# Patient Record
Sex: Female | Born: 1955 | Race: White | Hispanic: No | Smoking: Current every day smoker
Health system: Southern US, Community
[De-identification: ages and names within clinical notes are randomized; demographics above are authoritative.]

## PROBLEM LIST (undated history)

## (undated) DIAGNOSIS — J449 Chronic obstructive pulmonary disease, unspecified: Secondary | ICD-10-CM

## (undated) DIAGNOSIS — I10 Essential (primary) hypertension: Secondary | ICD-10-CM

## (undated) DIAGNOSIS — B1920 Unspecified viral hepatitis C without hepatic coma: Secondary | ICD-10-CM

## (undated) HISTORY — PX: OTHER SURGICAL HISTORY: SHX169

## (undated) HISTORY — PX: CHOLECYSTECTOMY: SHX55

---

## 2002-10-17 ENCOUNTER — Emergency Department (HOSPITAL_COMMUNITY): Admission: EM | Admit: 2002-10-17 | Discharge: 2002-10-17 | Payer: Self-pay | Admitting: Emergency Medicine

## 2006-11-24 ENCOUNTER — Inpatient Hospital Stay: Payer: Self-pay | Admitting: Internal Medicine

## 2006-11-24 ENCOUNTER — Other Ambulatory Visit: Payer: Self-pay

## 2010-04-01 ENCOUNTER — Ambulatory Visit: Payer: Self-pay | Admitting: Otolaryngology

## 2010-04-04 ENCOUNTER — Ambulatory Visit: Payer: Self-pay | Admitting: Otolaryngology

## 2010-06-12 ENCOUNTER — Ambulatory Visit: Payer: Self-pay | Admitting: Family Medicine

## 2010-06-17 ENCOUNTER — Ambulatory Visit: Payer: Self-pay

## 2010-07-16 ENCOUNTER — Emergency Department (HOSPITAL_COMMUNITY): Admission: EM | Admit: 2010-07-16 | Discharge: 2010-07-16 | Payer: Self-pay | Admitting: Family Medicine

## 2010-07-16 ENCOUNTER — Emergency Department (HOSPITAL_COMMUNITY): Admission: EM | Admit: 2010-07-16 | Discharge: 2010-07-16 | Payer: Self-pay | Admitting: Emergency Medicine

## 2010-11-13 ENCOUNTER — Ambulatory Visit: Payer: Self-pay | Admitting: Pain Medicine

## 2010-11-19 ENCOUNTER — Ambulatory Visit: Payer: Self-pay | Admitting: Pain Medicine

## 2010-12-18 ENCOUNTER — Ambulatory Visit: Payer: Self-pay | Admitting: Pain Medicine

## 2010-12-24 ENCOUNTER — Ambulatory Visit: Payer: Self-pay | Admitting: Pain Medicine

## 2011-01-07 ENCOUNTER — Ambulatory Visit: Payer: Self-pay | Admitting: Pain Medicine

## 2011-01-16 ENCOUNTER — Ambulatory Visit: Payer: Self-pay | Admitting: Pain Medicine

## 2011-02-21 LAB — COMPREHENSIVE METABOLIC PANEL
ALT: 20 U/L (ref 0–35)
AST: 21 U/L (ref 0–37)
Alkaline Phosphatase: 66 U/L (ref 39–117)
CO2: 27 mEq/L (ref 19–32)
Chloride: 104 mEq/L (ref 96–112)
GFR calc Af Amer: >60 mL/min (ref >60)
GFR calc non Af Amer: >60 mL/min (ref >60)
Glucose, Bld: 92 mg/dL (ref 70–99)
Potassium: 3.6 mEq/L (ref 3.5–5.1)
Sodium: 139 mEq/L (ref 135–145)

## 2011-02-21 LAB — RAPID URINE DRUG SCREEN, HOSP PERFORMED
Amphetamines: NOT DETECTED
Barbiturates: NOT DETECTED
Opiates: NOT DETECTED
Tetrahydrocannabinol: NOT DETECTED

## 2011-02-21 LAB — DIFFERENTIAL
Basophils Relative: 0 % (ref 0–1)
Eosinophils Absolute: 0.1 10*3/uL (ref 0.0–0.7)
Eosinophils Relative: 1 % (ref 0–5)
Neutrophils Relative %: 67 % (ref 43–77)

## 2011-02-21 LAB — CBC
HCT: 42.4 % (ref 36.0–46.0)
Hemoglobin: 14.6 g/dL (ref 12.0–15.0)
RBC: 4.43 MIL/uL (ref 3.87–5.11)
WBC: 5.1 10*3/uL (ref 4.0–10.5)

## 2011-02-21 LAB — ETHANOL: Alcohol, Ethyl (B): <5 mg/dL (ref 0–10)

## 2011-05-21 ENCOUNTER — Ambulatory Visit: Payer: Self-pay | Admitting: Pain Medicine

## 2011-05-22 ENCOUNTER — Ambulatory Visit: Payer: Self-pay | Admitting: Pain Medicine

## 2011-06-09 ENCOUNTER — Ambulatory Visit: Payer: Self-pay | Admitting: Pain Medicine

## 2011-07-07 ENCOUNTER — Ambulatory Visit: Payer: Self-pay | Admitting: Pain Medicine

## 2011-07-08 ENCOUNTER — Ambulatory Visit: Payer: Self-pay | Admitting: Pain Medicine

## 2011-08-04 ENCOUNTER — Ambulatory Visit: Payer: Self-pay | Admitting: Pain Medicine

## 2011-09-22 ENCOUNTER — Ambulatory Visit: Payer: Self-pay | Admitting: Pain Medicine

## 2011-09-29 ENCOUNTER — Other Ambulatory Visit: Payer: Self-pay | Admitting: Pain Medicine

## 2011-10-22 ENCOUNTER — Ambulatory Visit: Payer: Self-pay | Admitting: Pain Medicine

## 2011-10-23 ENCOUNTER — Ambulatory Visit: Payer: Self-pay | Admitting: Pain Medicine

## 2011-11-03 ENCOUNTER — Emergency Department: Payer: Self-pay | Admitting: Internal Medicine

## 2011-11-12 ENCOUNTER — Ambulatory Visit: Payer: Self-pay | Admitting: Pain Medicine

## 2011-11-13 ENCOUNTER — Ambulatory Visit: Payer: Self-pay | Admitting: Pain Medicine

## 2012-01-07 ENCOUNTER — Ambulatory Visit: Payer: Self-pay | Admitting: Pain Medicine

## 2012-01-15 ENCOUNTER — Ambulatory Visit: Payer: Self-pay | Admitting: Pain Medicine

## 2012-02-11 ENCOUNTER — Ambulatory Visit: Payer: Self-pay | Admitting: Pain Medicine

## 2012-02-18 ENCOUNTER — Emergency Department: Payer: Self-pay

## 2012-06-12 ENCOUNTER — Emergency Department: Payer: Self-pay | Admitting: Emergency Medicine

## 2012-06-12 LAB — COMPREHENSIVE METABOLIC PANEL
Albumin: 4.3 g/dL (ref 3.4–5.0)
Anion Gap: 5 — ABNORMAL LOW (ref 7–16)
Glucose: 105 mg/dL — ABNORMAL HIGH (ref 65–99)
Potassium: 2.6 mmol/L — ABNORMAL LOW (ref 3.5–5.1)
SGPT (ALT): 26 U/L
Sodium: 137 mmol/L (ref 136–145)
Total Protein: 8.2 g/dL (ref 6.4–8.2)

## 2012-06-12 LAB — TSH: Thyroid Stimulating Horm: 1.3 u[IU]/mL

## 2012-06-12 LAB — SALICYLATE LEVEL: Salicylates, Serum: 3.7 mg/dL — ABNORMAL HIGH

## 2012-06-12 LAB — CBC
MCH: 32.5 pg (ref 26.0–34.0)
RBC: 4.63 10*6/uL (ref 3.80–5.20)

## 2012-06-13 LAB — URINALYSIS, COMPLETE
Bacteria: NONE SEEN
Bilirubin,UR: NEGATIVE
Blood: NEGATIVE
Glucose,UR: NEGATIVE mg/dL (ref 0–75)
Ph: 6 (ref 4.5–8.0)
Squamous Epithelial: 2

## 2012-06-13 LAB — DRUG SCREEN, URINE
Barbiturates, Ur Screen: NEGATIVE (ref ?–200)
Cannabinoid 50 Ng, Ur ~~LOC~~: NEGATIVE (ref ?–50)
Cocaine Metabolite,Ur ~~LOC~~: POSITIVE (ref ?–300)
MDMA (Ecstasy)Ur Screen: NEGATIVE (ref ?–500)
Opiate, Ur Screen: NEGATIVE (ref ?–300)
Tricyclic, Ur Screen: NEGATIVE (ref ?–1000)

## 2012-12-30 ENCOUNTER — Emergency Department: Payer: Self-pay | Admitting: Internal Medicine

## 2012-12-30 LAB — COMPREHENSIVE METABOLIC PANEL
Albumin: 3.6 g/dL (ref 3.4–5.0)
Anion Gap: 5 — ABNORMAL LOW (ref 7–16)
BUN: 7 mg/dL (ref 7–18)
Bilirubin,Total: 0.4 mg/dL (ref 0.2–1.0)
Chloride: 102 mmol/L (ref 98–107)
Creatinine: 0.65 mg/dL (ref 0.60–1.30)
SGPT (ALT): 19 U/L (ref 12–78)
Sodium: 136 mmol/L (ref 136–145)
Total Protein: 7.8 g/dL (ref 6.4–8.2)

## 2012-12-30 LAB — DRUG SCREEN, URINE
Benzodiazepine, Ur Scrn: NEGATIVE (ref ?–200)
Cannabinoid 50 Ng, Ur ~~LOC~~: NEGATIVE (ref ?–50)
Cocaine Metabolite,Ur ~~LOC~~: POSITIVE (ref ?–300)
MDMA (Ecstasy)Ur Screen: NEGATIVE (ref ?–500)
Methadone, Ur Screen: NEGATIVE (ref ?–300)

## 2012-12-30 LAB — URINALYSIS, COMPLETE
Bilirubin,UR: NEGATIVE
Glucose,UR: NEGATIVE mg/dL (ref 0–75)
Leukocyte Esterase: NEGATIVE
Nitrite: NEGATIVE
RBC,UR: 1 /HPF (ref 0–5)
WBC UR: 1 /HPF (ref 0–5)

## 2012-12-30 LAB — TROPONIN I: Troponin-I: <0.02 ng/mL

## 2012-12-30 LAB — CBC
HGB: 14 g/dL (ref 12.0–16.0)
Platelet: 180 10*3/uL (ref 150–440)
WBC: 6 10*3/uL (ref 3.6–11.0)

## 2013-01-24 ENCOUNTER — Ambulatory Visit: Payer: Self-pay | Admitting: Otolaryngology

## 2013-01-25 ENCOUNTER — Ambulatory Visit: Payer: Self-pay | Admitting: Otolaryngology

## 2013-01-27 ENCOUNTER — Ambulatory Visit: Payer: Self-pay | Admitting: Otolaryngology

## 2013-01-27 LAB — DRUG SCREEN, URINE
Benzodiazepine, Ur Scrn: NEGATIVE (ref ?–200)
MDMA (Ecstasy)Ur Screen: POSITIVE (ref ?–500)
Methadone, Ur Screen: NEGATIVE (ref ?–300)
Phencyclidine (PCP) Ur S: NEGATIVE (ref ?–25)
Tricyclic, Ur Screen: NEGATIVE (ref ?–1000)

## 2014-03-13 ENCOUNTER — Inpatient Hospital Stay: Payer: Self-pay | Admitting: Internal Medicine

## 2014-03-13 LAB — COMPREHENSIVE METABOLIC PANEL
ALT: 137 U/L — AB (ref 12–78)
AST: 224 U/L — AB (ref 15–37)
Albumin: 3.3 g/dL — ABNORMAL LOW (ref 3.4–5.0)
Alkaline Phosphatase: 165 U/L — ABNORMAL HIGH
Anion Gap: 3 — ABNORMAL LOW (ref 7–16)
BUN: 7 mg/dL (ref 7–18)
Bilirubin,Total: 0.7 mg/dL (ref 0.2–1.0)
Calcium, Total: 8.3 mg/dL — ABNORMAL LOW (ref 8.5–10.1)
Chloride: 102 mmol/L (ref 98–107)
Co2: 31 mmol/L (ref 21–32)
Creatinine: 0.73 mg/dL (ref 0.60–1.30)
GLUCOSE: 110 mg/dL — AB (ref 65–99)
Osmolality: 271 (ref 275–301)
Potassium: 3.7 mmol/L (ref 3.5–5.1)
Sodium: 136 mmol/L (ref 136–145)
TOTAL PROTEIN: 8.4 g/dL — AB (ref 6.4–8.2)

## 2014-03-13 LAB — URINALYSIS, COMPLETE
BILIRUBIN, UR: NEGATIVE
Glucose,UR: NEGATIVE mg/dL (ref 0–75)
KETONE: NEGATIVE
NITRITE: POSITIVE
Ph: 8 (ref 4.5–8.0)
Protein: NEGATIVE
RBC,UR: 23 /HPF (ref 0–5)
SPECIFIC GRAVITY: 1.012 (ref 1.003–1.030)

## 2014-03-13 LAB — CBC
HCT: 41.7 % (ref 35.0–47.0)
HGB: 13.7 g/dL (ref 12.0–16.0)
MCH: 33.4 pg (ref 26.0–34.0)
MCHC: 32.8 g/dL (ref 32.0–36.0)
MCV: 102 fL — AB (ref 80–100)
Platelet: 100 10*3/uL — ABNORMAL LOW (ref 150–440)
RBC: 4.1 10*6/uL (ref 3.80–5.20)
RDW: 15.1 % — AB (ref 11.5–14.5)
WBC: 4.2 10*3/uL (ref 3.6–11.0)

## 2014-03-13 LAB — AMMONIA: Ammonia, Plasma: 39 mcmol/L — ABNORMAL HIGH (ref 11–32)

## 2014-03-13 LAB — DRUG SCREEN, URINE
AMPHETAMINES, UR SCREEN: NEGATIVE (ref ?–1000)
BARBITURATES, UR SCREEN: NEGATIVE (ref ?–200)
BENZODIAZEPINE, UR SCRN: POSITIVE (ref ?–200)
COCAINE METABOLITE, UR ~~LOC~~: POSITIVE (ref ?–300)
Cannabinoid 50 Ng, Ur ~~LOC~~: NEGATIVE (ref ?–50)
MDMA (ECSTASY) UR SCREEN: NEGATIVE (ref ?–500)
METHADONE, UR SCREEN: NEGATIVE (ref ?–300)
Opiate, Ur Screen: NEGATIVE (ref ?–300)
PHENCYCLIDINE (PCP) UR S: NEGATIVE (ref ?–25)
Tricyclic, Ur Screen: NEGATIVE (ref ?–1000)

## 2014-03-13 LAB — LIPASE, BLOOD: LIPASE: 230 U/L (ref 73–393)

## 2014-03-13 LAB — TROPONIN I: Troponin-I: <0.02 ng/mL

## 2014-03-14 LAB — HEPATIC FUNCTION PANEL A (ARMC)
ALBUMIN: 2.3 g/dL — AB (ref 3.4–5.0)
ALK PHOS: 106 U/L
ALT: 84 U/L — AB (ref 12–78)
AST: 119 U/L — AB (ref 15–37)
BILIRUBIN TOTAL: 0.6 mg/dL (ref 0.2–1.0)
Bilirubin, Direct: 0.2 mg/dL (ref 0.00–0.20)
TOTAL PROTEIN: 5.8 g/dL — AB (ref 6.4–8.2)

## 2014-03-15 LAB — URINE CULTURE

## 2015-03-31 NOTE — H&P (Signed)
PATIENT NAME:  Kylie Monroe, SWANGO MR#:  [id number] DATE OF BIRTH:  08-06-1956  DATE OF ADMISSION:  03/13/2014  PRIMARY CARE PHYSICIAN:  Marguerita Merles, MD  CHIEF COMPLAINT: Abdominal pain.   HISTORY OF PRESENT ILLNESS: This is a 59 year old female who presents to the hospital complaining of lower abdominal pain. She also, on arrival to the ER, was obtunded and lethargic. The patient describes her pain as being achy in nature. She has had it for the past 3 to 4 days. She denies any diarrhea. She admits to some mild nausea and vomiting. She came to the ER and was noted to have a urinary tract infection. A CT scan did show evidence of portal hypertension and liver cirrhosis. The patient is still quite lethargic and is a very poor historian, therefore, most of the history obtained from the chart. Given the fact that she remains quite encephalopathic and was noted to have a urinary tract infection, hospitalist services were contacted for further treatment and evaluation.   REVIEW OF SYSTEMS:    CONSTITUTIONAL: No documented fever. No weight gain, no weight loss.  EYES: No blurred or double vision.  ENT: No tinnitus. No postnasal drip. No redness of the oropharynx.  RESPIRATORY: No cough, no wheeze, no hemoptysis, no dyspnea.  CARDIOVASCULAR: No chest pain. No orthopnea, no palpitations, no syncope.  GASTROINTESTINAL: Positive nausea. Positive vomiting. No diarrhea. Positive abdominal pain. No melena or hematochezia.  GENITOURINARY: No dysuria. No hematuria.  ENDOCRINE: No polyuria or nocturia. No heat or cold intolerance. HEMATOLOGIC:  No anemia, no bruising, no bleeding.  INTEGUMENTARY: No rashes. No lesions.  MUSCULOSKELETAL: No arthritis. No swelling. No gout.  NEUROLOGIC: No numbness or tingling. No ataxia. No seizure-type activity.  PSYCHIATRIC:  No anxiety, no insomnia, no ADD. Positive bipolar disorder.   PAST MEDICAL HISTORY: Consistent with a history of bipolar disorder, cocaine abuse,  history of hep C, COPD, depression, panic attacks, substance abuse, on Suboxone.   ALLERGIES: No known drug allergies.   SOCIAL HISTORY: Still smokes occasionally. Does have a 20 to 30 pack-year smoking history. Does have a history of alcohol abuse, says she quit many years ago. Also has a history of polysubstance abuse like cocaine and marijuana but says she has not done it in quite a while.   FAMILY HISTORY: Mother and father are both deceased. Mother died from throat cancer. She cannot recall what her father died from.   CURRENT MEDICATIONS: Are as follows: Albuterol inhaler 2 puffs 4 times daily as needed, bupropion 300 mg 3 tabs daily, Suboxone 8 mg/2 mg sublingual t.i.d., trazodone 100 mg at bedtime and Trileptal 300 mg b.i.d.   PHYSICAL EXAMINATION: Presently is as follows:  VITAL SIGNS:  Temperature is 98.1, pulse 69, respirations 16, blood pressure 148/80, sats 96% on room air.  GENERAL: She is a very unkempt, lethargic female in bed but in no apparent distress.  HEENT:  She is atraumatic, normocephalic. Her extraocular muscles are intact. Pupils are equal and reactive to light. Sclerae anicteric. No conjunctival injection. No pharyngeal erythema.  NECK: Supple. There is no jugular venous distention. No bruits, no lymphadenopathy, no thyromegaly.  HEART: Regular rate and rhythm. No murmurs. No rubs. No clicks.  LUNGS: Clear to auscultation bilaterally. No rales or rhonchi. No wheezes.  ABDOMEN: Soft, flat, tender diffusely but no rebound or rigidity. Good bowel sounds. No hepatosplenomegaly appreciated.  EXTREMITIES: No evidence of any cyanosis, clubbing or peripheral edema. Has +2 pedal and radial pulses bilaterally.  NEUROLOGICAL: The  patient is alert, awake, and oriented x 2, globally weak, moves all extremities spontaneously. No other focal motor or sensory deficits appreciated bilaterally.  SKIN: Moist and warm with no rashes appreciated.  LYMPHATIC: There is no cervical or  axillary lymphadenopathy.   LABORATORY, DIAGNOSTIC AND RADIOLOGICAL DATA:    1.  Serum glucose 110, BUN 7, creatinine 0.7, sodium 136, potassium 3.7, chloride 102, bicarb 31. LFTs showed an albumin of 3.28, alk phos 165, AST 224, ALT 137, total protein of 8.4. White cell count 4.2, hemoglobin 13.7, hematocrit 41.7, platelet count 100. Troponin less than 0.02. The patient's urinalysis showed positive nitrites, trace leukocyte esterase with 5 white cells and trace bacteria.  2.  The patient did have a CT of the abdomen and pelvis done showing liver shows morphologic changes of cirrhosis, splenomegaly, small amount of ascites, portal venous hypertension, significant dilatation of the intra- and extrahepatic biliary tree. This may be chronic; mildly increased stool throughout the colon.   ASSESSMENT AND PLAN: A 59 year old female with a history of bipolar disorder, cocaine abuse, history of hepatitis C, chronic obstructive pulmonary disease, depression, panic attacks, substance abuse on Suboxone, presents to the hospital due to lower abdominal pain and also noted to be quite lethargic and altered.  1.  Altered mental status/encephalopathy. This is likely metabolic encephalopathy from urinary tract infection combined with questionable underlying substance abuse. For now, we will treat the urinary tract infection with IV ceftriaxone and follow her mental status. She does have a history of alcohol abuse but I will go ahead and check an alcohol level. Also check an ammonia level and a urine drug screen. The patient has a history of substance abuse and very high drug-seeking behavior, therefore, I will not order any pain meds for now.  2.  Abdominal pain and abnormal LFTs. The exact etiology of this is unclear but suspected to be related to underlying hepatitis C. The patient's CT abdomen did show a liver cirrhosis and portal hypertension. Her LFTs are mildly abnormal but her lipase is normal. I will check an alcohol  level even though the patient says she has not been drinking for years. Her LFTs could be chronically elevated due to her hepatitis C.  I will check an ammonia level and hold off giving her any pain meds as she has high drug-seeking behavior and has seen pain management in the past.  3.  Urinary tract infection. Continue IV ceftriaxone, follow urine culture.  4.  History of polysubstance abuse. Continue with the Suboxone.  Check a urine drug screen.  5.  History of chronic obstructive pulmonary disease. No acute exacerbation. Continue p.r.n. DuoNebs.  6.  Bipolar disorder. Continue with her Trileptal, trazodone and Wellbutrin.  7.  CODE STATUS: The patient is a full code.   TIME SPENT: 50 minutes.    ____________________________ Belia Heman. Verdell Carmine, MD vjs:cs D: 03/13/2014 19:09:06 ET T: 03/13/2014 19:30:57 ET JOB#: [id number]  cc: Belia Heman. Verdell Carmine, MD, <Dictator> Henreitta Leber MD ELECTRONICALLY SIGNED 03/19/2014 18:47
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
## 2015-03-31 NOTE — Discharge Summary (Signed)
PATIENT NAME:  Kylie Monroe, Kylie Monroe MR#:  [id number] DATE OF BIRTH:  March 10, 1956  DATE OF ADMISSION:  03/13/2014 DATE OF DISCHARGE:  03/14/2014  ADMITTING PHYSICIAN:  Belia Heman. Verdell Carmine, MD  DISCHARGING PHYSICIAN:  Gladstone Lighter, MD  PRIMARY CARE PHYSICIAN:  Myrle Sheng. Jimmye Norman, [place]:  Psychiatric consultation by Jolanta B. Bary Leriche, MD.  DISCHARGE DIAGNOSES: 1.  Altered mental status, likely toxic metabolic encephalopathy from polysubstance abuse.  2.  Acute on chronic abdominal pain.  3.  Urinary tract infection.  4.  History of opioid abuse, on Suboxone.  5.  Narcotic drug-seeking behavior.  6.  Chronic obstructive pulmonary disease.  7.  Bipolar disorder.  8.  Hypotension while in the hospital.    LABORATORY, DIAGNOSTIC AND RADIOLOGICAL DATA PRIOR TO DISCHARGE:  1.  Urine cultures growing pansensitive E. coli. Urine tox screen positive for cocaine and benzos.   2.  CT of the abdomen and pelvis showing cirrhotic acute changes with splenomegaly, mild ascites, portal venous hypertension changes, dilatation of intrahepatic and extrahepatic biliary tree noted.  3.  ALT 84, AST 119, total bili 0.6, alk phos 106, albumin 2.3.  4.  WBC 4.2, hemoglobin 13.7, hematocrit 41.7, platelet count 100.  5.  Sodium 136, potassium 3.7, chloride 102, bicarb 31, BUN 7, creatinine 0.73, glucose 110 and calcium of 8.3.   BRIEF HOSPITAL COURSE: [name] is a 59 year old Caucasian female with past medical history significant for liver cirrhosis, rectal prolapse status post surgery, who has chronic abdominal pain, history of polysubstance abuse and narcotic drug-seeking behavior, comes with acute on chronic abdominal pain. The patient was also noted to be very lethargic and urine tox screen was positive for cocaine and benzos.  Toxic metabolic encephalopathy. The patient likely had some polysubstance abuse when she came in as ammonia level was okay and urine tox screen was positive  for cocaine and benzos. She is on Suboxone at home. Her mental status was starting to clear up, she was more alert.  She was hypotensive, not sure if it was from infection or from her drug use. Blood pressure improved with fluids. The patient was on Rocephin for her UTI and she was on pain medication, not IV narcotics, for her acute on chronic abdominal pain; however, once the patient woke up she wanted to leave against medical advise. She was evaluated by psychiatrist, who recommended to continue her Suboxone, Trileptal and trazodone for her bipolar and opioid dependence. The patient denied any suicidal thoughts so she is not on any commitment so she left AGAINST MEDICAL ADVICE on March 14, 2014.   ADDITIONAL TIME SPENT: 35 minutes.    ____________________________ Gladstone Lighter, MD rk:cs D: 03/15/2014 14:07:16 ET T: 03/15/2014 14:38:42 ET JOB#: [id number]  cc: Gladstone Lighter, MD, <Dictator> Gladstone Lighter MD ELECTRONICALLY SIGNED 03/16/2014 14:08
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
## 2015-11-06 ENCOUNTER — Encounter: Payer: Self-pay | Admitting: Emergency Medicine

## 2015-11-06 DIAGNOSIS — I10 Essential (primary) hypertension: Secondary | ICD-10-CM | POA: Diagnosis not present

## 2015-11-06 DIAGNOSIS — F17210 Nicotine dependence, cigarettes, uncomplicated: Secondary | ICD-10-CM | POA: Diagnosis not present

## 2015-11-06 DIAGNOSIS — M79672 Pain in left foot: Secondary | ICD-10-CM | POA: Insufficient documentation

## 2015-11-06 LAB — CBC WITH DIFFERENTIAL/PLATELET
BASOS ABS: 0 10*3/uL (ref 0–0.1)
BASOS PCT: 0 %
EOS PCT: 2 %
Eosinophils Absolute: 0 10*3/uL (ref 0–0.7)
HCT: 33.3 % — ABNORMAL LOW (ref 35.0–47.0)
Hemoglobin: 10.8 g/dL — ABNORMAL LOW (ref 12.0–16.0)
LYMPHS PCT: 20 %
Lymphs Abs: 0.7 10*3/uL — ABNORMAL LOW (ref 1.0–3.6)
MCH: 31.2 pg (ref 26.0–34.0)
MCHC: 32.5 g/dL (ref 32.0–36.0)
MCV: 96.1 fL (ref 80.0–100.0)
MONO ABS: 0.3 10*3/uL (ref 0.2–0.9)
Monocytes Relative: 9 %
Neutro Abs: 2.4 10*3/uL (ref 1.4–6.5)
Neutrophils Relative %: 69 %
PLATELETS: 159 10*3/uL (ref 150–440)
RBC: 3.46 MIL/uL — ABNORMAL LOW (ref 3.80–5.20)
RDW: 14.2 % (ref 11.5–14.5)
WBC: 3.4 10*3/uL — ABNORMAL LOW (ref 3.6–11.0)

## 2015-11-06 LAB — COMPREHENSIVE METABOLIC PANEL
ALT: 49 U/L (ref 14–54)
ANION GAP: 5 (ref 5–15)
AST: 127 U/L — AB (ref 15–41)
Albumin: 3.1 g/dL — ABNORMAL LOW (ref 3.5–5.0)
Alkaline Phosphatase: 91 U/L (ref 38–126)
BUN: 11 mg/dL (ref 6–20)
CHLORIDE: 98 mmol/L — AB (ref 101–111)
CO2: 33 mmol/L — ABNORMAL HIGH (ref 22–32)
Calcium: 8.6 mg/dL — ABNORMAL LOW (ref 8.9–10.3)
Creatinine, Ser: 0.75 mg/dL (ref 0.44–1.00)
GFR calc Af Amer: >60 mL/min (ref >60)
Glucose, Bld: 90 mg/dL (ref 65–99)
POTASSIUM: 3.6 mmol/L (ref 3.5–5.1)
Sodium: 136 mmol/L (ref 135–145)
Total Bilirubin: 0.4 mg/dL (ref 0.3–1.2)
Total Protein: 8.1 g/dL (ref 6.5–8.1)

## 2015-11-06 NOTE — ED Notes (Signed)
Left foot pain and swelling.  Onset of symptoms x 3 days.  Denies injury.   Redened area seet to left lateral foot.  Dorsalis Pedis pulse +1 palpable.  + CMS to extremity.

## 2015-11-07 ENCOUNTER — Emergency Department
Admission: EM | Admit: 2015-11-07 | Discharge: 2015-11-07 | Discharge status: Against Medical Advice | Payer: Medicaid Other | Attending: Emergency Medicine | Admitting: Emergency Medicine

## 2015-11-07 HISTORY — DX: Unspecified viral hepatitis C without hepatic coma: B19.20

## 2015-11-07 HISTORY — DX: Chronic obstructive pulmonary disease, unspecified: J44.9

## 2015-11-07 HISTORY — DX: Essential (primary) hypertension: I10

## 2015-11-08 ENCOUNTER — Telehealth: Payer: Self-pay | Admitting: Emergency Medicine
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
## 2015-12-05 ENCOUNTER — Other Ambulatory Visit: Payer: Self-pay | Admitting: Nurse Practitioner

## 2015-12-05 ENCOUNTER — Ambulatory Visit
Admission: RE | Admit: 2015-12-05 | Discharge: 2015-12-05 | Disposition: A | Discharge status: Home / Self Care | Payer: Medicaid Other | Source: Ambulatory Visit | Attending: Nurse Practitioner | Admitting: Nurse Practitioner

## 2015-12-05 DIAGNOSIS — R600 Localized edema: Secondary | ICD-10-CM

## 2015-12-06 ENCOUNTER — Emergency Department
Admission: EM | Admit: 2015-12-06 | Discharge: 2015-12-06 | Discharge status: Against Medical Advice | Payer: Medicaid Other | Attending: Emergency Medicine | Admitting: Emergency Medicine

## 2015-12-06 ENCOUNTER — Emergency Department: Payer: Medicaid Other

## 2015-12-06 DIAGNOSIS — F17210 Nicotine dependence, cigarettes, uncomplicated: Secondary | ICD-10-CM | POA: Diagnosis not present

## 2015-12-06 DIAGNOSIS — R2242 Localized swelling, mass and lump, left lower limb: Secondary | ICD-10-CM | POA: Diagnosis not present

## 2015-12-06 DIAGNOSIS — I10 Essential (primary) hypertension: Secondary | ICD-10-CM | POA: Diagnosis not present

## 2015-12-06 DIAGNOSIS — R079 Chest pain, unspecified: Secondary | ICD-10-CM

## 2015-12-06 LAB — COMPREHENSIVE METABOLIC PANEL
ALBUMIN: 3.1 g/dL — AB (ref 3.5–5.0)
ALT: 57 U/L — ABNORMAL HIGH (ref 14–54)
ANION GAP: 4 — AB (ref 5–15)
AST: 128 U/L — ABNORMAL HIGH (ref 15–41)
Alkaline Phosphatase: 122 U/L (ref 38–126)
BILIRUBIN TOTAL: 0.6 mg/dL (ref 0.3–1.2)
BUN: 14 mg/dL (ref 6–20)
CALCIUM: 8.5 mg/dL — AB (ref 8.9–10.3)
CO2: 27 mmol/L (ref 22–32)
Chloride: 102 mmol/L (ref 101–111)
Creatinine, Ser: 0.78 mg/dL (ref 0.44–1.00)
GFR calc non Af Amer: >60 mL/min (ref >60)
GLUCOSE: 104 mg/dL — AB (ref 65–99)
POTASSIUM: 3.8 mmol/L (ref 3.5–5.1)
SODIUM: 133 mmol/L — AB (ref 135–145)
TOTAL PROTEIN: 8 g/dL (ref 6.5–8.1)

## 2015-12-06 LAB — CBC
HCT: 33.9 % — ABNORMAL LOW (ref 35.0–47.0)
Hemoglobin: 11.2 g/dL — ABNORMAL LOW (ref 12.0–16.0)
MCH: 31.4 pg (ref 26.0–34.0)
MCHC: 33.1 g/dL (ref 32.0–36.0)
MCV: 94.7 fL (ref 80.0–100.0)
Platelets: 91 10*3/uL — ABNORMAL LOW (ref 150–440)
RBC: 3.58 MIL/uL — AB (ref 3.80–5.20)
RDW: 15.7 % — AB (ref 11.5–14.5)
WBC: 3.4 10*3/uL — AB (ref 3.6–11.0)

## 2015-12-06 LAB — TROPONIN I: Troponin I: <0.03 ng/mL (ref <0.031)

## 2015-12-06 MED ORDER — IOHEXOL 350 MG/ML SOLN
100.0000 mL | Freq: Once | INTRAVENOUS | Status: AC | PRN
Start: 2015-12-06 — End: 2015-12-06
  Administered 2015-12-06: 100 mL via INTRAVENOUS

## 2015-12-06 NOTE — ED Notes (Signed)
This RN called from treatment area asking to stop patient at the front desk due to her leaving AMA at this time; patient still has PIV in place. RN to back to locate patient. Patient observed in hall walking towards flex with visitor and primary nurse Erie Noe(Vanessa, RN). Patient cursing and talking about how we (staff) were lazy and stupid; "ya'll cant even read a damn piece of paper". Erie NoeVanessa, RN advised this RN that patient was leaving, however she is refusing to allow PIV to be removed. This RN began walking towards as patient proceeded to push up her sleeve and literally ripped PIV from her arm and throw it in the floor. Patient states, "Since ya'll are so lazy. Here let me help you."  Patient bleeding profusely; blood in floor and running down arm. Patient proceeded to pull the tape and TWD off and threw it in the floor as well. This RN asked patient it she would allow a pressure dressing to be applied. RN applied DSPD, however patient would not allow this RN to apply pressure. Patient continues to curse and berate staff talking about us being "lazy and stupid". RN encouraged patient to either allow this RN to apply pressure, or she would need to apply pressure her self. Patient states, "It will be fine" and she jerks sleeve back down and proceeds to walk away. This RN obtained PDI wipes and cleaned blood in floor and walls; PIV catheter collected and inspected by this RN; catheter found to be fully intact. This RN left incident to dispose of biohazardous material. Patient left with Erie NoeVanessa, Charity fundraiserN. Still cursing as this RN walked away.
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
## 2017-12-17 NOTE — Progress Notes (Deleted)
  Hematology/Oncology Consult note [facility] [phone] [phone]   Patient Care Team: Leanna SatoMiles, Linda M, MD as PCP - General (Family Medicine)  REFERRING PROVIDER:  CHIEF COMPLAINTS/PURPOSE OF CONSULTATION:    HISTORY OF PRESENTING ILLNESS:  Kylie Monroe is a  62 y.o.  female with PMH listed below who was referred to me for evaluation of   Reviewed patient's lab work that was done at her PCP's office. 12/09/2017 WBC 2.1, hemoglobin 7.4 hematocrit 25.8 MCV 84 platelet count 84,000  ANC1.28 lymphocyte 0.5, monocytes 0.3, he also had a CMP done which showed normal creatinine 0.6, bilirubin 0.4.    MEDICAL HISTORY:  Past Medical History:  Diagnosis Date  . COPD (chronic obstructive pulmonary disease) (HCC)   . Hepatitis C   . Hypertension     SURGICAL HISTORY: Past Surgical History:  Procedure Laterality Date  . CHOLECYSTECTOMY    . prolapsed rectum      SOCIAL HISTORY: Social History   Socioeconomic History  . Marital status: Divorced    Spouse name: Not on file  . Number of children: Not on file  . Years of education: Not on file  . Highest education level: Not on file  Social Needs  . Financial resource strain: Not on file  . Food insecurity - worry: Not on file  . Food insecurity - inability: Not on file  . Transportation needs - medical: Not on file  . Transportation needs - non-medical: Not on file  Occupational History  . Not on file  Tobacco Use  . Smoking status: Current Every Day Smoker    Packs/day: 1.00    Types: Cigarettes  . Smokeless tobacco: Never Used  Substance and Sexual Activity  . Alcohol use: No  . Drug use: Yes    Types: Cocaine    Comment: valium  . Sexual activity: Not on file  Other Topics Concern  . Not on file  Social History Narrative  . Not on file    FAMILY HISTORY: No family history on file.  ALLERGIES:  has No Known Allergies.  MEDICATIONS:  No current outpatient  medications on file.   No current facility-administered medications for this visit.      PHYSICAL EXAMINATION: ECOG PERFORMANCE STATUS: {CHL ONC ECOG PS:[id number]} There were no vitals filed for this visit. There were no vitals filed for this visit.  Physical Exam   LABORATORY DATA:  I have reviewed the data as listed Lab Results  Component Value Date   WBC 3.4 (L) 12/06/2015   HGB 11.2 (L) 12/06/2015   HCT 33.9 (L) 12/06/2015   MCV 94.7 12/06/2015   PLT 91 (L) 12/06/2015   No results for input(s): NA, K, CL, CO2, GLUCOSE, BUN, CREATININE, CALCIUM, GFRNONAA, GFRAA, PROT, ALBUMIN, AST, ALT, ALKPHOS, BILITOT, BILIDIR, IBILI in the last 8760 hours.     ASSESSMENT & PLAN:  1. Thrombocytopenia (HCC)      All questions were answered. The patient knows to call the clinic with any problems questions or concerns.  Return of visit:  Thank you for this kind referral and the opportunity to participate in the care of this patient. A copy of today's note is routed to referring provider    Rickard PatienceZhou Dean Goldner, MD, PhD Hematology Oncology [facility] Pager- [phone] 12/17/2017

## 2017-12-18 ENCOUNTER — Inpatient Hospital Stay: Payer: Medicaid Other | Admitting: Oncology

## 2017-12-21 NOTE — Progress Notes (Deleted)
  Hematology/Oncology Consult note [facility] [phone] [phone]   Patient Care Team: Leanna SatoMiles, Linda M, MD as PCP - General (Family Medicine)  REFERRING PROVIDER:  CHIEF COMPLAINTS/PURPOSE OF CONSULTATION:    HISTORY OF PRESENTING ILLNESS:  Kylie Monroe is a  62 y.o.  female with PMH listed below who was referred to me for evaluation of     ROS  MEDICAL HISTORY:  Past Medical History:  Diagnosis Date  . COPD (chronic obstructive pulmonary disease) (HCC)   . Hepatitis C   . Hypertension     SURGICAL HISTORY: Past Surgical History:  Procedure Laterality Date  . CHOLECYSTECTOMY    . prolapsed rectum      SOCIAL HISTORY: Social History   Socioeconomic History  . Marital status: Divorced    Spouse name: Not on file  . Number of children: Not on file  . Years of education: Not on file  . Highest education level: Not on file  Social Needs  . Financial resource strain: Not on file  . Food insecurity - worry: Not on file  . Food insecurity - inability: Not on file  . Transportation needs - medical: Not on file  . Transportation needs - non-medical: Not on file  Occupational History  . Not on file  Tobacco Use  . Smoking status: Current Every Day Smoker    Packs/day: 1.00    Types: Cigarettes  . Smokeless tobacco: Never Used  Substance and Sexual Activity  . Alcohol use: No  . Drug use: Yes    Types: Cocaine    Comment: valium  . Sexual activity: Not on file  Other Topics Concern  . Not on file  Social History Narrative  . Not on file    FAMILY HISTORY: No family history on file.  ALLERGIES:  has No Known Allergies.  MEDICATIONS:  No current outpatient medications on file.   No current facility-administered medications for this visit.      PHYSICAL EXAMINATION: ECOG PERFORMANCE STATUS: {CHL ONC ECOG [id number]} There were no vitals filed for this visit. There were no vitals filed for this  visit.  Physical Exam   LABORATORY DATA:  I have reviewed the data as listed Lab Results  Component Value Date   WBC 3.4 (L) 12/06/2015   HGB 11.2 (L) 12/06/2015   HCT 33.9 (L) 12/06/2015   MCV 94.7 12/06/2015   PLT 91 (L) 12/06/2015   No results for input(s): NA, K, CL, CO2, GLUCOSE, BUN, CREATININE, CALCIUM, GFRNONAA, GFRAA, PROT, ALBUMIN, AST, ALT, ALKPHOS, BILITOT, BILIDIR, IBILI in the last 8760 hours.     ASSESSMENT & PLAN:  No diagnosis found.   All questions were answered. The patient knows to call the clinic with any problems questions or concerns.  Return of visit:  Thank you for this kind referral and the opportunity to participate in the care of this patient. A copy of today's note is routed to referring provider    Rickard PatienceZhou Jurnie Garritano, MD, PhD Hematology Oncology [facility] Pager- [phone] 12/21/2017

## 2017-12-22 ENCOUNTER — Inpatient Hospital Stay: Payer: Medicaid Other | Attending: Oncology | Admitting: Oncology

## 2017-12-22 DIAGNOSIS — R630 Anorexia: Secondary | ICD-10-CM | POA: Insufficient documentation

## 2017-12-22 DIAGNOSIS — R6881 Early satiety: Secondary | ICD-10-CM | POA: Insufficient documentation

## 2017-12-22 DIAGNOSIS — R162 Hepatomegaly with splenomegaly, not elsewhere classified: Secondary | ICD-10-CM | POA: Insufficient documentation

## 2017-12-22 DIAGNOSIS — D61818 Other pancytopenia: Secondary | ICD-10-CM | POA: Insufficient documentation

## 2017-12-22 DIAGNOSIS — K7460 Unspecified cirrhosis of liver: Secondary | ICD-10-CM | POA: Insufficient documentation

## 2017-12-22 DIAGNOSIS — F17210 Nicotine dependence, cigarettes, uncomplicated: Secondary | ICD-10-CM | POA: Insufficient documentation

## 2017-12-22 DIAGNOSIS — Z7982 Long term (current) use of aspirin: Secondary | ICD-10-CM | POA: Insufficient documentation

## 2017-12-22 DIAGNOSIS — Z9049 Acquired absence of other specified parts of digestive tract: Secondary | ICD-10-CM | POA: Insufficient documentation

## 2017-12-22 DIAGNOSIS — B1920 Unspecified viral hepatitis C without hepatic coma: Secondary | ICD-10-CM | POA: Insufficient documentation

## 2017-12-22 DIAGNOSIS — M2550 Pain in unspecified joint: Secondary | ICD-10-CM | POA: Insufficient documentation

## 2017-12-22 DIAGNOSIS — R220 Localized swelling, mass and lump, head: Secondary | ICD-10-CM | POA: Insufficient documentation

## 2017-12-22 DIAGNOSIS — F1490 Cocaine use, unspecified, uncomplicated: Secondary | ICD-10-CM | POA: Insufficient documentation

## 2017-12-22 DIAGNOSIS — J449 Chronic obstructive pulmonary disease, unspecified: Secondary | ICD-10-CM | POA: Insufficient documentation

## 2017-12-22 DIAGNOSIS — R05 Cough: Secondary | ICD-10-CM | POA: Insufficient documentation

## 2017-12-22 DIAGNOSIS — D72819 Decreased white blood cell count, unspecified: Secondary | ICD-10-CM | POA: Insufficient documentation

## 2017-12-22 DIAGNOSIS — R5383 Other fatigue: Secondary | ICD-10-CM | POA: Insufficient documentation

## 2017-12-22 DIAGNOSIS — R432 Parageusia: Secondary | ICD-10-CM | POA: Insufficient documentation

## 2017-12-22 DIAGNOSIS — Z79899 Other long term (current) drug therapy: Secondary | ICD-10-CM | POA: Insufficient documentation

## 2017-12-22 DIAGNOSIS — I10 Essential (primary) hypertension: Secondary | ICD-10-CM | POA: Insufficient documentation

## 2017-12-22 DIAGNOSIS — R0602 Shortness of breath: Secondary | ICD-10-CM | POA: Insufficient documentation

## 2017-12-25 ENCOUNTER — Encounter: Payer: Self-pay | Admitting: Hematology and Oncology

## 2017-12-25 ENCOUNTER — Inpatient Hospital Stay (HOSPITAL_BASED_OUTPATIENT_CLINIC_OR_DEPARTMENT_OTHER): Payer: Medicaid Other | Admitting: Hematology and Oncology

## 2017-12-25 ENCOUNTER — Other Ambulatory Visit: Payer: Self-pay

## 2017-12-25 ENCOUNTER — Inpatient Hospital Stay: Payer: Medicaid Other

## 2017-12-25 VITALS — BP 95/62 | HR 88 | Temp 98.0°F | Wt 152.4 lb

## 2017-12-25 DIAGNOSIS — R220 Localized swelling, mass and lump, head: Secondary | ICD-10-CM | POA: Diagnosis not present

## 2017-12-25 DIAGNOSIS — F1490 Cocaine use, unspecified, uncomplicated: Secondary | ICD-10-CM | POA: Diagnosis not present

## 2017-12-25 DIAGNOSIS — K7460 Unspecified cirrhosis of liver: Secondary | ICD-10-CM

## 2017-12-25 DIAGNOSIS — J449 Chronic obstructive pulmonary disease, unspecified: Secondary | ICD-10-CM

## 2017-12-25 DIAGNOSIS — Z7982 Long term (current) use of aspirin: Secondary | ICD-10-CM | POA: Diagnosis not present

## 2017-12-25 DIAGNOSIS — D72819 Decreased white blood cell count, unspecified: Secondary | ICD-10-CM

## 2017-12-25 DIAGNOSIS — D61818 Other pancytopenia: Secondary | ICD-10-CM

## 2017-12-25 DIAGNOSIS — R6881 Early satiety: Secondary | ICD-10-CM

## 2017-12-25 DIAGNOSIS — R635 Abnormal weight gain: Secondary | ICD-10-CM

## 2017-12-25 DIAGNOSIS — R162 Hepatomegaly with splenomegaly, not elsewhere classified: Secondary | ICD-10-CM | POA: Diagnosis not present

## 2017-12-25 DIAGNOSIS — R432 Parageusia: Secondary | ICD-10-CM

## 2017-12-25 DIAGNOSIS — R5383 Other fatigue: Secondary | ICD-10-CM

## 2017-12-25 DIAGNOSIS — Z9049 Acquired absence of other specified parts of digestive tract: Secondary | ICD-10-CM | POA: Diagnosis not present

## 2017-12-25 DIAGNOSIS — M2550 Pain in unspecified joint: Secondary | ICD-10-CM

## 2017-12-25 DIAGNOSIS — R630 Anorexia: Secondary | ICD-10-CM

## 2017-12-25 DIAGNOSIS — R0602 Shortness of breath: Secondary | ICD-10-CM | POA: Diagnosis not present

## 2017-12-25 DIAGNOSIS — D509 Iron deficiency anemia, unspecified: Secondary | ICD-10-CM

## 2017-12-25 DIAGNOSIS — B1920 Unspecified viral hepatitis C without hepatic coma: Secondary | ICD-10-CM | POA: Diagnosis not present

## 2017-12-25 DIAGNOSIS — R05 Cough: Secondary | ICD-10-CM | POA: Diagnosis not present

## 2017-12-25 DIAGNOSIS — Z79899 Other long term (current) drug therapy: Secondary | ICD-10-CM | POA: Diagnosis not present

## 2017-12-25 DIAGNOSIS — I10 Essential (primary) hypertension: Secondary | ICD-10-CM | POA: Diagnosis not present

## 2017-12-25 DIAGNOSIS — F17210 Nicotine dependence, cigarettes, uncomplicated: Secondary | ICD-10-CM

## 2017-12-25 LAB — CBC WITH DIFFERENTIAL/PLATELET
BASOS ABS: 0.1 10*3/uL (ref 0–0.1)
BASOS PCT: 4 %
EOS PCT: 3 %
Eosinophils Absolute: 0.1 10*3/uL (ref 0–0.7)
HCT: 26.3 % — ABNORMAL LOW (ref 35.0–47.0)
Hemoglobin: 7.9 g/dL — ABNORMAL LOW (ref 12.0–16.0)
Lymphocytes Relative: 22 %
Lymphs Abs: 0.5 10*3/uL — ABNORMAL LOW (ref 1.0–3.6)
MCH: 23.7 pg — ABNORMAL LOW (ref 26.0–34.0)
MCHC: 30.1 g/dL — ABNORMAL LOW (ref 32.0–36.0)
MCV: 78.6 fL — ABNORMAL LOW (ref 80.0–100.0)
MONO ABS: 0.3 10*3/uL (ref 0.2–0.9)
Monocytes Relative: 11 %
Neutro Abs: 1.5 10*3/uL (ref 1.4–6.5)
Neutrophils Relative %: 60 %
PLATELETS: 114 10*3/uL — AB (ref 150–440)
RBC: 3.34 MIL/uL — ABNORMAL LOW (ref 3.80–5.20)
RDW: 21.3 % — AB (ref 11.5–14.5)
WBC: 2.5 10*3/uL — ABNORMAL LOW (ref 3.6–11.0)

## 2017-12-25 LAB — FERRITIN: FERRITIN: 5 ng/mL — AB (ref 11–307)

## 2017-12-25 NOTE — Progress Notes (Signed)
Patient here today as new evaluation regarding pancytopenia.  Referred by Dr Harmon DunMichelle Johnson.

## 2017-12-25 NOTE — Progress Notes (Signed)
[facility] day:  12/25/2017  Chief Complaint: Kylie Monroe is a 62 y.o. female with pancytopenia who is referred by Tammi Klippel, PA in consultation for assessment and management.  HPI:   The patient is unclear when she was diagnosed with hepatitis C.  She has HCV genotype:3.  Initial HCV RNA was2,060,000 IU/ml (6.314 log 10 IU/ml) on 04/08/2017.  Fibroscore on 04/08/2017 was 0.56/F2-bridging.  She started treatment for hepatitis C in mid 11/2017.  She was seen for evaluation on 12/23/2017 in the GI clinic.  She was week 4 of 12 on Epclusa for GT 2b chronic hepatitis C.  Hepatitis C quantitative was < 15 on 12/23/2017.  She has progressive anemia and chronic thrombocytopenia.  She has mild leukopenia.  She denies any new medications or herbal products.  Diet is poor.  She denies any bleeding.  Review of labs: 11/20/1995:  Hematocrit 44.0, hemoglobin 15.4, MCV 96, platelets 258,000, WBC 12,700 with an ANC of 9900. 07/16/2010:  Hematocrit 42.4, hemoglobin 14.6, MCV 95.7, platelets 150,000, WBC 5100 with an ANC of 3400.  ALC 1400. 11/06/2015:  Hematocrit 33.3, hemoglobin 10.8, MCV 96.1, platelets 159,000, WBC 3400 with an ANC of 2400.  ALC 700. 12/06/2015:  Hematocrit 33.9, hemoglobin 11.2, MCV 94.7, platelets 91,000, WBC 3400. 04/08/2017:  Hematocrit 32.4, hemoglobin 10.1, MCV 83, platelets 104,000, WBC 2300 with ANC of 1600.  ALC 500. 12/09/2017:  Hematocrit 25.8, hemoglobin 7.4, MCV 84, platelets 84,000, WBC 2100 with an ANC of 1280.  ALC 500. 12/23/2017:  Hematocrit 25.6, hemoglobin 7.6, MCV 82.3, platelets 90,000, WBC 2200 with an ANC of 1240.  ALC 620.  04/08/2017:  Haptoglobin 45, PT 11.8 (INR1.1), HIV negative, hepatitis B surface antigen negative, hepatitis A IgM negative, hepatitis B core antibody IgM negative, B12 732, folate 13. 12/09/2017:  Creatinine 0.6, calcium 8.2, protein 6.7, albumen 3.0, and normal LFTs. 12/23/2017:   Ferritin 4, iron saturation 6%, TIBC 569 (high), folate 9.4, B12 449.  Retic 1.65%.  Abdominal ultrasound on 05/26/2017 revealed cirrhotic liver morphology with hepatosplenomegaly.  Spleen was 14.7 cm.  Last colonoscopy was 4-5 years ago at [facility].  Patient is scheduled for EGD and colonoscopy on 12/30/2017 with Dr. Vira Agar.  Symptomatically, she describes being "just here".  She notes fatigue.  She denies bleeding; no hematochezia, melena, or vaginal bleeding. LMP was "over 15 years ago". Patient has a poor appetite. She states, "someday I eat and then some I don't eat at all".  She mainly eats "chips, cakes, pies, cereal, and sandwiches".  She notes that she does not eat much meat or green leafy vegetables.  She  states, "I don't taste thing anymore. Things don't taste right".  Dysgeusia has been progressive over the last 6-8 months.   She notes a "mass on my tongue base". Patient is being seen in consult at [facility]. She denies weight loss. Patient has gained a significant amount of weight over the last 6 months. Patient notes early satiety.  She complains of generalized pain rated 5/10 today in clinic.   She has a chronic COPD related cough. She has chronic shortness of breath. Patient has polyarthralgia. She states, "my hands, hips, knees, back, and feet all hurt. They are doing imaging of my LEFT shoulder because they think it is about to come out of socket".   She denies family history of malignancy or blood disorders.  She has a routine mammogram scheduled.    Past Medical History:  Diagnosis Date  .  COPD (chronic obstructive pulmonary disease) ([place])   . Hepatitis C   . Hypertension     Past Surgical History:  Procedure Laterality Date  . CHOLECYSTECTOMY    . prolapsed rectum      Family History  Problem Relation Age of Onset  . Cancer Mother     Social History:  reports that she has been smoking cigarettes.  She has been smoking about 0.50 packs per day. she has never used smokeless  tobacco. She reports that she uses drugs. Drug: Cocaine. She reports that she does not drink alcohol.  She has smoked 1/2 ppd x 40 years.  She previously smoked 2 ppd.  She denies known exposures to radiation on toxins.  She lives in [place]. She is a retired Architect. Patient has 2 children.  Her daughter is deaf.  She lives with her x-fiance.  The patient is alone today.  Allergies: No Known Allergies  Current Medications: Current Outpatient Medications  Medication Sig Dispense Refill  . albuterol (PROAIR HFA) 108 (90 Base) MCG/ACT inhaler Inhale 90 mcg into the lungs as needed.    Marland Kitchen aspirin 81 MG chewable tablet Chew 81 mg by mouth daily.    . bisacodyl (DULCOLAX) 5 MG EC tablet Take 5 mg by mouth daily.    . Buprenorphine HCl-Naloxone HCl (SUBOXONE) 8-2 MG FILM Take 8.5 Film by mouth 3 (three) times daily.    Marland Kitchen gabapentin (NEURONTIN) 300 MG capsule Take 300 mg by mouth daily.    . Sofosbuvir-Velpatasvir (EPCLUSA) 400-100 MG TABS Take 400 mg by mouth daily.    Marland Kitchen FLOVENT HFA 220 MCG/ACT inhaler Inhale 220 mcg into the lungs 2 (two) times daily.  5   No current facility-administered medications for this visit.     Review of Systems:  GENERAL:  Feels good.  Active.  No fevers or sweats.  Weight gain of 35-40 pounds in past 6-8 months. PERFORMANCE STATUS (ECOG):  1-2. HEENT:  Base of tongue mass.  Polyp on vocal cord.  No visual changes, runny nose, sore throat, mouth sores or tenderness. Lungs:  COPD.  No shortness of breath or cough.  No hemoptysis.  Nebulized treatments QOD. Cardiac:  No chest pain, palpitations, orthopnea, or PND. GI:  Early satiety.  No nausea, vomiting, diarrhea, constipation, melena or hematochezia. GU:  No urgency, frequency, dysuria, or hematuria. Musculoskeletal:  Back pain.  h/o fractured hip.  Arthritis.  No muscle tenderness. Extremities:  No pain or swelling. Skin:  No rashes or skin changes. Neuro:  No headache, numbness or  weakness, balance or coordination issues. Endocrine:  No diabetes, thyroid issues, hot flashes or night sweats. Psych:  No mood changes, depression or anxiety. Pain:  5/10 - generalized (hip, back, hands and feet). Review of systems:  All other systems reviewed and found to be negative.  Physical Exam: Blood pressure 95/62, pulse 88, temperature 98 F (36.7 C), temperature source Tympanic, weight 152 lb 7 oz (69.1 kg), SpO2 94 %. GENERAL:  Well developed, well nourished, woman sitting comfortably in a wheelchair in the exam room in no acute distress. MENTAL STATUS:  Alert and oriented to person, place and time. HEAD:  Long brown hair.  Sunglasses propped on head.  Normocephalic, atraumatic, face symmetric, no Cushingoid features. EYES:  Blue eyes.  Pupils equal round and reactive to light and accomodation.  No conjunctivitis or scleral icterus. ENT:  Oropharynx clear without lesion.  Tongue normal.  No teeth.  Mucous membranes moist.  RESPIRATORY:  Clear to auscultation without rales, wheezes or rhonchi. CARDIOVASCULAR:  Systolic flow murmur. Regular rate and rhythm without rub or gallop. ABDOMEN:  Diffuse mild abdominal tenderness. Palpable hepatosplenomegaly. Soft with active bowel sounds,  No masses. SKIN:  Pale.  Suprapubic tattoo.  No rashes, ulcers or lesions. EXTREMITIES: No edema, no skin discoloration or tenderness.  No palpable cords. LYMPH NODES: No palpable cervical, supraclavicular, axillary or inguinal adenopathy  NEUROLOGICAL: Unremarkable. PSYCH:  Appropriate.   No visits with results within 3 Day(s) from this visit.  Latest known visit with results is:  Admission on 12/06/2015, Discharged on 12/06/2015  Component Date Value Ref Range Status  . WBC 12/06/2015 3.4* 3.6 - 11.0 K/uL Final  . RBC 12/06/2015 3.58* 3.80 - 5.20 MIL/uL Final  . Hemoglobin 12/06/2015 11.2* 12.0 - 16.0 g/dL Final  . HCT 12/06/2015 33.9* 35.0 - 47.0 % Final  . MCV 12/06/2015 94.7  80.0 - 100.0 fL  Final  . MCH 12/06/2015 31.4  26.0 - 34.0 pg Final  . MCHC 12/06/2015 33.1  32.0 - 36.0 g/dL Final  . RDW 12/06/2015 15.7* 11.5 - 14.5 % Final  . Platelets 12/06/2015 91* 150 - 440 K/uL Final  . Sodium 12/06/2015 133* 135 - 145 mmol/L Final  . Potassium 12/06/2015 3.8  3.5 - 5.1 mmol/L Final  . Chloride 12/06/2015 102  101 - 111 mmol/L Final  . CO2 12/06/2015 27  22 - 32 mmol/L Final  . Glucose, Bld 12/06/2015 104* 65 - 99 mg/dL Final  . BUN 12/06/2015 14  6 - 20 mg/dL Final  . Creatinine, Ser 12/06/2015 0.78  0.44 - 1.00 mg/dL Final  . Calcium 12/06/2015 8.5* 8.9 - 10.3 mg/dL Final  . Total Protein 12/06/2015 8.0  6.5 - 8.1 g/dL Final  . Albumin 12/06/2015 3.1* 3.5 - 5.0 g/dL Final  . AST 12/06/2015 128* 15 - 41 U/L Final  . ALT 12/06/2015 57* 14 - 54 U/L Final  . Alkaline Phosphatase 12/06/2015 122  38 - 126 U/L Final  . Total Bilirubin 12/06/2015 0.6  0.3 - 1.2 mg/dL Final  . GFR calc non Af Amer 12/06/2015 >60  >60 mL/min Final  . GFR calc Af Amer 12/06/2015 >60  >60 mL/min Final   Comment: (NOTE) The eGFR has been calculated using the CKD EPI equation. This calculation has not been validated in all clinical situations. eGFR's persistently <60 mL/min signify possible Chronic Kidney Disease.   . Anion gap 12/06/2015 4* 5 - 15 Final  . Troponin I 12/06/2015 <0.03  <0.031 ng/mL Final   Comment:        NO INDICATION OF MYOCARDIAL INJURY.     Assessment:  Kylie Monroe is a 62 y.o. female with progressive anemia over the past 8 months.  Hemoglobin has decreased from 10.1 to 7.6. She has chronic thrombocytopenia.  Platelet count has been < 100,000 since 11/2015. She has mild leukopenia.  WBC has been 2100 -2300 in the past 8 months.  She denies any new medications or herbal products.  Diet is poor.  She denies any bleeding.  Last colonoscopy was 4-5 years ago at [facility].    She has hepatitis C.  She has HCV genotype:3.  Initial HCV RNA was2,060,000 IU/ml (6.314 log 10 IU/ml) on  04/08/2017.  Fibroscore on 04/08/2017 was 0.56/F2-bridging.  She is week 4 of 12 on Epclusa for GT 2b chronic hepatitis C.  Hepatitis C quantitative was < 15 on 12/23/2017.  Outside work-up revealed the following normal  studies:  haptoglobin, HIV negative, hepatitis B surface antigen, hepatitis A IgM, hepatitis B core antibody IgM, B12, folate, and creatine.  Labs on 12/23/2017 confirmed severe iron deficiency anemia.  Ferritin was 4, iron saturation 6%, TIBC 569 (high).  Retic was 1.65% (inappropriately low).  Abdominal ultrasound on 05/26/2017 revealed cirrhotic liver morphology with hepatosplenomegaly.  Spleen was 14.7 cm.  She notes a "mass on my tongue base". Patient is being seen in consult at [facility].  She has COPD.  Symptomatically, she is fatigued.  She notes dysgeusia.  She has gained 35-40 pounds in the past 6-8 months.  She has diffuse aches.  Plan: 1.  Discuss pancytopenia secondary to underlying liver disease and splenomegaly.  Discuss iron deficiency anemia. 2.  Labs today:  CBC with diff, ANA with reflex, hepatitis B core antibody total, copper level, ferritin. 3.  Peripheral smear for path review. 4.  Contact Tammi Klippel, [place] re: additional labs [phone]) - done 5.  Discuss iron deficiency and iron supplementation. Ferritin was 4.  Will proceed with IV replacement.  Patient in agreement.  Schedule weekly Venofer 200 mg x 4. Start 12/28/2017. 6.  Preauth Venofer - done.   7.  Refer to low dose chest CT cancer screening program.  Note sent to Burgess Estelle, RN.  8.  Follow-up EGD and colonoscopy on 12/30/2017 with Dr. Vira Agar. 9.  Follow-up ENT work-up re: base of tongue mass. 10.  RTC in 1 month for MD assessment and labs (CBC with diff, ferritin, TSH, RF).    Honor Loh, NP  12/25/2017, 3:48 PM   I saw and evaluated the patient, participating in the key portions of the service and reviewing pertinent diagnostic studies and records.  I reviewed the nurse practitioner's note  and agree with the findings and the plan.  The assessment and plan were discussed with the patient.  Multiple questions were asked by the patient and answered.   Nolon Stalls, MD 12/25/2017, 3:48 PM

## 2017-12-26 ENCOUNTER — Encounter: Payer: Self-pay | Admitting: Hematology and Oncology

## 2017-12-26 LAB — ANA W/REFLEX: Anti Nuclear Antibody(ANA): NEGATIVE

## 2017-12-26 LAB — COPPER, SERUM: Copper: 89 ug/dL (ref 72–166)

## 2017-12-26 LAB — HEPATITIS B CORE ANTIBODY, TOTAL: Hep B Core Total Ab: POSITIVE — AB

## 2017-12-28 ENCOUNTER — Encounter: Payer: Self-pay | Admitting: Oncology

## 2017-12-28 ENCOUNTER — Inpatient Hospital Stay: Payer: Medicaid Other

## 2017-12-28 VITALS — BP 101/69 | HR 88 | Temp 98.0°F

## 2017-12-28 DIAGNOSIS — D61818 Other pancytopenia: Secondary | ICD-10-CM | POA: Diagnosis not present

## 2017-12-28 DIAGNOSIS — D509 Iron deficiency anemia, unspecified: Secondary | ICD-10-CM

## 2017-12-28 LAB — PATHOLOGIST SMEAR REVIEW

## 2017-12-28 MED ORDER — IRON SUCROSE 20 MG/ML IV SOLN
200.0000 mg | Freq: Once | INTRAVENOUS | Status: AC
  Administered 2017-12-28: 200 mg via INTRAVENOUS
  Filled 2017-12-28: qty 10

## 2017-12-28 MED ORDER — SODIUM CHLORIDE 0.9 % IV SOLN
Freq: Once | INTRAVENOUS | Status: AC
  Administered 2017-12-28: 15:00:00 via INTRAVENOUS
  Filled 2017-12-28: qty 1000

## 2017-12-29 ENCOUNTER — Telehealth: Payer: Self-pay | Admitting: *Deleted

## 2017-12-29 DIAGNOSIS — Z87891 Personal history of nicotine dependence: Secondary | ICD-10-CM

## 2017-12-29 DIAGNOSIS — Z122 Encounter for screening for malignant neoplasm of respiratory organs: Secondary | ICD-10-CM

## 2017-12-29 NOTE — Telephone Encounter (Signed)
Received referral for initial lung cancer screening scan. Contacted patient and obtained smoking history,(current, 44 pack year) as well as answering questions related to screening process. Patient denies signs of lung cancer such as weight loss or hemoptysis. Patient denies comorbidity that would prevent curative treatment if lung cancer were found. Patient is scheduled for shared decision making visit and CT scan on 01/14/18.

## 2017-12-30 ENCOUNTER — Ambulatory Visit: Admit: 2017-12-30 | Payer: Medicaid Other | Admitting: Unknown Physician Specialty

## 2017-12-30 SURGERY — COLONOSCOPY WITH PROPOFOL
Anesthesia: General

## 2018-01-04 ENCOUNTER — Inpatient Hospital Stay: Payer: Medicaid Other

## 2018-01-11 ENCOUNTER — Inpatient Hospital Stay: Payer: Medicaid Other | Attending: Nurse Practitioner

## 2018-01-14 ENCOUNTER — Inpatient Hospital Stay: Payer: Medicaid Other | Admitting: Nurse Practitioner

## 2018-01-14 ENCOUNTER — Ambulatory Visit: Admission: RE | Admit: 2018-01-14 | Payer: Medicaid Other | Source: Ambulatory Visit

## 2018-01-14 ENCOUNTER — Encounter: Payer: Self-pay | Admitting: Nurse Practitioner

## 2018-01-18 ENCOUNTER — Inpatient Hospital Stay: Payer: Medicaid Other

## 2018-01-24 DIAGNOSIS — D72819 Decreased white blood cell count, unspecified: Secondary | ICD-10-CM | POA: Insufficient documentation

## 2018-01-24 DIAGNOSIS — D696 Thrombocytopenia, unspecified: Secondary | ICD-10-CM | POA: Insufficient documentation

## 2018-01-24 NOTE — Progress Notes (Deleted)
[facility]  Clinic day:  01/24/2018  Chief Complaint: Kylie Monroe is a 62 y.o. female with hepatitis C and pancytopenia who is seen for review of work-up and discussion regarding direction of therapy.  HPI:   The patient was last seen in the hematology clinic on 12/25/2017 for initial consultation.  She was noted to have progressive anemia over the past 8 months.  Hemoglobin had decreased from 10.1 to 7.6. She had chronic thrombocytopenia.  Platelet count had been < 100,000 since 11/2015. She had mild leukopenia.  WBC had been 2100 -2300 in the past 8 months.  She denied any new medications or herbal products.  Diet was poor.  She denied any bleeding.   Outside labs revealed severe iron deficiency anemia.  Ferritin was 4.  She was scheduled for weekly Venofer x 4.  She received Venofer on 12/28/2017.  She underwent a work-up.  CBC revealed a hematocrit of 26.6, hemoglobin 7.9, MCV 78.6, platelets 114,000, WBC 2500 with an ANC of 1500.  Differential included 60% segs, 22% lymphs, 11% monocytes, 3% eosinophils, and 4% basophils.  Peripheral smear revealed pancytopenia.  Morphology was normal.  Copper was 89.  ANA was negative.  Hepatitis B core antibody was positive (GI notified).  Ferritin was 5.    She was scheduled for EGD and colonoscopy on 12/30/2017- cancelled.  She was scheduled for ENT evaluation for a base of tongue mass.  During the interim,    Past Medical History:  Diagnosis Date  . COPD (chronic obstructive pulmonary disease) (HCC)   . Hepatitis C   . Hypertension     Past Surgical History:  Procedure Laterality Date  . CHOLECYSTECTOMY    . prolapsed rectum      Family History  Problem Relation Age of Onset  . Cancer Mother     Social History:  reports that she has been smoking cigarettes.  She has a 44.00 pack-year smoking history. she has never used smokeless tobacco. She reports that she uses drugs. Drug: Cocaine. She reports  that she does not drink alcohol.  She has smoked 1/2 ppd x 40 years.  She previously smoked 2 ppd.  She denies known exposures to radiation on toxins.  She lives in [place]. She is a retired Arboriculturist. Patient has 2 children.  Her daughter is deaf.  She lives with her x-fiance.  The patient is alone today.  Allergies: No Known Allergies  Current Medications: Current Outpatient Medications  Medication Sig Dispense Refill  . albuterol (PROAIR HFA) 108 (90 Base) MCG/ACT inhaler Inhale 90 mcg into the lungs as needed.    Marland Kitchen aspirin 81 MG chewable tablet Chew 81 mg by mouth daily.    . bisacodyl (DULCOLAX) 5 MG EC tablet Take 5 mg by mouth daily.    . Buprenorphine HCl-Naloxone HCl (SUBOXONE) 8-2 MG FILM Take 8.5 Film by mouth 3 (three) times daily.    Marland Kitchen FLOVENT HFA 220 MCG/ACT inhaler Inhale 220 mcg into the lungs 2 (two) times daily.  5  . gabapentin (NEURONTIN) 300 MG capsule Take 300 mg by mouth daily.     No current facility-administered medications for this visit.     Review of Systems:  GENERAL:  Feels good.  Active.  No fevers or sweats.  Weight gain of 35-40 pounds in past 6-8 months. PERFORMANCE STATUS (ECOG):  1-2. HEENT:  Base of tongue mass.  Polyp on vocal cord.  No visual changes, runny  nose, sore throat, mouth sores or tenderness. Lungs:  COPD.  No shortness of breath or cough.  No hemoptysis.  Nebulized treatments QOD. Cardiac:  No chest pain, palpitations, orthopnea, or PND. GI:  Early satiety.  No nausea, vomiting, diarrhea, constipation, melena or hematochezia. GU:  No urgency, frequency, dysuria, or hematuria. Musculoskeletal:  Back pain.  h/o fractured hip.  Arthritis.  No muscle tenderness. Extremities:  No pain or swelling. Skin:  No rashes or skin changes. Neuro:  No headache, numbness or weakness, balance or coordination issues. Endocrine:  No diabetes, thyroid issues, hot flashes or night sweats. Psych:  No mood changes, depression or  anxiety. Pain:  5/10 - generalized (hip, back, hands and feet). Review of systems:  All other systems reviewed and found to be negative.  Physical Exam: There were no vitals taken for this visit. GENERAL:  Well developed, well nourished, woman sitting comfortably in a wheelchair in the exam room in no acute distress. MENTAL STATUS:  Alert and oriented to person, place and time. HEAD:  Long brown hair.  Sunglasses propped on head.  Normocephalic, atraumatic, face symmetric, no Cushingoid features. EYES:  Blue eyes.  Pupils equal round and reactive to light and accomodation.  No conjunctivitis or scleral icterus. ENT:  Oropharynx clear without lesion.  Tongue normal.  No teeth.  Mucous membranes moist.  RESPIRATORY:  Clear to auscultation without rales, wheezes or rhonchi. CARDIOVASCULAR:  Systolic flow murmur. Regular rate and rhythm without rub or gallop. ABDOMEN:  Diffuse mild abdominal tenderness. Palpable hepatosplenomegaly. Soft with active bowel sounds,  No masses. SKIN:  Pale.  Suprapubic tattoo.  No rashes, ulcers or lesions. EXTREMITIES: No edema, no skin discoloration or tenderness.  No palpable cords. LYMPH NODES: No palpable cervical, supraclavicular, axillary or inguinal adenopathy  NEUROLOGICAL: Unremarkable. PSYCH:  Appropriate.   No visits with results within 3 Day(s) from this visit.  Latest known visit with results is:  Appointment on 12/25/2017  Component Date Value Ref Range Status  . Copper 12/25/2017 89  72 - 166 ug/dL Final   Comment: (NOTE)                                Detection Limit = 5 Performed At: [facility] [address] [place], [place] [id number] Jolene Schimke MD NW:[id number] Performed at [facility], [address], [place], [place] [address]   . Anit Nuclear Antibody(ANA) 12/25/2017 Negative  Negative Final   Comment: (NOTE) Performed At: [facility] [address] [place], [place] [id number] Jolene Schimke MD  LK:[id number] Performed at [facility], [address], [place], [place] [address]   . Hep B Core Total Ab 12/25/2017 Positive* Negative Final   Comment: (NOTE) Performed At: [facility] [address] [place], [place] [id number] Jolene Schimke MD [id number] Performed at [facility], [address], [place], [place] [address]   . Ferritin 12/25/2017 5* 11 - 307 ng/mL Final   Performed at [facility], [address] [place], [place] [address]  . Path Review 12/25/2017 Smear review shows pancytopenia. Morphology is normal.  Dr. Excell Seltzer.   Final   Performed at [facility], [address], [place], [place] [address]  . WBC 12/25/2017 2.5* 3.6 - 11.0 K/uL Final  . RBC 12/25/2017 3.34* 3.80 - 5.20 MIL/uL Final  . Hemoglobin 12/25/2017 7.9* 12.0 - 16.0 g/dL Final  . HCT 93/23/5573 26.3* 35.0 - 47.0 %  Final  . MCV 12/25/2017 78.6* 80.0 - 100.0 fL Final  . MCH 12/25/2017 23.7* 26.0 - 34.0 pg Final  . MCHC 12/25/2017 30.1* 32.0 - 36.0 g/dL Final  . RDW 16/09/9603 21.3* 11.5 - 14.5 % Final  . Platelets 12/25/2017 114* 150 - 440 K/uL Final  . Neutrophils Relative % 12/25/2017 60  % Final  . Neutro Abs 12/25/2017 1.5  1.4 - 6.5 K/uL Final  . Lymphocytes Relative 12/25/2017 22  % Final  . Lymphs Abs 12/25/2017 0.5* 1.0 - 3.6 K/uL Final  . Monocytes Relative 12/25/2017 11  % Final  . Monocytes Absolute 12/25/2017 0.3  0.2 - 0.9 K/uL Final  . Eosinophils Relative 12/25/2017 3  % Final  . Eosinophils Absolute 12/25/2017 0.1  0 - 0.7 K/uL Final  . Basophils Relative 12/25/2017 4  % Final  . Basophils Absolute 12/25/2017 0.1  0 - 0.1 K/uL Final   Performed at [facility], [address] [place], [place] [address]    Assessment:  CHERISA BRUCKER is a 62 y.o. female with progressive anemia over the past 8 months.  Hemoglobin has decreased from 10.1 to 7.6. She has chronic thrombocytopenia.  Platelet count has been < 100,000 since 11/2015.  She has mild leukopenia.  WBC has been 2100 -2300 in the past 8 months.  She denies any new medications or herbal products.  Diet is poor.  She denies any bleeding.  Last colonoscopy was 4-5 years ago at [facility].    She has hepatitis C.  She has HCV genotype:3.  Initial HCV RNA was2,060,000 IU/ml (6.314 log 10 IU/ml) on 04/08/2017.  Fibroscore on 04/08/2017 was 0.56/F2-bridging.  She is week 4 of 12 on Epclusa for GT 2b chronic hepatitis C.  Hepatitis C quantitative was < 15 on 12/23/2017.  Outside work-up revealed the following normal studies:  haptoglobin, HIV negative, hepatitis B surface antigen, hepatitis A IgM, hepatitis B core antibody IgM, B12, folate, and creatine.  Labs on 12/23/2017 confirmed severe iron deficiency anemia.  Ferritin was 4, iron saturation 6%, TIBC 569 (high).  Retic was 1.65% (inappropriately low).  Work-up on 12/25/2017 revealed a hematocrit of 26.6, hemoglobin 7.9, MCV 78.6, platelets 114,000, WBC 2500 with an ANC of 1500.  Differential included 60% segs, 22% lymphs, 11% monocytes, 3% eosinophils, and 4% basophils.  Peripheral smear revealed pancytopenia.  Morphology was normal.  Copper was 89.  ANA was negative.  Hepatitis B core antibody was positive (GI notified).  Ferritin was 5.    She received Venofer on 12/28/2017.  Ferritin has been followed: 5 on 12/25/2017.  Abdominal ultrasound on 05/26/2017 revealed cirrhotic liver morphology with hepatosplenomegaly.  Spleen was 14.7 cm.  She notes a "mass on my tongue base". Patient is being seen in consult at [facility].  She has COPD.  Symptomatically, she is fatigued.  She notes dysgeusia.  She has gained 35-40 pounds in the past 6-8 months.  She has diffuse aches.  Plan: 1.  Labs today:  CBC with diff, ferritin, TSH, RF. 2.  Review initial work-up.  Patient has severe iron deficiency.  Patient has a past history of hepatitis B.  Suspect mild leukopenia is secondary to underlying liver disease and splenomegaly.   3.  Venofer  today.   5.  Discuss iron deficiency and iron supplementation. Ferritin was 4.  Will proceed with IV replacement.  Patient in agreement.  Schedule weekly Venofer 200 mg x 4. Start 12/28/2017. 6.  Preauth Venofer - done.   7.  Refer to low dose chest CT cancer screening program.  Note sent to Glenna FellowsShawn Perkins, RN.  8.  Follow-up EGD and colonoscopy on 12/30/2017 with Dr. Mechele CollinElliott. 9.  Follow-up ENT work-up re: base of tongue mass. 10.  RTC in 1 month for MD assessment and labs (CBC with diff, ferritin, TSH, RF).    Rosey BathMelissa C Corcoran, MD  01/24/2018, 9:08 PM   I saw and evaluated the patient, participating in the key portions of the service and reviewing pertinent diagnostic studies and records.  I reviewed the nurse practitioner's note and agree with the findings and the plan.  The assessment and plan were discussed with the patient.  Multiple questions were asked by the patient and answered.   Nelva NayMelissa Corcoran, MD 01/24/2018, 9:08 PM

## 2018-01-25 ENCOUNTER — Inpatient Hospital Stay: Payer: Medicaid Other

## 2018-01-25 ENCOUNTER — Inpatient Hospital Stay: Payer: Medicaid Other | Admitting: Hematology and Oncology

## 2018-02-04 ENCOUNTER — Encounter: Payer: Self-pay | Admitting: *Deleted

## 2018-09-13 ENCOUNTER — Other Ambulatory Visit: Payer: Self-pay | Admitting: Registered Nurse

## 2018-09-13 DIAGNOSIS — B1920 Unspecified viral hepatitis C without hepatic coma: Secondary | ICD-10-CM

## 2018-09-13 DIAGNOSIS — K7460 Unspecified cirrhosis of liver: Secondary | ICD-10-CM

## 2018-09-17 ENCOUNTER — Ambulatory Visit: Payer: Medicaid Other

## 2018-10-24 ENCOUNTER — Emergency Department: Payer: Medicaid Other

## 2018-10-24 ENCOUNTER — Other Ambulatory Visit: Payer: Self-pay

## 2018-10-24 ENCOUNTER — Inpatient Hospital Stay
Admission: EM | Admit: 2018-10-24 | Discharge: 2018-11-07 | DRG: 870 | Disposition: A | Discharge status: Home Health | Payer: Medicaid Other | Attending: Internal Medicine | Admitting: Internal Medicine

## 2018-10-24 DIAGNOSIS — J440 Chronic obstructive pulmonary disease with acute lower respiratory infection: Secondary | ICD-10-CM | POA: Diagnosis present

## 2018-10-24 DIAGNOSIS — Z978 Presence of other specified devices: Secondary | ICD-10-CM | POA: Diagnosis not present

## 2018-10-24 DIAGNOSIS — B9620 Unspecified Escherichia coli [E. coli] as the cause of diseases classified elsewhere: Secondary | ICD-10-CM | POA: Diagnosis present

## 2018-10-24 DIAGNOSIS — J13 Pneumonia due to Streptococcus pneumoniae: Secondary | ICD-10-CM | POA: Diagnosis present

## 2018-10-24 DIAGNOSIS — D638 Anemia in other chronic diseases classified elsewhere: Secondary | ICD-10-CM | POA: Diagnosis present

## 2018-10-24 DIAGNOSIS — J449 Chronic obstructive pulmonary disease, unspecified: Secondary | ICD-10-CM | POA: Diagnosis not present

## 2018-10-24 DIAGNOSIS — N390 Urinary tract infection, site not specified: Secondary | ICD-10-CM | POA: Diagnosis present

## 2018-10-24 DIAGNOSIS — L8990 Pressure ulcer of unspecified site, unspecified stage: Secondary | ICD-10-CM

## 2018-10-24 DIAGNOSIS — T405X5A Adverse effect of cocaine, initial encounter: Secondary | ICD-10-CM | POA: Diagnosis present

## 2018-10-24 DIAGNOSIS — Z7951 Long term (current) use of inhaled steroids: Secondary | ICD-10-CM

## 2018-10-24 DIAGNOSIS — D6959 Other secondary thrombocytopenia: Secondary | ICD-10-CM | POA: Diagnosis present

## 2018-10-24 DIAGNOSIS — J9602 Acute respiratory failure with hypercapnia: Secondary | ICD-10-CM | POA: Diagnosis present

## 2018-10-24 DIAGNOSIS — A4151 Sepsis due to Escherichia coli [E. coli]: Principal | ICD-10-CM | POA: Diagnosis present

## 2018-10-24 DIAGNOSIS — R4182 Altered mental status, unspecified: Secondary | ICD-10-CM | POA: Diagnosis present

## 2018-10-24 DIAGNOSIS — J9600 Acute respiratory failure, unspecified whether with hypoxia or hypercapnia: Secondary | ICD-10-CM

## 2018-10-24 DIAGNOSIS — Z9049 Acquired absence of other specified parts of digestive tract: Secondary | ICD-10-CM

## 2018-10-24 DIAGNOSIS — B009 Herpesviral infection, unspecified: Secondary | ICD-10-CM | POA: Diagnosis present

## 2018-10-24 DIAGNOSIS — J181 Lobar pneumonia, unspecified organism: Secondary | ICD-10-CM | POA: Diagnosis not present

## 2018-10-24 DIAGNOSIS — R825 Elevated urine levels of drugs, medicaments and biological substances: Secondary | ICD-10-CM | POA: Diagnosis not present

## 2018-10-24 DIAGNOSIS — J441 Chronic obstructive pulmonary disease with (acute) exacerbation: Secondary | ICD-10-CM | POA: Diagnosis present

## 2018-10-24 DIAGNOSIS — Z6823 Body mass index (BMI) 23.0-23.9, adult: Secondary | ICD-10-CM | POA: Diagnosis not present

## 2018-10-24 DIAGNOSIS — B9689 Other specified bacterial agents as the cause of diseases classified elsewhere: Secondary | ICD-10-CM | POA: Diagnosis present

## 2018-10-24 DIAGNOSIS — A419 Sepsis, unspecified organism: Secondary | ICD-10-CM | POA: Diagnosis not present

## 2018-10-24 DIAGNOSIS — F1490 Cocaine use, unspecified, uncomplicated: Secondary | ICD-10-CM | POA: Diagnosis not present

## 2018-10-24 DIAGNOSIS — Z515 Encounter for palliative care: Secondary | ICD-10-CM | POA: Diagnosis not present

## 2018-10-24 DIAGNOSIS — R7881 Bacteremia: Secondary | ICD-10-CM | POA: Diagnosis not present

## 2018-10-24 DIAGNOSIS — F1910 Other psychoactive substance abuse, uncomplicated: Secondary | ICD-10-CM | POA: Diagnosis not present

## 2018-10-24 DIAGNOSIS — J9601 Acute respiratory failure with hypoxia: Secondary | ICD-10-CM

## 2018-10-24 DIAGNOSIS — Z9889 Other specified postprocedural states: Secondary | ICD-10-CM | POA: Diagnosis not present

## 2018-10-24 DIAGNOSIS — G894 Chronic pain syndrome: Secondary | ICD-10-CM | POA: Diagnosis present

## 2018-10-24 DIAGNOSIS — E872 Acidosis: Secondary | ICD-10-CM | POA: Diagnosis present

## 2018-10-24 DIAGNOSIS — Z4659 Encounter for fitting and adjustment of other gastrointestinal appliance and device: Secondary | ICD-10-CM

## 2018-10-24 DIAGNOSIS — G92 Toxic encephalopathy: Secondary | ICD-10-CM | POA: Diagnosis present

## 2018-10-24 DIAGNOSIS — I10 Essential (primary) hypertension: Secondary | ICD-10-CM | POA: Diagnosis present

## 2018-10-24 DIAGNOSIS — B953 Streptococcus pneumoniae as the cause of diseases classified elsewhere: Secondary | ICD-10-CM | POA: Diagnosis present

## 2018-10-24 DIAGNOSIS — Z9911 Dependence on respirator [ventilator] status: Secondary | ICD-10-CM | POA: Diagnosis not present

## 2018-10-24 DIAGNOSIS — Z23 Encounter for immunization: Secondary | ICD-10-CM | POA: Diagnosis not present

## 2018-10-24 DIAGNOSIS — F14929 Cocaine use, unspecified with intoxication, unspecified: Secondary | ICD-10-CM | POA: Diagnosis present

## 2018-10-24 DIAGNOSIS — Z79899 Other long term (current) drug therapy: Secondary | ICD-10-CM

## 2018-10-24 DIAGNOSIS — E876 Hypokalemia: Secondary | ICD-10-CM | POA: Diagnosis present

## 2018-10-24 DIAGNOSIS — F1190 Opioid use, unspecified, uncomplicated: Secondary | ICD-10-CM | POA: Diagnosis not present

## 2018-10-24 DIAGNOSIS — D649 Anemia, unspecified: Secondary | ICD-10-CM | POA: Diagnosis not present

## 2018-10-24 DIAGNOSIS — Z789 Other specified health status: Secondary | ICD-10-CM

## 2018-10-24 DIAGNOSIS — Z7189 Other specified counseling: Secondary | ICD-10-CM

## 2018-10-24 DIAGNOSIS — E781 Pure hyperglyceridemia: Secondary | ICD-10-CM | POA: Diagnosis present

## 2018-10-24 DIAGNOSIS — R8271 Bacteriuria: Secondary | ICD-10-CM | POA: Diagnosis not present

## 2018-10-24 DIAGNOSIS — R6521 Severe sepsis with septic shock: Secondary | ICD-10-CM | POA: Diagnosis not present

## 2018-10-24 DIAGNOSIS — F17210 Nicotine dependence, cigarettes, uncomplicated: Secondary | ICD-10-CM | POA: Diagnosis not present

## 2018-10-24 DIAGNOSIS — R509 Fever, unspecified: Secondary | ICD-10-CM | POA: Diagnosis not present

## 2018-10-24 DIAGNOSIS — J9811 Atelectasis: Secondary | ICD-10-CM

## 2018-10-24 DIAGNOSIS — I361 Nonrheumatic tricuspid (valve) insufficiency: Secondary | ICD-10-CM | POA: Diagnosis not present

## 2018-10-24 DIAGNOSIS — Z8619 Personal history of other infectious and parasitic diseases: Secondary | ICD-10-CM | POA: Diagnosis not present

## 2018-10-24 DIAGNOSIS — R32 Unspecified urinary incontinence: Secondary | ICD-10-CM | POA: Diagnosis not present

## 2018-10-24 DIAGNOSIS — E440 Moderate protein-calorie malnutrition: Secondary | ICD-10-CM

## 2018-10-24 DIAGNOSIS — J189 Pneumonia, unspecified organism: Secondary | ICD-10-CM

## 2018-10-24 DIAGNOSIS — Z7982 Long term (current) use of aspirin: Secondary | ICD-10-CM

## 2018-10-24 LAB — BLOOD GAS, ARTERIAL
Acid-Base Excess: 2.1 mmol/L — ABNORMAL HIGH (ref 0.0–2.0)
Bicarbonate: 27.8 mmol/L (ref 20.0–28.0)
FIO2: 0.6
MECHVT: 450 mL
Mechanical Rate: 16
O2 Saturation: 89.6 %
Patient temperature: 37
pCO2 arterial: 47 mmHg (ref 32.0–48.0)
pH, Arterial: 7.38 (ref 7.350–7.450)
pO2, Arterial: 59 mmHg — ABNORMAL LOW (ref 83.0–108.0)

## 2018-10-24 LAB — BLOOD CULTURE ID PANEL (REFLEXED)
Acinetobacter baumannii: NOT DETECTED
CANDIDA GLABRATA: NOT DETECTED
CANDIDA KRUSEI: NOT DETECTED
CANDIDA TROPICALIS: NOT DETECTED
Candida albicans: NOT DETECTED
Candida parapsilosis: NOT DETECTED
ENTEROBACTER CLOACAE COMPLEX: NOT DETECTED
ESCHERICHIA COLI: NOT DETECTED
Enterobacteriaceae species: NOT DETECTED
Enterococcus species: NOT DETECTED
Haemophilus influenzae: NOT DETECTED
KLEBSIELLA PNEUMONIAE: NOT DETECTED
Klebsiella oxytoca: NOT DETECTED
Listeria monocytogenes: NOT DETECTED
NEISSERIA MENINGITIDIS: NOT DETECTED
PROTEUS SPECIES: NOT DETECTED
Pseudomonas aeruginosa: NOT DETECTED
SERRATIA MARCESCENS: NOT DETECTED
STAPHYLOCOCCUS SPECIES: NOT DETECTED
Staphylococcus aureus (BCID): NOT DETECTED
Streptococcus agalactiae: NOT DETECTED
Streptococcus pneumoniae: DETECTED — AB
Streptococcus pyogenes: NOT DETECTED
Streptococcus species: DETECTED — AB

## 2018-10-24 LAB — URINE DRUG SCREEN, QUALITATIVE (ARMC ONLY)
Amphetamines, Ur Screen: NOT DETECTED
BARBITURATES, UR SCREEN: NOT DETECTED
Benzodiazepine, Ur Scrn: NOT DETECTED
Cannabinoid 50 Ng, Ur ~~LOC~~: NOT DETECTED
Cocaine Metabolite,Ur ~~LOC~~: POSITIVE — AB
MDMA (Ecstasy)Ur Screen: NOT DETECTED
METHADONE SCREEN, URINE: NOT DETECTED
OPIATE, UR SCREEN: NOT DETECTED
PHENCYCLIDINE (PCP) UR S: NOT DETECTED
Tricyclic, Ur Screen: NOT DETECTED

## 2018-10-24 LAB — COMPREHENSIVE METABOLIC PANEL
ALK PHOS: 81 U/L (ref 38–126)
ALT: 21 U/L (ref 0–44)
AST: 59 U/L — AB (ref 15–41)
Albumin: 2.9 g/dL — ABNORMAL LOW (ref 3.5–5.0)
Anion gap: 10 (ref 5–15)
BILIRUBIN TOTAL: 2.4 mg/dL — AB (ref 0.3–1.2)
BUN: 30 mg/dL — AB (ref 6–20)
CALCIUM: 9 mg/dL (ref 8.9–10.3)
CO2: 31 mmol/L (ref 22–32)
Chloride: 90 mmol/L — ABNORMAL LOW (ref 98–111)
Creatinine, Ser: 0.58 mg/dL (ref 0.44–1.00)
GFR calc Af Amer: >60 mL/min (ref >60)
GLUCOSE: 90 mg/dL (ref 70–99)
POTASSIUM: 3.6 mmol/L (ref 3.5–5.1)
Sodium: 131 mmol/L — ABNORMAL LOW (ref 135–145)
TOTAL PROTEIN: 7 g/dL (ref 6.5–8.1)

## 2018-10-24 LAB — CSF CELL COUNT WITH DIFFERENTIAL
Eosinophils, CSF: 0 %
Eosinophils, CSF: 0 %
LYMPHS CSF: 40 %
Lymphs, CSF: 52 %
MONOCYTE-MACROPHAGE-SPINAL FLUID: 60 %
Monocyte-Macrophage-Spinal Fluid: 46 %
OTHER CELLS CSF: 0
OTHER CELLS CSF: 0
RBC COUNT CSF: 10 /mm3 — AB (ref 0–3)
RBC COUNT CSF: 10 /mm3 — AB (ref 0–3)
SEGMENTED NEUTROPHILS-CSF: 2 %
Segmented Neutrophils-CSF: 0 %
TUBE #: 1
Tube #: 4
WBC CSF: 2 /mm3 (ref 0–5)
WBC, CSF: 2 /mm3 (ref 0–5)

## 2018-10-24 LAB — PROTEIN AND GLUCOSE, CSF
GLUCOSE CSF: 62 mg/dL (ref 40–70)
TOTAL PROTEIN, CSF: 20 mg/dL (ref 15–45)

## 2018-10-24 LAB — URINALYSIS, COMPLETE (UACMP) WITH MICROSCOPIC
BILIRUBIN URINE: NEGATIVE
GLUCOSE, UA: NEGATIVE mg/dL
Ketones, ur: NEGATIVE mg/dL
LEUKOCYTES UA: NEGATIVE
NITRITE: POSITIVE — AB
PROTEIN: NEGATIVE mg/dL
Specific Gravity, Urine: 1.021 (ref 1.005–1.030)
pH: 6 (ref 5.0–8.0)

## 2018-10-24 LAB — CBC WITH DIFFERENTIAL/PLATELET
ABS IMMATURE GRANULOCYTES: 0.07 10*3/uL (ref 0.00–0.07)
BASOS ABS: 0 10*3/uL (ref 0.0–0.1)
Basophils Relative: 0 %
EOS ABS: 0.1 10*3/uL (ref 0.0–0.5)
Eosinophils Relative: 1 %
HEMATOCRIT: 39.3 % (ref 36.0–46.0)
HEMOGLOBIN: 12.4 g/dL (ref 12.0–15.0)
IMMATURE GRANULOCYTES: 1 %
LYMPHS ABS: 0.3 10*3/uL — AB (ref 0.7–4.0)
LYMPHS PCT: 3 %
MCH: 28.4 pg (ref 26.0–34.0)
MCHC: 31.6 g/dL (ref 30.0–36.0)
MCV: 89.9 fL (ref 80.0–100.0)
MONOS PCT: 2 %
Monocytes Absolute: 0.2 10*3/uL (ref 0.1–1.0)
NEUTROS ABS: 11.9 10*3/uL — AB (ref 1.7–7.7)
NEUTROS PCT: 93 %
NRBC: 0.3 % — AB (ref 0.0–0.2)
Platelets: 121 10*3/uL — ABNORMAL LOW (ref 150–400)
RBC: 4.37 MIL/uL (ref 3.87–5.11)
RDW: 19.4 % — ABNORMAL HIGH (ref 11.5–15.5)
WBC: 12.5 10*3/uL — ABNORMAL HIGH (ref 4.0–10.5)

## 2018-10-24 LAB — RAPID HIV SCREEN (HIV 1/2 AB+AG)
HIV 1/2 Antibodies: NONREACTIVE
HIV-1 P24 ANTIGEN - HIV24: NONREACTIVE

## 2018-10-24 LAB — INFLUENZA PANEL BY PCR (TYPE A & B)
Influenza A By PCR: NEGATIVE
Influenza B By PCR: NEGATIVE

## 2018-10-24 LAB — LACTIC ACID, PLASMA
Lactic Acid, Venous: 4.3 mmol/L (ref 0.5–1.9)
Lactic Acid, Venous: 3 mmol/L (ref 0.5–1.9)

## 2018-10-24 LAB — EXPECTORATED SPUTUM ASSESSMENT W GRAM STAIN, RFLX TO RESP C

## 2018-10-24 LAB — ACETAMINOPHEN LEVEL

## 2018-10-24 LAB — ETHANOL

## 2018-10-24 LAB — TSH: TSH: 0.525 u[IU]/mL (ref 0.350–4.500)

## 2018-10-24 LAB — TROPONIN I: Troponin I: 0.04 ng/mL (ref <0.03)

## 2018-10-24 LAB — TRIGLYCERIDES: TRIGLYCERIDES: 154 mg/dL — AB (ref <150)

## 2018-10-24 LAB — EXPECTORATED SPUTUM ASSESSMENT W REFEX TO RESP CULTURE

## 2018-10-24 LAB — MRSA PCR SCREENING: MRSA BY PCR: NEGATIVE

## 2018-10-24 LAB — SALICYLATE LEVEL: Salicylate Lvl: <7 mg/dL (ref 2.8–30.0)

## 2018-10-24 LAB — GLUCOSE, CAPILLARY: Glucose-Capillary: 98 mg/dL (ref 70–99)

## 2018-10-24 MED ORDER — SODIUM CHLORIDE 0.9 % IV BOLUS
1000.0000 mL | Freq: Once | INTRAVENOUS | Status: AC
  Administered 2018-10-24: 1000 mL via INTRAVENOUS

## 2018-10-24 MED ORDER — CHLORHEXIDINE GLUCONATE 0.12% ORAL RINSE (MEDLINE KIT)
15.0000 mL | Freq: Two times a day (BID) | OROMUCOSAL | Status: DC
  Administered 2018-10-24 – 2018-11-02 (×19): 15 mL via OROMUCOSAL

## 2018-10-24 MED ORDER — SODIUM CHLORIDE 0.9 % IV SOLN
2.0000 g | Freq: Two times a day (BID) | INTRAVENOUS | Status: DC
  Administered 2018-10-24 – 2018-10-25 (×2): 2 g via INTRAVENOUS
  Filled 2018-10-24 (×2): qty 20
  Filled 2018-10-24: qty 2

## 2018-10-24 MED ORDER — METRONIDAZOLE IN NACL 5-0.79 MG/ML-% IV SOLN
500.0000 mg | Freq: Once | INTRAVENOUS | Status: AC
  Administered 2018-10-24: 500 mg via INTRAVENOUS
  Filled 2018-10-24: qty 100

## 2018-10-24 MED ORDER — VASOPRESSIN 20 UNIT/ML IV SOLN
0.0300 [IU]/min | INTRAVENOUS | Status: DC
  Filled 2018-10-24: qty 2

## 2018-10-24 MED ORDER — HYDROCORTISONE NA SUCCINATE PF 100 MG IJ SOLR
100.0000 mg | Freq: Once | INTRAMUSCULAR | Status: AC
  Administered 2018-10-24: 100 mg via INTRAVENOUS
  Filled 2018-10-24: qty 2

## 2018-10-24 MED ORDER — PNEUMOCOCCAL VAC POLYVALENT 25 MCG/0.5ML IJ INJ
0.5000 mL | INJECTION | INTRAMUSCULAR | Status: AC
  Administered 2018-10-25: 0.5 mL via INTRAMUSCULAR
  Filled 2018-10-24: qty 0.5

## 2018-10-24 MED ORDER — ACETAMINOPHEN 10 MG/ML IV SOLN
1000.0000 mg | Freq: Once | INTRAVENOUS | Status: AC
  Administered 2018-10-24: 1000 mg via INTRAVENOUS
  Filled 2018-10-24: qty 100

## 2018-10-24 MED ORDER — SODIUM CHLORIDE 0.9 % IV SOLN
2.0000 g | Freq: Once | INTRAVENOUS | Status: AC
  Administered 2018-10-24: 2 g via INTRAVENOUS
  Filled 2018-10-24: qty 20

## 2018-10-24 MED ORDER — SODIUM CHLORIDE 0.9 % IV SOLN
INTRAVENOUS | Status: DC
  Administered 2018-10-24 – 2018-10-27 (×5): via INTRAVENOUS

## 2018-10-24 MED ORDER — SODIUM CHLORIDE 0.9 % IV SOLN
5.0000 mg/kg | Freq: Two times a day (BID) | INTRAVENOUS | Status: AC
  Administered 2018-10-24 – 2018-10-25 (×4): 280 mg via INTRAVENOUS
  Filled 2018-10-24 (×5): qty 5.6

## 2018-10-24 MED ORDER — FENTANYL 2500MCG IN NS 250ML (10MCG/ML) PREMIX INFUSION
0.0000 ug/h | INTRAVENOUS | Status: DC
  Administered 2018-10-24 (×2): 200 ug/h via INTRAVENOUS
  Administered 2018-10-25 (×2): 300 ug/h via INTRAVENOUS
  Administered 2018-10-26: 400 ug/h via INTRAVENOUS
  Administered 2018-10-26: 300 ug/h via INTRAVENOUS
  Administered 2018-10-26: 400 ug/h via INTRAVENOUS
  Administered 2018-10-26: 300 ug/h via INTRAVENOUS
  Administered 2018-10-27: 400 ug/h via INTRAVENOUS
  Administered 2018-10-27 – 2018-10-28 (×2): 200 ug/h via INTRAVENOUS
  Administered 2018-10-28: 300 ug/h via INTRAVENOUS
  Administered 2018-10-29 (×2): 200 ug/h via INTRAVENOUS
  Administered 2018-10-30: 125 ug/h via INTRAVENOUS
  Administered 2018-10-31: 100 ug/h via INTRAVENOUS
  Administered 2018-11-01 – 2018-11-02 (×2): 200 ug/h via INTRAVENOUS
  Filled 2018-10-24 (×20): qty 250

## 2018-10-24 MED ORDER — ONDANSETRON HCL 4 MG PO TABS
4.0000 mg | ORAL_TABLET | Freq: Four times a day (QID) | ORAL | Status: DC | PRN
  Administered 2018-11-07: 4 mg via ORAL
  Filled 2018-10-24: qty 1

## 2018-10-24 MED ORDER — METHYLPREDNISOLONE SODIUM SUCC 40 MG IJ SOLR
40.0000 mg | Freq: Two times a day (BID) | INTRAMUSCULAR | Status: DC
  Administered 2018-10-24 – 2018-10-27 (×7): 40 mg via INTRAVENOUS
  Filled 2018-10-24 (×7): qty 1

## 2018-10-24 MED ORDER — BUDESONIDE 0.5 MG/2ML IN SUSP
0.5000 mg | Freq: Two times a day (BID) | RESPIRATORY_TRACT | Status: DC
  Administered 2018-10-24 – 2018-11-07 (×29): 0.5 mg via RESPIRATORY_TRACT
  Filled 2018-10-24 (×29): qty 2

## 2018-10-24 MED ORDER — DIPHENHYDRAMINE HCL 50 MG/ML IJ SOLN
25.0000 mg | Freq: Once | INTRAMUSCULAR | Status: AC
  Administered 2018-10-24: 25 mg via INTRAVENOUS
  Filled 2018-10-24: qty 1

## 2018-10-24 MED ORDER — FENTANYL 2500MCG IN NS 250ML (10MCG/ML) PREMIX INFUSION
INTRAVENOUS | Status: AC
  Administered 2018-10-24: 100 ug/h
  Filled 2018-10-24: qty 250

## 2018-10-24 MED ORDER — INFLUENZA VAC SPLIT QUAD 0.5 ML IM SUSY
0.5000 mL | PREFILLED_SYRINGE | INTRAMUSCULAR | Status: AC
  Administered 2018-10-25: 0.5 mL via INTRAMUSCULAR
  Filled 2018-10-24: qty 0.5

## 2018-10-24 MED ORDER — DOCUSATE SODIUM 50 MG/5ML PO LIQD
100.0000 mg | Freq: Every day | ORAL | Status: DC
  Administered 2018-10-24 – 2018-11-07 (×13): 100 mg via ORAL
  Filled 2018-10-24 (×12): qty 10

## 2018-10-24 MED ORDER — PROPOFOL 1000 MG/100ML IV EMUL
5.0000 ug/kg/min | INTRAVENOUS | Status: DC
  Administered 2018-10-24: 50 ug/kg/min via INTRAVENOUS
  Administered 2018-10-24: 25 ug/kg/min via INTRAVENOUS
  Administered 2018-10-25: 15 ug/kg/min via INTRAVENOUS
  Administered 2018-10-25: 30 ug/kg/min via INTRAVENOUS
  Administered 2018-10-26: 20 ug/kg/min via INTRAVENOUS
  Administered 2018-10-26: 15 ug/kg/min via INTRAVENOUS
  Administered 2018-10-27: 50 ug/kg/min via INTRAVENOUS
  Administered 2018-10-28 – 2018-10-29 (×2): 10 ug/kg/min via INTRAVENOUS
  Administered 2018-10-30: 15.018 ug/kg/min via INTRAVENOUS
  Administered 2018-10-30: 10 ug/kg/min via INTRAVENOUS
  Administered 2018-11-01 – 2018-11-02 (×2): 20 ug/kg/min via INTRAVENOUS
  Filled 2018-10-24 (×16): qty 100

## 2018-10-24 MED ORDER — ONDANSETRON HCL 4 MG/2ML IJ SOLN
4.0000 mg | Freq: Four times a day (QID) | INTRAMUSCULAR | Status: DC | PRN
  Administered 2018-11-06: 4 mg via INTRAVENOUS
  Filled 2018-10-24: qty 2

## 2018-10-24 MED ORDER — SODIUM CHLORIDE 0.9 % IV SOLN
2.0000 g | Freq: Two times a day (BID) | INTRAVENOUS | Status: DC
  Administered 2018-10-24: 2 g via INTRAVENOUS
  Filled 2018-10-24 (×3): qty 2

## 2018-10-24 MED ORDER — PROCHLORPERAZINE EDISYLATE 10 MG/2ML IJ SOLN
10.0000 mg | Freq: Once | INTRAMUSCULAR | Status: AC
  Administered 2018-10-24: 10 mg via INTRAVENOUS
  Filled 2018-10-24: qty 2

## 2018-10-24 MED ORDER — NOREPINEPHRINE 4 MG/250ML-% IV SOLN
0.0000 ug/min | INTRAVENOUS | Status: DC
  Administered 2018-10-24: 10 ug/min via INTRAVENOUS
  Administered 2018-10-24: 15 ug/min via INTRAVENOUS
  Administered 2018-10-29: 2 ug/min via INTRAVENOUS
  Administered 2018-10-30: 4 ug/min via INTRAVENOUS
  Filled 2018-10-24 (×4): qty 250

## 2018-10-24 MED ORDER — ENOXAPARIN SODIUM 40 MG/0.4ML ~~LOC~~ SOLN
40.0000 mg | SUBCUTANEOUS | Status: DC
  Administered 2018-10-24 – 2018-10-25 (×2): 40 mg via SUBCUTANEOUS
  Filled 2018-10-24 (×2): qty 0.4

## 2018-10-24 MED ORDER — ACETAMINOPHEN 650 MG RE SUPP
650.0000 mg | Freq: Four times a day (QID) | RECTAL | Status: DC | PRN
  Filled 2018-10-24: qty 1

## 2018-10-24 MED ORDER — FENTANYL CITRATE (PF) 100 MCG/2ML IJ SOLN
100.0000 ug | Freq: Once | INTRAMUSCULAR | Status: AC
  Administered 2018-10-24: 100 ug via INTRAVENOUS
  Filled 2018-10-24: qty 2

## 2018-10-24 MED ORDER — DOCUSATE SODIUM 100 MG PO CAPS: 100.0000 mg | ORAL_CAPSULE | Freq: Two times a day (BID) | ORAL | Status: DC

## 2018-10-24 MED ORDER — PROPOFOL 1000 MG/100ML IV EMUL
INTRAVENOUS | Status: AC
  Administered 2018-10-24: 20 ug/kg/min
  Filled 2018-10-24: qty 100

## 2018-10-24 MED ORDER — ACETAMINOPHEN 325 MG PO TABS
650.0000 mg | ORAL_TABLET | Freq: Four times a day (QID) | ORAL | Status: DC | PRN
  Administered 2018-10-24 – 2018-11-07 (×8): 650 mg via ORAL
  Filled 2018-10-24 (×8): qty 2

## 2018-10-24 MED ORDER — IPRATROPIUM-ALBUTEROL 0.5-2.5 (3) MG/3ML IN SOLN
3.0000 mL | RESPIRATORY_TRACT | Status: DC
  Administered 2018-10-24 – 2018-11-06 (×79): 3 mL via RESPIRATORY_TRACT
  Filled 2018-10-24 (×78): qty 3

## 2018-10-24 MED ORDER — FENTANYL CITRATE (PF) 100 MCG/2ML IJ SOLN
100.0000 ug | Freq: Once | INTRAMUSCULAR | Status: AC
  Administered 2018-10-24: 100 ug via INTRAVENOUS

## 2018-10-24 MED ORDER — ORAL CARE MOUTH RINSE
15.0000 mL | OROMUCOSAL | Status: DC
  Administered 2018-10-24 – 2018-11-02 (×91): 15 mL via OROMUCOSAL

## 2018-10-24 MED ORDER — ALBUTEROL SULFATE (2.5 MG/3ML) 0.083% IN NEBU: 2.5000 mg | INHALATION_SOLUTION | RESPIRATORY_TRACT | Status: DC | PRN

## 2018-10-24 MED ORDER — FAMOTIDINE IN NACL 20-0.9 MG/50ML-% IV SOLN
20.0000 mg | INTRAVENOUS | Status: DC
  Administered 2018-10-24 – 2018-10-25 (×2): 20 mg via INTRAVENOUS
  Filled 2018-10-24 (×2): qty 50

## 2018-10-24 NOTE — Progress Notes (Signed)
CODE SEPSIS - PHARMACY COMMUNICATION  **Broad Spectrum Antibiotics should be administered within 1 hour of Sepsis diagnosis**  Time Code Sepsis Called/Page Received: @ 0454  Antibiotics Ordered: Ceftriaxone                                        ganciclovir  Time of 1st antibiotic administration: @ 0530  Additional action taken by pharmacy: N/A  If necessary, Name of Provider/Nurse Contacted: N/A  Gardner CandleSheema M Demari Gales, PharmD, BCPS Clinical Pharmacist 10/24/2018 5:50 AM

## 2018-10-24 NOTE — Consult Note (Signed)
Name: Kylie Monroe MRN: [id number] DOB: Jul 16, 1956     CONSULTATION DATE: 10/24/2018  REFERRING MD :  Marcille Blanco  CHIEF COMPLAINT:  resp failure    HISTORY OF PRESENT ILLNESS:  62 yo female admitted to ICU for acute resp failure Seen in ER for severe headaches, somnolence Intubated for resp distress protection of airway LP and CT performed acute findings  CXR show RLL lung opacity c/w pneumonia  Patient is critically ill Prognosis is guarded     Antimicrobials this admission: 11/17 Ceftriaxone >> x1 11/17 Ganciclovir>> 11/17 Cefepime>> 11/17 Flagyl>>   Microbiology results: 11/17 BCx: pending 11/17 UCx: pending  11/17 CSF Cx: pending 11/17 INLF NEG 11/17 HIV NEG     PAST MEDICAL HISTORY :   has a past medical history of COPD (chronic obstructive pulmonary disease) ([place]), Hepatitis C, and Hypertension.  has a past surgical history that includes Cholecystectomy and prolapsed rectum. Prior to Admission medications   Medication Sig Start Date End Date Taking? Authorizing Provider  albuterol (PROAIR HFA) 108 (90 Base) MCG/ACT inhaler Inhale 90 mcg into the lungs as needed. 01/25/13   [provider]  aspirin 81 MG chewable tablet Chew 81 mg by mouth daily. 09/19/16   [provider]  bisacodyl (DULCOLAX) 5 MG EC tablet Take 5 mg by mouth daily. 09/25/08   [provider]  Buprenorphine HCl-Naloxone HCl (SUBOXONE) 8-2 MG FILM Take 8.5 Film by mouth 3 (three) times daily. 09/25/16   [provider]  FLOVENT HFA 220 MCG/ACT inhaler Inhale 220 mcg into the lungs 2 (two) times daily. 12/10/17   [provider]  gabapentin (NEURONTIN) 300 MG capsule Take 300 mg by mouth daily. 09/19/16   [provider]   No Known Allergies  FAMILY HISTORY:  family history includes Cancer in her mother. SOCIAL HISTORY:  reports that she has been smoking cigarettes. She has a 44.00 pack-year smoking history. She has never used  smokeless tobacco. She reports that she has current or past drug history. Drug: Cocaine. She reports that she does not drink alcohol.  REVIEW OF SYSTEMS:   Unable to obtain due to critical illness   VITAL SIGNS: Temp:  [100.5 F (38.1 C)] 100.5 F (38.1 C) (11/17 0458) Pulse Rate:  [84-139] 84 (11/17 0702) Resp:  [17-20] 18 (11/17 0702) BP: (73-130)/(44-70) 101/67 (11/17 0702) SpO2:  [90 %-100 %] 100 % (11/17 0702) FiO2 (%):  [60 %] 60 % (11/17 0616) Weight:  [55.9 kg] 55.9 kg (11/17 0452)  Physical Examination:  GENERAL:critically ill appearing, +resp distress HEAD: Normocephalic, atraumatic.  EYES: Pupils equal, round, reactive to light.  No scleral icterus.  MOUTH: Moist mucosal membrane. NECK: Supple. No JVD.  PULMONARY: +rhonchi, +wheezing CARDIOVASCULAR: S1 and S2. Regular rate and rhythm. No murmurs, rubs, or gallops.  GASTROINTESTINAL: Soft, nontender, -distended. No masses. Positive bowel sounds. No hepatosplenomegaly.  MUSCULOSKELETAL: No swelling, clubbing, or edema.  NEUROLOGIC: obtunded SKIN:intact,warm,dry     ASSESSMENT / PLAN:  62 yo female with acute and severe resp distress with R Lung pneumonia and sepsis with severe resp failure on vent support  Seems to be necrotizing pneumonia of the RT lung   Severe Hypoxic and Hypercapnic Respiratory Failure -continue Full MV support -continue Bronchodilator Therapy -Wean Fio2 and PEEP as tolerated -will perform SAT/SBTTwhen respiratory parameters are met Obtain CT chest to assess RT lung  NEUROLOGY - intubated and sedated - minimal sedation to achieve a RASS goal: -1   ELECTROLYTES -follow labs as needed -replace  as needed -pharmacy consultation and following  ENDO - ICU hypoglycemic\Hyperglycemia protocol -check FSBS per protocol  INFECTIOUS DISEASE-RT lung pneumonia -continue antibiotics as prescribed -follow up cultures    DVT/GI PRX ordered TRANSFUSIONS AS NEEDED MONITOR FSBS ASSESS  the need for LABS as needed   Critical Care Time devoted to patient care services described in this note is 45 minutes.   Overall, patient is critically ill, prognosis is guarded.  Patient with Multiorgan failure and at high risk for cardiac arrest and death.    Corrin Parker, M.D.  [facility]  Medical Director [place] Director [facility] Cardio-Pulmonary Department

## 2018-10-24 NOTE — Progress Notes (Signed)
Per Dr. Belia HemanKasa, OG tube OK to use.

## 2018-10-24 NOTE — Progress Notes (Signed)
PHARMACY - PHYSICIAN COMMUNICATION CRITICAL VALUE ALERT - BLOOD CULTURE IDENTIFICATION (BCID)  Darrick HuntsmanSharon D Monroe is an 62 y.o. female who [facility] with a chief complaint of Headache   Assessment:  Lab called and said 1/4 bottles of the blood culture was positive and the BCID was positive for strep pneumoniae  (include suspected source if known)  Current antibiotics: cefepime 1g q12H   Changes to prescribed antibiotics recommended: Ceftriaxone 2 g q12H  Recommendations accepted by provider  Results for orders placed or performed during the hospital encounter of 10/24/18  Blood Culture ID Panel (Reflexed) (Collected: 10/24/2018  5:12 AM)  Result Value Ref Range   Enterococcus species NOT DETECTED NOT DETECTED   Listeria monocytogenes NOT DETECTED NOT DETECTED   Staphylococcus species NOT DETECTED NOT DETECTED   Staphylococcus aureus (BCID) NOT DETECTED NOT DETECTED   Streptococcus species DETECTED (A) NOT DETECTED   Streptococcus agalactiae NOT DETECTED NOT DETECTED   Streptococcus pneumoniae DETECTED (A) NOT DETECTED   Streptococcus pyogenes NOT DETECTED NOT DETECTED   Acinetobacter baumannii NOT DETECTED NOT DETECTED   Enterobacteriaceae species NOT DETECTED NOT DETECTED   Enterobacter cloacae complex NOT DETECTED NOT DETECTED   Escherichia coli NOT DETECTED NOT DETECTED   Klebsiella oxytoca NOT DETECTED NOT DETECTED   Klebsiella pneumoniae NOT DETECTED NOT DETECTED   Proteus species NOT DETECTED NOT DETECTED   Serratia marcescens NOT DETECTED NOT DETECTED   Haemophilus influenzae NOT DETECTED NOT DETECTED   Neisseria meningitidis NOT DETECTED NOT DETECTED   Pseudomonas aeruginosa NOT DETECTED NOT DETECTED   Candida albicans NOT DETECTED NOT DETECTED   Candida glabrata NOT DETECTED NOT DETECTED   Candida krusei NOT DETECTED NOT DETECTED   Candida parapsilosis NOT DETECTED NOT DETECTED   Candida tropicalis NOT DETECTED NOT DETECTED    Ronnald RampKishan S  [name], PharmD  Clinical Pharmacist 10/24/2018  9:32 PM

## 2018-10-24 NOTE — Consult Note (Signed)
Pharmacy Antibiotic Note  Kylie HuntsmanSharon D Gibbon is a 62 y.o. female admitted on 10/24/2018 with sepsis.  Pharmacy has been consulted for Cefepime dosing. In the ED patient received Ceftriaxone, Ganciclovir and Flagyl.   Plan: Start Cefepime 2g IV every 12 hours based on current CrCl <5060ml/min.   Height: 5\' 1"  (154.9 cm) Weight: 123 lb 3.8 oz (55.9 kg) IBW/kg (Calculated) : 47.8  Temp (24hrs), Avg:100.5 F (38.1 C), Min:100.5 F (38.1 C), Max:100.5 F (38.1 C)  Recent Labs  Lab 10/24/18 0512  WBC 12.5*  CREATININE 0.58  LATICACIDVEN 4.3*    Estimated Creatinine Clearance: 56.4 mL/min (by C-G formula based on SCr of 0.58 mg/dL).    No Known Allergies  Antimicrobials this admission: 11/17 Ceftriaxone >> x1 11/17 Ganciclovir>> 11/17 Cefepime>> 11/17 Flagyl>>   Microbiology results: 11/17 BCx: pending 11/17 UCx: pending  11/17 CSF Cx: pending  Thank you for allowing pharmacy to be a part of this patient's care.  Gardner CandleSheema M Elick Aguilera, PharmD, BCPS Clinical Pharmacist 10/24/2018 6:35 AM

## 2018-10-24 NOTE — Progress Notes (Signed)
ETT advanced 2cm per CXR and MD request

## 2018-10-24 NOTE — ED Notes (Signed)
Dr Lamont Snowballifenbark made aware of pt's elevated Troponin and Lactic Acid levels as reported by lab at this time. Troponin 0.04ng/mL; Lactic Acid 4.693mmol/L

## 2018-10-24 NOTE — Progress Notes (Signed)
Patient briefly seen and examined.  Husband at bedside. Patient currently sedated and intubated on ventilator with severe hypoxic hypercapnic respiratory failure due to pneumonia.  Continue management as per intensivist.

## 2018-10-24 NOTE — ED Provider Notes (Signed)
[facility] Emergency Department Provider Note  ____________________________________________   None    (approximate)  I have reviewed the triage vital signs and the nursing notes.   HISTORY  Chief Complaint Headache and Urinary Tract Infection  Level 5 exemption history limited by the patient's clinical condition  HPI Kylie Monroe is a 62 y.o. female who comes to the emergency department via EMS with altered mental status.  According to EMS the patient's husband said that she has been increasingly confused for the past several days.  She was tachycardic to 108 in route with a normal blood sugar and normal temperature.  No further history could be obtained as the patient is encephalopathic and unable to provide any meaningful history.  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   Her only complaint at this time is a headache unlike any headache she is ever had before although she is unable to quantify when it began or what it feels like.  Past Medical History:  Diagnosis Date  . COPD (chronic obstructive pulmonary disease) ([place])   . Hepatitis C   . Hypertension     Patient Active Problem List   Diagnosis Date Noted  . Acute respiratory failure with hypoxemia ([place]) 10/24/2018  . Leukopenia 01/24/2018  . Thrombocytopenia ([place]) 01/24/2018  . Other pancytopenia ([place]) 12/25/2017  . Iron deficiency anemia 12/25/2017    Past Surgical History:  Procedure Laterality Date  . CHOLECYSTECTOMY    . prolapsed rectum      Prior to Admission medications   Medication Sig Start Date End Date Taking? Authorizing Provider  albuterol (PROAIR HFA) 108 (90 Base) MCG/ACT inhaler Inhale 90 mcg into the lungs as needed. 01/25/13   [provider]  aspirin 81 MG chewable tablet Chew 81 mg by mouth daily. 09/19/16   [provider]  bisacodyl (DULCOLAX) 5 MG EC tablet Take 5 mg by mouth daily. 09/25/08   [provider]    Buprenorphine HCl-Naloxone HCl (SUBOXONE) 8-2 MG FILM Take 8.5 Film by mouth 3 (three) times daily. 09/25/16   [provider]  FLOVENT HFA 220 MCG/ACT inhaler Inhale 220 mcg into the lungs 2 (two) times daily. 12/10/17   [provider]  gabapentin (NEURONTIN) 300 MG capsule Take 300 mg by mouth daily. 09/19/16   [provider]    Allergies Patient has no known allergies.  Family History  Problem Relation Age of Onset  . Cancer Mother     Social History Social History   Tobacco Use  . Smoking status: Current Every Day Smoker    Packs/day: 1.00    Years: 44.00    Pack years: 44.00    Types: Cigarettes  . Smokeless tobacco: Never Used  Substance Use Topics  . Alcohol use: No  . Drug use: Yes    Types: Cocaine    Comment: valium    Review of Systems Level 5 exemption history limited by the patient's clinical condition  ____________________________________________   PHYSICAL EXAM:  VITAL SIGNS: ED Triage Vitals [10/24/18 0449]  Enc Vitals Group     BP      Pulse      Resp      Temp      Temp src      SpO2 93 %     Weight      Height      Head Circumference      Peak Flow      Pain  Score      Pain Loc      Pain Edu?      Excl. in [place]?     Constitutional: Appears critically ill.  Somewhat sonorous respirations groaning in pain.  She knows her name but does not know where she is or what is going on Eyes: PERRL EOMI. midrange and brisk Head: Atraumatic. Nose: No congestion/rhinnorhea. Mouth/Throat: No trismus Neck: No stridor.  Tremendous amount of discomfort when ranging her neck Cardiovascular: Tachycardic rate, regular rhythm. Grossly normal heart sounds.  Good peripheral circulation. Respiratory: Increased respiratory effort.  No retractions.  Decreased breath sounds on the right Gastrointestinal: Soft nontender Musculoskeletal: No lower extremity edema   Neurologic: Moves all 4 extremities Skin:  Skin is warm, dry and  intact. No rash noted. Psychiatric: Encephalopathic   ____________________________________________   DIFFERENTIAL includes but not limited to  Sepsis, meningitis, encephalitis, subdural hematoma, urinary tract infection ____________________________________________   LABS (all labs ordered are listed, but only abnormal results are displayed)  Labs Reviewed  ACETAMINOPHEN LEVEL - Abnormal; Notable for the following components:      Result Value   Acetaminophen (Tylenol), Serum <10 (*)    All other components within normal limits  COMPREHENSIVE METABOLIC PANEL - Abnormal; Notable for the following components:   Sodium 131 (*)    Chloride 90 (*)    BUN 30 (*)    Albumin 2.9 (*)    AST 59 (*)    Total Bilirubin 2.4 (*)    All other components within normal limits  LACTIC ACID, PLASMA - Abnormal; Notable for the following components:   Lactic Acid, Venous 4.3 (*)    All other components within normal limits  TROPONIN I - Abnormal; Notable for the following components:   Troponin I 0.04 (*)    All other components within normal limits  CBC WITH DIFFERENTIAL/PLATELET - Abnormal; Notable for the following components:   WBC 12.5 (*)    RDW 19.4 (*)    Platelets 121 (*)    nRBC 0.3 (*)    Neutro Abs 11.9 (*)    Lymphs Abs 0.3 (*)    All other components within normal limits  URINALYSIS, COMPLETE (UACMP) WITH MICROSCOPIC - Abnormal; Notable for the following components:   Color, Urine AMBER (*)    APPearance CLEAR (*)    Hgb urine dipstick SMALL (*)    Nitrite POSITIVE (*)    Bacteria, UA MANY (*)    All other components within normal limits  CSF CELL COUNT WITH DIFFERENTIAL - Abnormal; Notable for the following components:   RBC Count, CSF 10 (*)    All other components within normal limits  CSF CELL COUNT WITH DIFFERENTIAL - Abnormal; Notable for the following components:   RBC Count, CSF 10 (*)    All other components within normal limits  URINE DRUG SCREEN, QUALITATIVE  ([facility] ONLY) - Abnormal; Notable for the following components:   Cocaine Metabolite,Ur [place] POSITIVE (*)    All other components within normal limits  BLOOD GAS, ARTERIAL - Abnormal; Notable for the following components:   pO2, Arterial 59 (*)    Acid-Base Excess 2.1 (*)    All other components within normal limits  CSF CULTURE  CULTURE, BLOOD (ROUTINE X 2)  CULTURE, BLOOD (ROUTINE X 2)  URINE CULTURE  ETHANOL  SALICYLATE LEVEL  RAPID HIV SCREEN (HIV 1/2 AB+AG)  PROTEIN AND GLUCOSE, CSF  INFLUENZA PANEL BY PCR (TYPE A & B)  LACTIC ACID, PLASMA  Lab work reviewed by me with a number of abnormalities.  Most concerning is the very high lactic acid concerning for severe sepsis and septic shock.  Urinalysis is consistent with infection.  She is also cocaine positive __________________________________________  EKG  ED ECG REPORT I, Darel Hong, the attending physician, personally viewed and interpreted this ECG.  Date: 10/24/2018 EKG Time:  Rate: 104 Rhythm:  sinus tachycardia QRS Axis: normal Intervals: normal ST/T Wave abnormalities: normal Narrative Interpretation: no evidence of acute ischemia  ____________________________________________  RADIOLOGY  Head CT reviewed by me with no acute disease Chest x-ray reviewed by me shows endotracheal tube is a little bit high and what appears to be a large right-sided pneumonia ____________________________________________   PROCEDURES  Procedure(s) performed: Yes  .Critical Care Performed by: Darel Hong, MD Authorized by: Darel Hong, MD   Critical care provider statement:    Critical care time (minutes):  45   Critical care time was exclusive of:  Separately billable procedures and treating other patients   Critical care was necessary to treat or prevent imminent or life-threatening deterioration of the following conditions:  Sepsis and respiratory failure   Critical care was time spent personally by me on the  following activities:  Development of treatment plan with patient or surrogate, discussions with consultants, evaluation of patient's response to treatment, examination of patient, obtaining history from patient or surrogate, ordering and performing treatments and interventions, ordering and review of laboratory studies, ordering and review of radiographic studies, pulse oximetry, re-evaluation of patient's condition and review of old charts .Lumbar Puncture Date/Time: 10/24/2018 6:15 AM Performed by: Darel Hong, MD Authorized by: Darel Hong, MD   Consent:    Consent obtained:  Emergent situation Pre-procedure details:    Procedure purpose:  Diagnostic   Preparation: Patient was prepped and draped in usual sterile fashion   Sedation:    Sedation type:  Anxiolysis Anesthesia (see MAR for exact dosages):    Anesthesia method:  None Procedure details:    Lumbar space:  L3-L4 interspace   Patient position:  R lateral decubitus   Needle gauge:  18   Needle type:  Diamond point   Needle length (in):  3.5   Ultrasound guidance: no     Number of attempts:  1   Opening pressure (cm H2O):  12   Fluid appearance:  Clear   Tubes of fluid:  4 Post-procedure:    Puncture site:  Adhesive bandage applied   Patient tolerance of procedure:  Tolerated well, no immediate complications Procedure Name: Intubation Date/Time: 10/24/2018 6:15 AM Performed by: Darel Hong, MD Pre-anesthesia Checklist: Patient identified, Patient being monitored, Emergency Drugs available, Timeout performed and Suction available Oxygen Delivery Method: Non-rebreather mask Preoxygenation: Pre-oxygenation with 100% oxygen Induction Type: Rapid sequence Ventilation: Mask ventilation without difficulty Laryngoscope Size: Mac and 4 Grade View: Grade I Number of attempts: 1 Placement Confirmation: ETT inserted through vocal cords under direct vision,  CO2 detector and Breath sounds checked- equal and  bilateral Secured at: 22 cm Tube secured with: ETT holder Comments: Copious secretions on her cords and in her oropharynx during intubation    OG placement Date/Time: 10/24/2018 7:37 AM Performed by: Darel Hong, MD Authorized by: Darel Hong, MD  Consent: The procedure was performed in an emergent situation.  Sedation: Patient sedated: yes  Patient tolerance: Patient tolerated the procedure well with no immediate complications    Angiocath insertion Performed by: Darel Hong  Consent: Verbal consent obtained. Risks and benefits: risks, benefits and  alternatives were discussed Time out: Immediately prior to procedure a "time out" was called to verify the correct patient, procedure, equipment, support staff and site/side marked as required.  Preparation: Patient was prepped and draped in the usual sterile fashion.  Vein Location: Left upper extremity  Ultrasound Guided  Gauge: 16  Normal blood return and flush without difficulty Patient tolerance: Patient tolerated the procedure well with no immediate complications.    Critical Care performed: Yes  ____________________________________________   INITIAL IMPRESSION / ASSESSMENT AND PLAN / ED COURSE  Pertinent labs & imaging results that were available during my care of the patient were reviewed by me and considered in my medical decision making (see chart for details).   As part of my medical decision making, I reviewed the following data within the [place] History obtained from family if available, nursing notes, old chart and ekg, as well as notes from prior ED visits.  The patient comes to the emergency department profoundly altered.  She has mumbling incoherent speech and is chronically ill-appearing although this is apparently an acute change.  She felt warm to my touch despite a normal oral temperature in route so I checked a rectal temperature and it was 100.5 degrees raising  concern for sepsis.  I will also became concerned for meningitis and encephalitis so first antibiotics were 2 g of ceftriaxone along with acyclovir.  We will send off cultures, lactic acid, broad labs, and after head CT she will require a lumbar puncture.  There is no family around at this point.  Head CT was reassuring and she had normal platelets so I performed a lumbar puncture which initially had a brisk return of clear fluid however opening pressure was only 12.  She tolerated well.  Following the procedure she was noted to be hypoxic to about 80% on room air and coming up only to the mid 80s despite 6 L nasal cannula.  She was clearly aspirating so decision was made to intubate to protect her airway and she was intubated without complication.  Post intubation x-ray shows large right-sided pneumonia so I am actually expanding her antibiotics as her CSF did not appear to be bacterial meningitis.  I discussed with the hospitalist Dr. Marcille Blanco who has graciously agreed to admit the patient to his service.  Following intubation her husband arrived and I brought him into the room and kept him abreast of the critical nature of her illness.      ____________________________________________   FINAL CLINICAL IMPRESSION(S) / ED DIAGNOSES  Final diagnoses:  Septic shock ([place])      NEW MEDICATIONS STARTED DURING THIS VISIT:  New Prescriptions   No medications on file     Note:  This document was prepared using Dragon voice recognition software and may include unintentional dictation errors.     Darel Hong, MD 10/24/18 [phone]

## 2018-10-24 NOTE — H&P (Signed)
Kylie Monroe is an 62 y.o. female.   Chief Complaint: Headache HPI: The patient with past medical history of hypertension, hepatitis C and COPD presents emergency department complaining of headache.  The patient also complained of pain in her bottom.  Her husband reports the patient had been very weak today and had an episode of incontinence which prompted him to take her to call EMS for evaluation in the emergency department.  Patient was found to be febrile and met criteria for sepsis.  Due to her headache and wavering mental status emergency department performed a lumbar puncture after which the patient became hypoxic and was not protecting her airway.  She was subsequently intubated and placed on mechanical ventilation prior to the emergency department staff called the hospitalist service for admission.  Past Medical History:  Diagnosis Date  . COPD (chronic obstructive pulmonary disease) ([place])   . Hepatitis C   . Hypertension     Past Surgical History:  Procedure Laterality Date  . CHOLECYSTECTOMY    . prolapsed rectum      Family History  Problem Relation Age of Onset  . Cancer Mother    Social History:  reports that she has been smoking cigarettes. She has a 44.00 pack-year smoking history. She has never used smokeless tobacco. She reports that she has current or past drug history. Drug: Cocaine. She reports that she does not drink alcohol.  Allergies: No Known Allergies  Medications Prior to Admission  Medication Sig Dispense Refill  . albuterol (PROAIR HFA) 108 (90 Base) MCG/ACT inhaler Inhale 90 mcg into the lungs as needed.    Marland Kitchen aspirin 81 MG chewable tablet Chew 81 mg by mouth daily.    . bisacodyl (DULCOLAX) 5 MG EC tablet Take 5 mg by mouth daily.    . Buprenorphine HCl-Naloxone HCl (SUBOXONE) 8-2 MG FILM Take 8.5 Film by mouth 3 (three) times daily.    Marland Kitchen FLOVENT HFA 220 MCG/ACT inhaler Inhale 220 mcg into the lungs 2 (two) times daily.  5  . gabapentin (NEURONTIN)  300 MG capsule Take 300 mg by mouth daily.      Results for orders placed or performed during the hospital encounter of 10/24/18 (from the past 48 hour(s))  Urinalysis, Complete w Microscopic     Status: Abnormal   Collection Time: 10/24/18  4:56 AM  Result Value Ref Range   Color, Urine AMBER (A) YELLOW    Comment: BIOCHEMICALS MAY BE AFFECTED BY COLOR   APPearance CLEAR (A) CLEAR   Specific Gravity, Urine 1.021 1.005 - 1.030   pH 6.0 5.0 - 8.0   Glucose, UA NEGATIVE NEGATIVE mg/dL   Hgb urine dipstick SMALL (A) NEGATIVE   Bilirubin Urine NEGATIVE NEGATIVE   Ketones, ur NEGATIVE NEGATIVE mg/dL   Protein, ur NEGATIVE NEGATIVE mg/dL   Nitrite POSITIVE (A) NEGATIVE   Leukocytes, UA NEGATIVE NEGATIVE   RBC / HPF 0-5 0 - 5 RBC/hpf   WBC, UA 6-10 0 - 5 WBC/hpf   Bacteria, UA MANY (A) NONE SEEN   Squamous Epithelial / LPF 6-10 0 - 5   Mucus PRESENT     Comment: Performed at [facility], [address], [place], [place] [address]  Urine Drug Screen, Qualitative ([facility] only)     Status: Abnormal   Collection Time: 10/24/18  4:56 AM  Result Value Ref Range   Tricyclic, Ur Screen NONE DETECTED NONE DETECTED   Amphetamines, Ur Screen NONE DETECTED NONE DETECTED   MDMA (Ecstasy)Ur Screen  NONE DETECTED NONE DETECTED   Cocaine Metabolite,Ur [place] POSITIVE (A) NONE DETECTED   Opiate, Ur Screen NONE DETECTED NONE DETECTED   Phencyclidine (PCP) Ur S NONE DETECTED NONE DETECTED   Cannabinoid 50 Ng, Ur [place] NONE DETECTED NONE DETECTED   Barbiturates, Ur Screen NONE DETECTED NONE DETECTED   Benzodiazepine, Ur Scrn NONE DETECTED NONE DETECTED   Methadone Scn, Ur NONE DETECTED NONE DETECTED    Comment: (NOTE) Tricyclics + metabolites, urine    Cutoff 1000 ng/mL Amphetamines + metabolites, urine  Cutoff 1000 ng/mL MDMA (Ecstasy), urine              Cutoff 500 ng/mL Cocaine Metabolite, urine          Cutoff 300 ng/mL Opiate + metabolites, urine        Cutoff 300 ng/mL Phencyclidine (PCP),  urine         Cutoff 25 ng/mL Cannabinoid, urine                 Cutoff 50 ng/mL Barbiturates + metabolites, urine  Cutoff 200 ng/mL Benzodiazepine, urine              Cutoff 200 ng/mL Methadone, urine                   Cutoff 300 ng/mL The urine drug screen provides only a preliminary, unconfirmed analytical test result and should not be used for non-medical purposes. Clinical consideration and professional judgment should be applied to any positive drug screen result due to possible interfering substances. A more specific alternate chemical method must be used in order to obtain a confirmed analytical result. Gas chromatography / mass spectrometry (GC/MS) is the preferred confirmat ory method. Performed at [facility], [address] [place], [place] [address]   Acetaminophen level     Status: Abnormal   Collection Time: 10/24/18  5:12 AM  Result Value Ref Range   Acetaminophen (Tylenol), Serum <10 (L) 10 - 30 ug/mL    Comment: (NOTE) Therapeutic concentrations vary significantly. A range of 10-30 ug/mL  may be an effective concentration for many patients. However, some  are best treated at concentrations outside of this range. Acetaminophen concentrations >150 ug/mL at 4 hours after ingestion  and >50 ug/mL at 12 hours after ingestion are often associated with  toxic reactions. Performed at [facility], [address] [place], [place] [address]   Comprehensive metabolic panel     Status: Abnormal   Collection Time: 10/24/18  5:12 AM  Result Value Ref Range   Sodium 131 (L) 135 - 145 mmol/L   Potassium 3.6 3.5 - 5.1 mmol/L   Chloride 90 (L) 98 - 111 mmol/L   CO2 31 22 - 32 mmol/L   Glucose, Bld 90 70 - 99 mg/dL   BUN 30 (H) 6 - 20 mg/dL   Creatinine, Ser 0.58 0.44 - 1.00 mg/dL   Calcium 9.0 8.9 - 10.3 mg/dL   Total Protein 7.0 6.5 - 8.1 g/dL   Albumin 2.9 (L) 3.5 - 5.0 g/dL   AST 59 (H) 15 - 41 U/L   ALT 21 0 - 44 U/L   Alkaline Phosphatase 81  38 - 126 U/L   Total Bilirubin 2.4 (H) 0.3 - 1.2 mg/dL   GFR calc non Af Amer >60 >60 mL/min   GFR calc Af Amer >60 >60 mL/min    Comment: (NOTE) The eGFR has been calculated using the CKD EPI equation. This calculation has not been validated  in all clinical situations. eGFR's persistently <60 mL/min signify possible Chronic Kidney Disease.    Anion gap 10 5 - 15    Comment: Performed at [facility], [address] [place], [place] [address]  Ethanol     Status: None   Collection Time: 10/24/18  5:12 AM  Result Value Ref Range   Alcohol, Ethyl (B) <10 <10 mg/dL    Comment: (NOTE) Lowest detectable limit for serum alcohol is 10 mg/dL. For medical purposes only. Performed at [facility], [address] [place], [place] [address]   Salicylate level     Status: None   Collection Time: 10/24/18  5:12 AM  Result Value Ref Range   Salicylate Lvl <5.1 2.8 - 30.0 mg/dL    Comment: Performed at [facility], [address] [place], [place] [address]  Lactic acid, plasma     Status: Abnormal   Collection Time: 10/24/18  5:12 AM  Result Value Ref Range   Lactic Acid, Venous 4.3 (HH) 0.5 - 1.9 mmol/L    Comment: CRITICAL RESULT CALLED TO, READ BACK BY AND VERIFIED WITH BUTCH WOODS ON 10/24/18 AT [facility] Performed at [facility], [address] [place], [place] [address]   Troponin I - Once     Status: Abnormal   Collection Time: 10/24/18  5:12 AM  Result Value Ref Range   Troponin I 0.04 (HH) <0.03 ng/mL    Comment: CRITICAL RESULT CALLED TO, READ BACK BY AND VERIFIED WITH BUTCH WOODS ON 10/24/18 AT [facility] Performed at [facility], [address] [place], [place] [address]   CBC with Differential     Status: Abnormal   Collection Time: 10/24/18  5:12 AM  Result Value Ref Range   WBC 12.5 (H) 4.0 - 10.5 K/uL   RBC 4.37 3.87 - 5.11 MIL/uL   Hemoglobin 12.4 12.0 - 15.0 g/dL   HCT 39.3 36.0 - 46.0 %   MCV 89.9 80.0 - 100.0  fL   MCH 28.4 26.0 - 34.0 pg   MCHC 31.6 30.0 - 36.0 g/dL   RDW 19.4 (H) 11.5 - 15.5 %   Platelets 121 (L) 150 - 400 K/uL   nRBC 0.3 (H) 0.0 - 0.2 %   Neutrophils Relative % 93 %   Neutro Abs 11.9 (H) 1.7 - 7.7 K/uL   Lymphocytes Relative 3 %   Lymphs Abs 0.3 (L) 0.7 - 4.0 K/uL   Monocytes Relative 2 %   Monocytes Absolute 0.2 0.1 - 1.0 K/uL   Eosinophils Relative 1 %   Eosinophils Absolute 0.1 0.0 - 0.5 K/uL   Basophils Relative 0 %   Basophils Absolute 0.0 0.0 - 0.1 K/uL   Immature Granulocytes 1 %   Abs Immature Granulocytes 0.07 0.00 - 0.07 K/uL   Dimorphism PRESENT     Comment: Performed at [facility], [place], [place] [address]  Rapid HIV screen (HIV 1/2 Ab+Ag)     Status: None   Collection Time: 10/24/18  5:12 AM  Result Value Ref Range   HIV-1 P24 Antigen - HIV24 NON REACTIVE NON REACTIVE   HIV 1/2 Antibodies NON REACTIVE NON REACTIVE   Interpretation (HIV Ag Ab)      A non reactive test result means that HIV 1 or HIV 2 antibodies and HIV 1 p24 antigen were not detected in the specimen.    Comment: Performed at [facility], [address] [place], [place] [address]  Culture, blood (routine x 2)     Status: None (Preliminary result)   Collection Time: 10/24/18  5:12 AM  Result Value Ref Range   Specimen Description BLOOD RIGHT ANTECUBITAL    Special Requests      BOTTLES DRAWN AEROBIC AND ANAEROBIC Blood Culture results may not be optimal due to an excessive volume of blood received in culture bottles   Culture      NO GROWTH < 12 HOURS Performed at [facility], [address] [place], [place] [address]    Report Status PENDING   TSH     Status: None   Collection Time: 10/24/18  5:12 AM  Result Value Ref Range   TSH 0.525 0.350 - 4.500 uIU/mL    Comment: Performed by a 3rd Generation assay with a functional sensitivity of <=0.01 uIU/mL. Performed at [facility], [address] [place], [place]  [address]   CSF cell count with differential collection tube #: 1     Status: Abnormal   Collection Time: 10/24/18  5:54 AM  Result Value Ref Range   Tube # 1    Color, CSF COLORLESS COLORLESS   Appearance, CSF CLEAR CLEAR   RBC Count, CSF 10 (H) 0 - 3 /cu mm   WBC, CSF 2 0 - 5 /cu mm   Segmented Neutrophils-CSF 0 %   Lymphs, CSF 40 %   Monocyte-Macrophage-Spinal Fluid 60 %   Eosinophils, CSF 0 %   Other Cells, CSF 0     Comment: Performed at [facility], [address] [place], [place] [address]  CSF cell count with differential collection tube #: 4     Status: Abnormal   Collection Time: 10/24/18  5:54 AM  Result Value Ref Range   Tube # 4    Color, CSF COLORLESS COLORLESS   Appearance, CSF CLEAR CLEAR   RBC Count, CSF 10 (H) 0 - 3 /cu mm   WBC, CSF 2 0 - 5 /cu mm   Segmented Neutrophils-CSF 2 %   Lymphs, CSF 52 %   Monocyte-Macrophage-Spinal Fluid 46 %   Eosinophils, CSF 0 %   Other Cells, CSF 0     Comment: Performed at [facility], [address] [place], [place] [address]  CSF culture     Status: None (Preliminary result)   Collection Time: 10/24/18  5:54 AM  Result Value Ref Range   Specimen Description CSF    Special Requests NONE    Gram Stain      NO ORGANISMS SEEN RED BLOOD CELLS PRESENT WBC SEEN Performed at [facility], [address] [place], [place] [address]    Culture PENDING    Report Status PENDING   Protein and glucose, CSF     Status: None   Collection Time: 10/24/18  5:59 AM  Result Value Ref Range   Glucose, CSF 62 40 - 70 mg/dL   Total  Protein, CSF 20 15 - 45 mg/dL    Comment: Performed at [facility], [address] [place],  [address]  Blood gas, arterial ([facility] & [facility] ONLY)     Status: Abnormal   Collection Time: 10/24/18  6:40 AM  Result Value Ref Range   FIO2 0.60    Delivery systems VENTILATOR    Mode ASSIST CONTROL    VT 450 mL   pH, Arterial 7.38 7.350 - 7.450   pCO2 arterial 47  32.0 - 48.0 mmHg   pO2, Arterial 59 (  L) 83.0 - 108.0 mmHg   Bicarbonate 27.8 20.0 - 28.0 mmol/L   Acid-Base Excess 2.1 (H) 0.0 - 2.0 mmol/L   O2 Saturation 89.6 %   Patient temperature 37.0    Collection site RIGHT RADIAL    Sample type ARTERIAL DRAW    Allens test (pass/fail) PASS PASS   Mechanical Rate 16     Comment: Performed at [facility], [address] [place], [place] [address]  Influenza panel by PCR (type A & B)     Status: None   Collection Time: 10/24/18  6:41 AM  Result Value Ref Range   Influenza A By PCR NEGATIVE NEGATIVE   Influenza B By PCR NEGATIVE NEGATIVE    Comment: (NOTE) The Xpert Xpress Flu assay is intended as an aid in the diagnosis of  influenza and should not be used as a sole basis for treatment.  This  assay is FDA approved for nasopharyngeal swab specimens only. Nasal  washings and aspirates are unacceptable for Xpert Xpress Flu testing. Performed at [facility], [address] [place], [place] [address]   Glucose, capillary     Status: None   Collection Time: 10/24/18  8:03 AM  Result Value Ref Range   Glucose-Capillary 98 70 - 99 mg/dL   Ct Head Wo Contrast  Result Date: 10/24/2018 CLINICAL DATA:  Acute onset of headache. Altered level of consciousness. EXAM: CT HEAD WITHOUT CONTRAST TECHNIQUE: Contiguous axial images were obtained from the base of the skull through the vertex without intravenous contrast. COMPARISON:  CT of the head performed 11/03/2011 FINDINGS: Brain: No evidence of acute infarction, hemorrhage, hydrocephalus, extra-axial collection or mass lesion/mass effect. The posterior fossa, including the cerebellum, brainstem and fourth ventricle, is within normal limits. The third and lateral ventricles, and basal ganglia are unremarkable in appearance. The cerebral hemispheres are symmetric in appearance, with normal gray-white differentiation. No mass effect or midline shift is seen. Vascular: No hyperdense vessel  or unexpected calcification. Skull: There is no evidence of fracture; visualized osseous structures are unremarkable in appearance. Sinuses/Orbits: The visualized portions of the orbits are within normal limits. The paranasal sinuses and mastoid air cells are well-aerated. Other: No significant soft tissue abnormalities are seen. IMPRESSION: Unremarkable noncontrast CT of the head. Electronically Signed   By: Garald Balding M.D.   On: 10/24/2018 05:28   Dg Chest Portable 1 View  Result Date: 10/24/2018 CLINICAL DATA:  Endotracheal tube placement EXAM: PORTABLE CHEST 1 VIEW COMPARISON:  None. FINDINGS: Support Apparatus: --Endotracheal tube: Tip 5 cm above the inferior margin of the carina. --Enteric tube:Side port projects over the stomach. --Catheter(s):None --Other: None There is consolidation of the right lung base with air bronchograms. Small right basilar pleural effusion. The left lung is clear. Normal cardiomediastinal contours. IMPRESSION: 1. Tip of the endotracheal tube 5 cm above the inferior margin of the carina. Enteric tube side port projects over the stomach. 2. Right lung base consolidation with air bronchograms. Small right pleural effusion. Electronically Signed   By: Ulyses Jarred M.D.   On: 10/24/2018 06:24    Review of Systems  Unable to perform ROS: Intubated    Blood pressure 122/73, pulse (!) 109, temperature (!) 97.4 F (36.3 C), temperature source Axillary, resp. rate 17, height 5' 2"  (1.575 m), weight 56.6 kg, SpO2 90 %. Physical Exam  Vitals reviewed. Constitutional: She appears well-developed and well-nourished. No distress. She is intubated.  HENT:  Head: Normocephalic and atraumatic.  Mouth/Throat: Oropharynx is clear  and moist.  Eyes: Pupils are equal, round, and reactive to light. Conjunctivae and EOM are normal. No scleral icterus.  Neck: Normal range of motion. Neck supple. No JVD present. No tracheal deviation present. No thyromegaly present.  Cardiovascular:  Normal rate, regular rhythm and normal heart sounds. Exam reveals no gallop and no friction rub.  No murmur heard. Respiratory: She is intubated. She has rales in the right upper field.  GI: Soft. Bowel sounds are normal. She exhibits no distension. There is no tenderness.  Genitourinary:  Genitourinary Comments: Deferred  Musculoskeletal: Normal range of motion. She exhibits no edema.  Lymphadenopathy:    She has no cervical adenopathy.  Neurological: No cranial nerve deficit. She exhibits normal muscle tone.  Patient is intubated and sedated  Skin: Skin is warm and dry. No rash noted. No erythema.  Psychiatric:  Cannot assess any mental status as the patient is intubated and sedated     Assessment/Plan This is a 61 year old female admitted for respiratory failure. 1.  Respiratory failure: Acute; with hypoxia.  Pneumonia in the right upper lung.  Continue broad-spectrum antibiotics.  Wean supplemental oxygen as tolerated. 2.  Sepsis: Patient meets criteria via hypothermia, leukocytosis and tachycardia and intermittent tachypnea.  She has septic shock and is now on Levophed.  Follow blood cultures for growth and sensitivities.  The patient is also on ganciclovir for coverage of herpes cerebritis. 3.  Pneumonia: Community-acquired; narrow antibiotic coverage when possible. 4.  UTI: Present on admission; antibiotics as above 5.  COPD: Continue albuterol as needed.  Pulmicort through ventilation circuitry as well. 6.  DVT prophylaxis: Lovenox 7.  GI prophylaxis: GI following 24 hours of intubation The patient is a full code.  I personally spent 45 minutes in critical care time with this patient.   Harrie Foreman, MD 10/24/2018, 8:56 AM

## 2018-10-24 NOTE — ED Triage Notes (Signed)
Pt arrives to ED via [facility] from home with c/o headache, foul-smelling urine, and "bottom pain" Pt is slow to respond, states she had a recent fall at home but unable to provide specific details. Dr Lamont Snowballifenbark at bedside upon pt's arrival to ED.

## 2018-10-24 NOTE — ED Notes (Signed)
ED TO INPATIENT HANDOFF REPORT  Name/Age/Gender Kylie Monroe 62 y.o. female  Code Status   Home/SNF/Other Home  Chief Complaint EMS HA  Level of Care/Admitting Diagnosis ED Disposition    ED Disposition Condition [place]: [place] [[id number]]  Level of Care: ICU [6]  Diagnosis: Acute respiratory failure with hypoxemia [facility]) [[id number]]  Admitting Physician: Harrie Foreman [[id number]]  Attending Physician: Harrie Foreman [[id number]]  Estimated length of stay: past midnight tomorrow  Certification:: I certify this patient will need inpatient services for at least 2 midnights  PT Class (Do Not Modify): Inpatient [101]  PT Acc Code (Do Not Modify): Private [1]       Medical History Past Medical History:  Diagnosis Date  . COPD (chronic obstructive pulmonary disease) ([place])   . Hepatitis C   . Hypertension     Allergies No Known Allergies  IV Location/Drains/Wounds Patient Lines/Drains/Airways Status   Active Line/Drains/Airways    Name:   Placement date:   Placement time:   Site:   Days:   Peripheral IV 10/24/18 Right Antecubital   10/24/18    0515    Antecubital   less than 1   Peripheral IV 10/24/18 Left Hand   10/24/18    0515    Hand   less than 1   Peripheral IV 10/24/18 Right Hand   10/24/18    0626    Hand   less than 1   NG/OG Tube Orogastric 16 Fr. Right mouth Aucultation   10/24/18    0606    Right mouth   less than 1   Airway 7.5 mm   10/24/18    0610     less than 1          Labs/Imaging Results for orders placed or performed during the hospital encounter of 10/24/18 (from the past 48 hour(s))  Urinalysis, Complete w Microscopic     Status: Abnormal   Collection Time: 10/24/18  4:56 AM  Result Value Ref Range   Color, Urine AMBER (A) YELLOW    Comment: BIOCHEMICALS MAY BE AFFECTED BY COLOR   APPearance CLEAR (A) CLEAR   Specific Gravity, Urine 1.021 1.005 - 1.030   pH 6.0 5.0 - 8.0   Glucose, UA  NEGATIVE NEGATIVE mg/dL   Hgb urine dipstick SMALL (A) NEGATIVE   Bilirubin Urine NEGATIVE NEGATIVE   Ketones, ur NEGATIVE NEGATIVE mg/dL   Protein, ur NEGATIVE NEGATIVE mg/dL   Nitrite POSITIVE (A) NEGATIVE   Leukocytes, UA NEGATIVE NEGATIVE   RBC / HPF 0-5 0 - 5 RBC/hpf   WBC, UA 6-10 0 - 5 WBC/hpf   Bacteria, UA MANY (A) NONE SEEN   Squamous Epithelial / LPF 6-10 0 - 5   Mucus PRESENT     Comment: Performed at [facility], [address] [place], [place] [address]  Urine Drug Screen, Qualitative ([facility] only)     Status: Abnormal   Collection Time: 10/24/18  4:56 AM  Result Value Ref Range   Tricyclic, Ur Screen NONE DETECTED NONE DETECTED   Amphetamines, Ur Screen NONE DETECTED NONE DETECTED   MDMA (Ecstasy)Ur Screen NONE DETECTED NONE DETECTED   Cocaine Metabolite,Ur [place] POSITIVE (A) NONE DETECTED   Opiate, Ur Screen NONE DETECTED NONE DETECTED   Phencyclidine (PCP) Ur S NONE DETECTED NONE DETECTED   Cannabinoid 50 Ng, Ur [place] NONE DETECTED NONE DETECTED   Barbiturates, Ur Screen NONE DETECTED NONE DETECTED  Benzodiazepine, Ur Scrn NONE DETECTED NONE DETECTED   Methadone Scn, Ur NONE DETECTED NONE DETECTED    Comment: (NOTE) Tricyclics + metabolites, urine    Cutoff 1000 ng/mL Amphetamines + metabolites, urine  Cutoff 1000 ng/mL MDMA (Ecstasy), urine              Cutoff 500 ng/mL Cocaine Metabolite, urine          Cutoff 300 ng/mL Opiate + metabolites, urine        Cutoff 300 ng/mL Phencyclidine (PCP), urine         Cutoff 25 ng/mL Cannabinoid, urine                 Cutoff 50 ng/mL Barbiturates + metabolites, urine  Cutoff 200 ng/mL Benzodiazepine, urine              Cutoff 200 ng/mL Methadone, urine                   Cutoff 300 ng/mL The urine drug screen provides only a preliminary, unconfirmed analytical test result and should not be used for non-medical purposes. Clinical consideration and professional judgment should be applied to any positive drug screen  result due to possible interfering substances. A more specific alternate chemical method must be used in order to obtain a confirmed analytical result. Gas chromatography / mass spectrometry (GC/MS) is the preferred confirmat ory method. Performed at [facility], [address] [place], [place] [address]   Acetaminophen level     Status: Abnormal   Collection Time: 10/24/18  5:12 AM  Result Value Ref Range   Acetaminophen (Tylenol), Serum <10 (L) 10 - 30 ug/mL    Comment: (NOTE) Therapeutic concentrations vary significantly. A range of 10-30 ug/mL  may be an effective concentration for many patients. However, some  are best treated at concentrations outside of this range. Acetaminophen concentrations >150 ug/mL at 4 hours after ingestion  and >50 ug/mL at 12 hours after ingestion are often associated with  toxic reactions. Performed at [facility], [address] [place], [place] [address]   Comprehensive metabolic panel     Status: Abnormal   Collection Time: 10/24/18  5:12 AM  Result Value Ref Range   Sodium 131 (L) 135 - 145 mmol/L   Potassium 3.6 3.5 - 5.1 mmol/L   Chloride 90 (L) 98 - 111 mmol/L   CO2 31 22 - 32 mmol/L   Glucose, Bld 90 70 - 99 mg/dL   BUN 30 (H) 6 - 20 mg/dL   Creatinine, Ser 0.58 0.44 - 1.00 mg/dL   Calcium 9.0 8.9 - 10.3 mg/dL   Total Protein 7.0 6.5 - 8.1 g/dL   Albumin 2.9 (L) 3.5 - 5.0 g/dL   AST 59 (H) 15 - 41 U/L   ALT 21 0 - 44 U/L   Alkaline Phosphatase 81 38 - 126 U/L   Total Bilirubin 2.4 (H) 0.3 - 1.2 mg/dL   GFR calc non Af Amer >60 >60 mL/min   GFR calc Af Amer >60 >60 mL/min    Comment: (NOTE) The eGFR has been calculated using the CKD EPI equation. This calculation has not been validated in all clinical situations. eGFR's persistently <60 mL/min signify possible Chronic Kidney Disease.    Anion gap 10 5 - 15    Comment: Performed at [facility], [address] [place], [place] [address]   Ethanol     Status: None   Collection Time: 10/24/18  5:12 AM  Result Value Ref Range   Alcohol, Ethyl (B) <10 <10 mg/dL    Comment: (NOTE) Lowest detectable limit for serum alcohol is 10 mg/dL. For medical purposes only. Performed at [facility], [address] [place], [place] [address]   Salicylate level     Status: None   Collection Time: 10/24/18  5:12 AM  Result Value Ref Range   Salicylate Lvl <8.2 2.8 - 30.0 mg/dL    Comment: Performed at [facility], [address] [place], [place] [address]  Lactic acid, plasma     Status: Abnormal   Collection Time: 10/24/18  5:12 AM  Result Value Ref Range   Lactic Acid, Venous 4.3 (HH) 0.5 - 1.9 mmol/L    Comment: CRITICAL RESULT CALLED TO, READ BACK BY AND VERIFIED WITH BUTCH WOODS ON 10/24/18 AT [facility] Performed at [facility], [address] [place], [place] [address]   Troponin I - Once     Status: Abnormal   Collection Time: 10/24/18  5:12 AM  Result Value Ref Range   Troponin I 0.04 (HH) <0.03 ng/mL    Comment: CRITICAL RESULT CALLED TO, READ BACK BY AND VERIFIED WITH BUTCH WOODS ON 10/24/18 AT [facility] Performed at [facility], [address] [place], [place] [address]   CBC with Differential     Status: Abnormal   Collection Time: 10/24/18  5:12 AM  Result Value Ref Range   WBC 12.5 (H) 4.0 - 10.5 K/uL   RBC 4.37 3.87 - 5.11 MIL/uL   Hemoglobin 12.4 12.0 - 15.0 g/dL   HCT 39.3 36.0 - 46.0 %   MCV 89.9 80.0 - 100.0 fL   MCH 28.4 26.0 - 34.0 pg   MCHC 31.6 30.0 - 36.0 g/dL   RDW 19.4 (H) 11.5 - 15.5 %   Platelets 121 (L) 150 - 400 K/uL   nRBC 0.3 (H) 0.0 - 0.2 %   Neutrophils Relative % 93 %   Neutro Abs 11.9 (H) 1.7 - 7.7 K/uL   Lymphocytes Relative 3 %   Lymphs Abs 0.3 (L) 0.7 - 4.0 K/uL   Monocytes Relative 2 %   Monocytes Absolute 0.2 0.1 - 1.0 K/uL   Eosinophils Relative 1 %   Eosinophils Absolute 0.1 0.0 - 0.5 K/uL   Basophils Relative 0 %   Basophils  Absolute 0.0 0.0 - 0.1 K/uL   Immature Granulocytes 1 %   Abs Immature Granulocytes 0.07 0.00 - 0.07 K/uL   Dimorphism PRESENT     Comment: Performed at [facility], [place], [place] [address]  Rapid HIV screen (HIV 1/2 Ab+Ag)     Status: None   Collection Time: 10/24/18  5:12 AM  Result Value Ref Range   HIV-1 P24 Antigen - HIV24 NON REACTIVE NON REACTIVE   HIV 1/2 Antibodies NON REACTIVE NON REACTIVE   Interpretation (HIV Ag Ab)      A non reactive test result means that HIV 1 or HIV 2 antibodies and HIV 1 p24 antigen were not detected in the specimen.    Comment: Performed at [facility], [address] [place], [place] [address]  Culture, blood (routine x 2)     Status: None (Preliminary result)   Collection Time: 10/24/18  5:12 AM  Result Value Ref Range   Specimen Description BLOOD RIGHT ANTECUBITAL    Special Requests      BOTTLES DRAWN AEROBIC AND ANAEROBIC Blood Culture results may not be optimal  due to an excessive volume of blood received in culture bottles   Culture      NO GROWTH < 12 HOURS Performed at [facility], [address] [place], [place] [address]    Report Status PENDING   CSF cell count with differential collection tube #: 1     Status: Abnormal   Collection Time: 10/24/18  5:54 AM  Result Value Ref Range   Tube # 1    Color, CSF COLORLESS COLORLESS   Appearance, CSF CLEAR CLEAR   RBC Count, CSF 10 (H) 0 - 3 /cu mm   WBC, CSF 2 0 - 5 /cu mm   Segmented Neutrophils-CSF 0 %   Lymphs, CSF 40 %   Monocyte-Macrophage-Spinal Fluid 60 %   Eosinophils, CSF 0 %   Other Cells, CSF 0     Comment: Performed at [facility], [address] [place], [place] [address]  CSF cell count with differential collection tube #: 4     Status: Abnormal   Collection Time: 10/24/18  5:54 AM  Result Value Ref Range   Tube # 4    Color, CSF COLORLESS COLORLESS   Appearance, CSF CLEAR CLEAR   RBC Count, CSF 10 (H) 0  - 3 /cu mm   WBC, CSF 2 0 - 5 /cu mm   Segmented Neutrophils-CSF 2 %   Lymphs, CSF 52 %   Monocyte-Macrophage-Spinal Fluid 46 %   Eosinophils, CSF 0 %   Other Cells, CSF 0     Comment: Performed at [facility], [address] [place], [place] [address]  CSF culture     Status: None (Preliminary result)   Collection Time: 10/24/18  5:54 AM  Result Value Ref Range   Specimen Description CSF    Special Requests NONE    Gram Stain      NO ORGANISMS SEEN RED BLOOD CELLS PRESENT WBC SEEN Performed at [facility], [address] [place], [place] [address]    Culture PENDING    Report Status PENDING   Protein and glucose, CSF     Status: None   Collection Time: 10/24/18  5:59 AM  Result Value Ref Range   Glucose, CSF 62 40 - 70 mg/dL   Total  Protein, CSF 20 15 - 45 mg/dL    Comment: Performed at [facility], [address] [place], [place] [address]  Blood gas, arterial ([facility] & [facility] ONLY)     Status: Abnormal   Collection Time: 10/24/18  6:40 AM  Result Value Ref Range   FIO2 0.60    Delivery systems VENTILATOR    Mode ASSIST CONTROL    VT 450 mL   pH, Arterial 7.38 7.350 - 7.450   pCO2 arterial 47 32.0 - 48.0 mmHg   pO2, Arterial 59 (L) 83.0 - 108.0 mmHg   Bicarbonate 27.8 20.0 - 28.0 mmol/L   Acid-Base Excess 2.1 (H) 0.0 - 2.0 mmol/L   O2 Saturation 89.6 %   Patient temperature 37.0    Collection site RIGHT RADIAL    Sample type ARTERIAL DRAW    Allens test (pass/fail) PASS PASS   Mechanical Rate 16     Comment: Performed at [facility], [address] [place], [place] [address]  Influenza panel by PCR (type A & B)     Status: None   Collection Time: 10/24/18  6:41 AM  Result Value Ref Range   Influenza A By PCR NEGATIVE NEGATIVE  Influenza B By PCR NEGATIVE NEGATIVE    Comment: (NOTE) The Xpert Xpress Flu assay is intended as an aid in the diagnosis of  influenza and should not be used as a sole basis for treatment.  This   assay is FDA approved for nasopharyngeal swab specimens only. Nasal  washings and aspirates are unacceptable for Xpert Xpress Flu testing. Performed at [facility], [address] [place], [place] [address]    Ct Head Wo Contrast  Result Date: 10/24/2018 CLINICAL DATA:  Acute onset of headache. Altered level of consciousness. EXAM: CT HEAD WITHOUT CONTRAST TECHNIQUE: Contiguous axial images were obtained from the base of the skull through the vertex without intravenous contrast. COMPARISON:  CT of the head performed 11/03/2011 FINDINGS: Brain: No evidence of acute infarction, hemorrhage, hydrocephalus, extra-axial collection or mass lesion/mass effect. The posterior fossa, including the cerebellum, brainstem and fourth ventricle, is within normal limits. The third and lateral ventricles, and basal ganglia are unremarkable in appearance. The cerebral hemispheres are symmetric in appearance, with normal gray-white differentiation. No mass effect or midline shift is seen. Vascular: No hyperdense vessel or unexpected calcification. Skull: There is no evidence of fracture; visualized osseous structures are unremarkable in appearance. Sinuses/Orbits: The visualized portions of the orbits are within normal limits. The paranasal sinuses and mastoid air cells are well-aerated. Other: No significant soft tissue abnormalities are seen. IMPRESSION: Unremarkable noncontrast CT of the head. Electronically Signed   By: Garald Balding M.D.   On: 10/24/2018 05:28   Dg Chest Portable 1 View  Result Date: 10/24/2018 CLINICAL DATA:  Endotracheal tube placement EXAM: PORTABLE CHEST 1 VIEW COMPARISON:  None. FINDINGS: Support Apparatus: --Endotracheal tube: Tip 5 cm above the inferior margin of the carina. --Enteric tube:Side port projects over the stomach. --Catheter(s):None --Other: None There is consolidation of the right lung base with air bronchograms. Small right basilar pleural effusion. The left lung  is clear. Normal cardiomediastinal contours. IMPRESSION: 1. Tip of the endotracheal tube 5 cm above the inferior margin of the carina. Enteric tube side port projects over the stomach. 2. Right lung base consolidation with air bronchograms. Small right pleural effusion. Electronically Signed   By: Ulyses Jarred M.D.   On: 10/24/2018 06:24    Pending Labs Unresulted Labs (From admission, onward)    Start     Ordered   10/24/18 9798  Urine culture  Add-on,   AD     10/24/18 9211   10/24/18 0454  Culture, blood (routine x 2)  (Meningitis Panel)  BLOOD CULTURE X 2,   STAT     10/24/18 0453   10/24/18 0453  Lactic acid, plasma  Now then every 2 hours,   STAT     10/24/18 0453   Signed and Held  Creatinine, serum  (enoxaparin (LOVENOX)    CrCl >/= 30 ml/min)  Weekly,   R    Comments:  while on enoxaparin therapy    Signed and Held   Signed and Held  TSH  Add-on,   R     Signed and Held          Vitals/Pain Today's Vitals   10/24/18 0710 10/24/18 0715 10/24/18 0730 10/24/18 0733  BP: (!) 154/81 (!) 156/88 134/70   Pulse: (!) 107 99 100   Resp: 20 (!) 21 (!) 23   Temp:      TempSrc:      SpO2: 96% 96% 94% 97%  Weight:      Height:  PainSc:        Isolation Precautions No active isolations  Medications Medications  ganciclovir (CYTOVENE) 280 mg in sodium chloride 0.9 % 100 mL IVPB (0 mg/kg  55.9 kg Intravenous Stopped 10/24/18 0711)  metroNIDAZOLE (FLAGYL) IVPB 500 mg (500 mg Intravenous Transfusing/Transfer 10/24/18 0741)  norepinephrine (LEVOPHED) 39m in D5W 2521mpremix infusion (25 mcg/min Intravenous Rate/Dose Change 10/24/18 0740)  ceFEPIme (MAXIPIME) 2 g in sodium chloride 0.9 % 100 mL IVPB (has no administration in time range)  sodium chloride 0.9 % bolus 1,000 mL (1,000 mLs Intravenous Transfusing/Transfer 10/24/18 0741)  vasopressin (PITRESSIN) 40 Units in sodium chloride 0.9 % 250 mL (0.16 Units/mL) infusion (0 Units/min Intravenous Hold 10/24/18 0727)   cefTRIAXone (ROCEPHIN) 2 g in sodium chloride 0.9 % 100 mL IVPB (0 g Intravenous Stopped 10/24/18 0600)  acetaminophen (OFIRMEV) IV 1,000 mg (0 mg Intravenous Stopped 10/24/18 0555)  fentaNYL (SUBLIMAZE) injection 100 mcg (100 mcg Intravenous Given 10/24/18 0527)  prochlorperazine (COMPAZINE) injection 10 mg (10 mg Intravenous Given 10/24/18 0527)  diphenhydrAMINE (BENADRYL) injection 25 mg (25 mg Intravenous Given 10/24/18 0527)  sodium chloride 0.9 % bolus 1,000 mL (0 mLs Intravenous Stopped 10/24/18 0658)  fentaNYL 10 mcg/ml infusion (  Transfusing/Transfer 10/24/18 0731)  propofol (DIPRIVAN) 1000 MG/100ML infusion (  Transfusing/Transfer 10/24/18 0731)  fentaNYL (SUBLIMAZE) injection 100 mcg (100 mcg Intravenous Given by Other 10/24/18 0615)  hydrocortisone sodium succinate (SOLU-CORTEF) 100 MG injection 100 mg (100 mg Intravenous Given 10/24/18 0726)    Mobility walks

## 2018-10-25 ENCOUNTER — Inpatient Hospital Stay: Payer: Medicaid Other

## 2018-10-25 DIAGNOSIS — R6521 Severe sepsis with septic shock: Secondary | ICD-10-CM

## 2018-10-25 DIAGNOSIS — J181 Lobar pneumonia, unspecified organism: Secondary | ICD-10-CM

## 2018-10-25 DIAGNOSIS — A419 Sepsis, unspecified organism: Secondary | ICD-10-CM

## 2018-10-25 DIAGNOSIS — J189 Pneumonia, unspecified organism: Secondary | ICD-10-CM

## 2018-10-25 LAB — COMPREHENSIVE METABOLIC PANEL
ALT: 21 U/L (ref 0–44)
AST: 62 U/L — AB (ref 15–41)
Albumin: 2.1 g/dL — ABNORMAL LOW (ref 3.5–5.0)
Alkaline Phosphatase: 61 U/L (ref 38–126)
Anion gap: 5 (ref 5–15)
BUN: 21 mg/dL — ABNORMAL HIGH (ref 6–20)
CHLORIDE: 103 mmol/L (ref 98–111)
CO2: 29 mmol/L (ref 22–32)
Calcium: 8 mg/dL — ABNORMAL LOW (ref 8.9–10.3)
Creatinine, Ser: 0.48 mg/dL (ref 0.44–1.00)
Glucose, Bld: 136 mg/dL — ABNORMAL HIGH (ref 70–99)
POTASSIUM: 3.4 mmol/L — AB (ref 3.5–5.1)
SODIUM: 137 mmol/L (ref 135–145)
Total Bilirubin: 1.2 mg/dL (ref 0.3–1.2)
Total Protein: 5.5 g/dL — ABNORMAL LOW (ref 6.5–8.1)

## 2018-10-25 LAB — PHOSPHORUS: Phosphorus: 2.3 mg/dL — ABNORMAL LOW (ref 2.5–4.6)

## 2018-10-25 LAB — CBC
HCT: 32 % — ABNORMAL LOW (ref 36.0–46.0)
Hemoglobin: 9.8 g/dL — ABNORMAL LOW (ref 12.0–15.0)
MCH: 28.2 pg (ref 26.0–34.0)
MCHC: 30.6 g/dL (ref 30.0–36.0)
MCV: 92.2 fL (ref 80.0–100.0)
NRBC: 0.2 % (ref 0.0–0.2)
PLATELETS: 98 10*3/uL — AB (ref 150–400)
RBC: 3.47 MIL/uL — ABNORMAL LOW (ref 3.87–5.11)
RDW: 19.7 % — ABNORMAL HIGH (ref 11.5–15.5)
WBC: 17.5 10*3/uL — ABNORMAL HIGH (ref 4.0–10.5)

## 2018-10-25 LAB — GLUCOSE, CAPILLARY
Glucose-Capillary: 132 mg/dL — ABNORMAL HIGH (ref 70–99)
Glucose-Capillary: 138 mg/dL — ABNORMAL HIGH (ref 70–99)

## 2018-10-25 LAB — MAGNESIUM: MAGNESIUM: 2 mg/dL (ref 1.7–2.4)

## 2018-10-25 LAB — LACTIC ACID, PLASMA: LACTIC ACID, VENOUS: 2.1 mmol/L — AB (ref 0.5–1.9)

## 2018-10-25 LAB — TROPONIN I: TROPONIN I: 0.03 ng/mL — AB (ref <0.03)

## 2018-10-25 MED ORDER — SODIUM CHLORIDE 0.9 % IV SOLN
2.0000 g | INTRAVENOUS | Status: DC
  Administered 2018-10-26: 2 g via INTRAVENOUS
  Filled 2018-10-25: qty 2
  Filled 2018-10-25: qty 20

## 2018-10-25 MED ORDER — VITAL AF 1.2 CAL PO LIQD
1000.0000 mL | ORAL | Status: DC
  Administered 2018-10-25 – 2018-11-01 (×7): 1000 mL

## 2018-10-25 MED ORDER — ADULT MULTIVITAMIN LIQUID CH
15.0000 mL | Freq: Every day | ORAL | Status: DC
  Administered 2018-10-25 – 2018-11-02 (×8): 15 mL
  Filled 2018-10-25 (×10): qty 15

## 2018-10-25 MED ORDER — VITAL HIGH PROTEIN PO LIQD: 1000.0000 mL | ORAL | Status: DC

## 2018-10-25 MED ORDER — POTASSIUM PHOSPHATES 15 MMOLE/5ML IV SOLN
5.0000 mmol | Freq: Once | INTRAVENOUS | Status: AC
  Administered 2018-10-25: 5 mmol via INTRAVENOUS
  Filled 2018-10-25: qty 1.67

## 2018-10-25 MED ORDER — FREE WATER
100.0000 mL | Status: DC
  Administered 2018-10-25 – 2018-10-27 (×12): 100 mL

## 2018-10-25 MED ORDER — ALBUMIN HUMAN 25 % IV SOLN
25.0000 g | Freq: Once | INTRAVENOUS | Status: AC
  Administered 2018-10-25: 25 g via INTRAVENOUS
  Filled 2018-10-25: qty 100

## 2018-10-25 NOTE — Progress Notes (Signed)
MEDICATION RELATED CONSULT NOTE    Pharmacy Consult for Electrolyte Management Indication: hypokalemia  No Known Allergies  Patient Measurements: Height: 5\' 2"  (157.5 cm) Weight: 125 lb 14.1 oz (57.1 kg) IBW/kg (Calculated) : 50.1 Adjusted Body Weight:    Vital Signs: Temp: 98.2 F (36.8 C) (11/18 1200) Temp Source: Bladder (11/18 1200) BP: 97/52 (11/18 1400) Pulse Rate: 103 (11/18 1400) Intake/Output from previous day: 11/17 0701 - 11/18 0700 In: 6525.8 [I.V.:2776.6; IV Piggyback:3749.2] Out: 1425 [Urine:1425] Intake/Output from this shift: Total I/O In: 877.9 [I.V.:593; NG/GT:40.8; IV Piggyback:244] Out: 325 [Urine:325]  Labs: Recent Labs    10/24/18 0512 10/25/18 0618  WBC 12.5* 17.5*  HGB 12.4 9.8*  HCT 39.3 32.0*  PLT 121* 98*  CREATININE 0.58 0.48  MG  --  2.0  PHOS  --  2.3*  ALBUMIN 2.9* 2.1*  PROT 7.0 5.5*  AST 59* 62*  ALT 21 21  ALKPHOS 81 61  BILITOT 2.4* 1.2   Estimated Creatinine Clearance: 59.1 mL/min (by C-G formula based on SCr of 0.48 mg/dL).   Microbiology: Recent Results (from the past 720 hour(s))  Urine culture     Status: Abnormal (Preliminary result)   Collection Time: 10/24/18  4:56 AM  Result Value Ref Range Status   Specimen Description   Final    URINE, RANDOM Performed at [facility], [address] [place], [place] [address]    Special Requests   Final    NONE Performed at [facility], [address] [place], [place] [address]    Culture >=100,000 COLONIES/mL ESCHERICHIA COLI (A)  Final   Report Status PENDING  Incomplete  Culture, blood (routine x 2)     Status: None (Preliminary result)   Collection Time: 10/24/18  5:12 AM  Result Value Ref Range Status   Specimen Description BLOOD RIGHT ANTECUBITAL  Final   Special Requests   Final    BOTTLES DRAWN AEROBIC AND ANAEROBIC Blood Culture results may not be optimal due to an excessive volume of blood received in culture bottles   Culture  Setup  Time   Final    Organism ID to follow GRAM POSITIVE COCCI AEROBIC BOTTLE ONLY CRITICAL RESULT CALLED TO, READ BACK BY AND VERIFIED WITH: KISHAN PATEL @2118  10/24/18 AKT Performed at [facility], [address] [place], [place] [address]    Culture GRAM POSITIVE COCCI  Final   Report Status PENDING  Incomplete  Blood Culture ID Panel (Reflexed)     Status: Abnormal   Collection Time: 10/24/18  5:12 AM  Result Value Ref Range Status   Enterococcus species NOT DETECTED NOT DETECTED Final   Listeria monocytogenes NOT DETECTED NOT DETECTED Final   Staphylococcus species NOT DETECTED NOT DETECTED Final   Staphylococcus aureus (BCID) NOT DETECTED NOT DETECTED Final   Streptococcus species DETECTED (A) NOT DETECTED Final    Comment: CRITICAL RESULT CALLED TO, READ BACK BY AND VERIFIED WITH: KISHAN PATEL @2118  10/24/18 AKT    Streptococcus agalactiae NOT DETECTED NOT DETECTED Final   Streptococcus pneumoniae DETECTED (A) NOT DETECTED Final    Comment: CRITICAL RESULT CALLED TO, READ BACK BY AND VERIFIED WITH: KISHAN PATEL @2118  10/24/18 AKT    Streptococcus pyogenes NOT DETECTED NOT DETECTED Final   Acinetobacter baumannii NOT DETECTED NOT DETECTED Final   Enterobacteriaceae species NOT DETECTED NOT DETECTED Final   Enterobacter cloacae complex NOT DETECTED NOT DETECTED Final   Escherichia coli NOT DETECTED NOT DETECTED Final   Klebsiella oxytoca NOT DETECTED  NOT DETECTED Final   Klebsiella pneumoniae NOT DETECTED NOT DETECTED Final   Proteus species NOT DETECTED NOT DETECTED Final   Serratia marcescens NOT DETECTED NOT DETECTED Final   Haemophilus influenzae NOT DETECTED NOT DETECTED Final   Neisseria meningitidis NOT DETECTED NOT DETECTED Final   Pseudomonas aeruginosa NOT DETECTED NOT DETECTED Final   Candida albicans NOT DETECTED NOT DETECTED Final   Candida glabrata NOT DETECTED NOT DETECTED Final   Candida krusei NOT DETECTED NOT DETECTED Final   Candida parapsilosis  NOT DETECTED NOT DETECTED Final   Candida tropicalis NOT DETECTED NOT DETECTED Final    Comment: Performed at [facility], [address] [place], [place] [address]  CSF culture     Status: None (Preliminary result)   Collection Time: 10/24/18  5:54 AM  Result Value Ref Range Status   Specimen Description   Final    CSF Performed at [facility], [address] [place], [place] [address]    Special Requests   Final    NONE Performed at [facility], [address] [place], [place] [address]    Gram Stain   Final    NO ORGANISMS SEEN RED BLOOD CELLS PRESENT WBC SEEN CYTOSPIN SMEAR    Culture   Final    NO GROWTH 1 DAY Performed at [facility], [address] [place], [place] [address]    Report Status PENDING  Incomplete  MRSA PCR Screening     Status: None   Collection Time: 10/24/18  8:03 AM  Result Value Ref Range Status   MRSA by PCR NEGATIVE NEGATIVE Final    Comment:        The GeneXpert MRSA Assay (FDA approved for NASAL specimens only), is one component of a comprehensive MRSA colonization surveillance program. It is not intended to diagnose MRSA infection nor to guide or monitor treatment for MRSA infections. Performed at [facility], [address] [place], [place] [address]   Expectorated sputum assessment w rflx to resp cult     Status: None   Collection Time: 10/24/18  8:39 AM  Result Value Ref Range Status   Specimen Description SPUTUM  Final   Special Requests NONE  Final   Sputum evaluation   Final    THIS SPECIMEN IS ACCEPTABLE FOR SPUTUM CULTURE ENDOTRACHEAL SPUEVA CREDITED WITH RETAINED RESULTS. [facility] 10/24/18 AT 0858 Performed at [facility], [address] [place], [place] [address]    Report Status 10/24/2018 FINAL  Final  Culture, respiratory     Status: None (Preliminary result)   Collection Time: 10/24/18  8:39 AM  Result Value Ref Range Status   Specimen Description   Final     SPUTUM Performed at [facility], [address], [place], [place] [address]    Special Requests   Final    NONE Reflexed from [id number] Performed at [facility], [address] [place], [place] [address]    Gram Stain   Final    FEW WBC PRESENT, PREDOMINANTLY PMN FEW GRAM POSITIVE COCCI    Culture   Final    CULTURE REINCUBATED FOR BETTER GROWTH Performed at [facility], [address] [place], [place] [address]    Report Status PENDING  Incomplete    Assessment: Patient is a 62yo female admitted for respiratory distress. Pharmacy consulted for electrolyte management.  11/18 K=3.4, Phos=2.3, Mag=2.0  Goal of Therapy:  Maintain electrolytes within range  Plan:  Will  order KPhos 5 mmol IV times one. Follow up on AM labs.  Clovia Cuff, PharmD, BCPS 10/25/2018 3:39 PM

## 2018-10-25 NOTE — Progress Notes (Signed)
   Name: Kylie HuntsmanSharon D Monroe MRN: [id number] DOB: 11/19/1956     CONSULTATION DATE: 10/24/2018  Subjective & objectives: Propofol + fentanyl + norepinephrine drip.  Remains on a ventilator and febrile Tmax 101.3  PAST MEDICAL HISTORY :   has a past medical history of COPD (chronic obstructive pulmonary disease) (HCC), Hepatitis C, and Hypertension.  has a past surgical history that includes Cholecystectomy and prolapsed rectum. Prior to Admission medications   Medication Sig Start Date End Date Taking? Authorizing Provider  albuterol (PROAIR HFA) 108 (90 Base) MCG/ACT inhaler Inhale 90 mcg into the lungs as needed. 01/25/13   [provider]  aspirin 81 MG chewable tablet Chew 81 mg by mouth daily. 09/19/16   [provider]  bisacodyl (DULCOLAX) 5 MG EC tablet Take 5 mg by mouth daily. 09/25/08   [provider]  Buprenorphine HCl-Naloxone HCl (SUBOXONE) 8-2 MG FILM Take 8.5 Film by mouth 3 (three) times daily. 09/25/16   [provider]  FLOVENT HFA 220 MCG/ACT inhaler Inhale 220 mcg into the lungs 2 (two) times daily. 12/10/17   [provider]  gabapentin (NEURONTIN) 300 MG capsule Take 300 mg by mouth daily. 09/19/16   [provider]   No Known Allergies  FAMILY HISTORY:  family history includes Cancer in her mother. SOCIAL HISTORY:  reports that she has been smoking cigarettes. She has a 44.00 pack-year smoking history. She has never used smokeless tobacco. She reports that she has current or past drug history. Drug: Cocaine. She reports that she does not drink alcohol.  REVIEW OF SYSTEMS:   Unable to obtain due to critical illness   VITAL SIGNS: Temp:  [97.9 F (36.6 C)-101.3 F (38.5 C)] 98.4 F (36.9 C) (11/18 0800) Pulse Rate:  [87-127] 93 (11/18 0800) Resp:  [9-24] 17 (11/18 0800) BP: (89-109)/(48-74) 95/54 (11/18 0800) SpO2:  [92 %-98 %] 96 % (11/18 0800) FiO2 (%):  [60 %] 60 % (11/18 0824) Weight:  [57.1 kg] 57.1 kg  (11/18 0419)  Physical Examination:  Sedated with propofol and fentanyl to RASS of -3 On vent, no distress, bilateral equal air entry with no adventitious sounds S1 & S2 are audible with no murmur Benign abdominal exam with feeble peristalsis No leg edema  ASSESSMENT / PLAN:  Acute respiratory failure. -SAT + SBT as tolerated -Monitor ABG, optimize vent settings and continuous ventilator support  Pneumonia.  Worsening bilateral airspace disease Rt > Lt with Pl. eff. respiratory culture growing GPC.  MRSA PCR negative -Rocephin.  Monitor CXR + CBC + FiO2  COPD -Optimize bronchodilators + inhaled steroids and tapering systemic steroids  UTI -Rocephin.  Monitor urine culture  Septic shock with Streptococcus pneumonia bacteremia -Optimize volume, pressors and monitor hemodynamics -Continue with Rocephin.  Sepsis with lactic acidemia. Streptococcus pneumonia bacteremia. Prerenal azotemia -Rocephin. -Optimize hydration, monitor lactic acid, cultures and procalcitonin  Altered mental status with cocaine intoxication.  CT head negative for acute intracranial abnormalities -Initial CSF work-up was negative -Consider DC acyclovir as final CSF culture report  Hypertriglyceridemia due to propofol -Optimize propofol and monitor triglycerides  Anemia -Keep hemoglobin more than 7 g/dL  Thrombocytopenia -Monitor platelet count  Hypokalemia and hypophosphatemia -Replete and monitor electrolytes  Full code  DVT & GI prophylaxis.  Continue with supportive care.   [name] was updated  Critical care time 45 min

## 2018-10-25 NOTE — Progress Notes (Signed)
[facility] - [place] at [facility]   PATIENT NAME: Lurlean LeydenSharon Totty    MR#:  [id number]  DATE OF BIRTH:  08/29/1956  SUBJECTIVE:   patient awake on vent Family at bedside  REVIEW OF SYSTEMS:    Unable to obtain patient intubated    Tolerating Diet:NPO      DRUG ALLERGIES:  No Known Allergies  VITALS:  Blood pressure 103/61, pulse (!) 106, temperature 98.4 F (36.9 C), temperature source Bladder, resp. rate 17, height 5\' 2"  (1.575 m), weight 57.1 kg, SpO2 96 %.  PHYSICAL EXAMINATION:  Constitutional: Appears thin intubated awake but not following commands. No distress. HENT: Normocephalic. Eyes: Conjunctivae  are normal. PERRLA, no scleral icterus.  Neck: Normal ROM. Neck supple. No JVD. No tracheal deviation. CVS: RRR, S1/S2 +, no murmurs, no gallops, no carotid bruit.  Pulmonary: Effort and breath sounds normal, no stridor, rhonchi, wheezes, rales.  Abdominal: Soft. BS +,  no distension, tenderness, rebound or guarding.  Musculoskeletal:  No edema and no tenderness.  Neuro: awake on vent. t. No focal deficits. Skin: Skin is warm and dry. No rash noted.     LABORATORY PANEL:   CBC Recent Labs  Lab 10/25/18 0618  WBC 17.5*  HGB 9.8*  HCT 32.0*  PLT 98*   ------------------------------------------------------------------------------------------------------------------  Chemistries  Recent Labs  Lab 10/25/18 0618  NA 137  K 3.4*  CL 103  CO2 29  GLUCOSE 136*  BUN 21*  CREATININE 0.48  CALCIUM 8.0*  MG 2.0  AST 62*  ALT 21  ALKPHOS 61  BILITOT 1.2   ------------------------------------------------------------------------------------------------------------------  Cardiac Enzymes Recent Labs  Lab 10/24/18 0512 10/25/18 0618  TROPONINI 0.04* 0.03*   ------------------------------------------------------------------------------------------------------------------  RADIOLOGY:  Ct Head Wo Contrast  Result Date:  10/24/2018 CLINICAL DATA:  Acute onset of headache. Altered level of consciousness. EXAM: CT HEAD WITHOUT CONTRAST TECHNIQUE: Contiguous axial images were obtained from the base of the skull through the vertex without intravenous contrast. COMPARISON:  CT of the head performed 11/03/2011 FINDINGS: Brain: No evidence of acute infarction, hemorrhage, hydrocephalus, extra-axial collection or mass lesion/mass effect. The posterior fossa, including the cerebellum, brainstem and fourth ventricle, is within normal limits. The third and lateral ventricles, and basal ganglia are unremarkable in appearance. The cerebral hemispheres are symmetric in appearance, with normal gray-white differentiation. No mass effect or midline shift is seen. Vascular: No hyperdense vessel or unexpected calcification. Skull: There is no evidence of fracture; visualized osseous structures are unremarkable in appearance. Sinuses/Orbits: The visualized portions of the orbits are within normal limits. The paranasal sinuses and mastoid air cells are well-aerated. Other: No significant soft tissue abnormalities are seen. IMPRESSION: Unremarkable noncontrast CT of the head. Electronically Signed   By: Roanna RaiderJeffery  Chang M.D.   On: 10/24/2018 05:28   Dg Chest Port 1 View  Result Date: 10/25/2018 CLINICAL DATA:  62 year old female with acute respiratory failure. Subsequent encounter. EXAM: PORTABLE CHEST 1 VIEW COMPARISON:  10/24/2018 chest x-ray. FINDINGS: Endotracheal tube tip 2.1 cm above the carina. Nasogastric tube side hole gastric fundus-body level. Tip not imaged on the present exam. Almost complete consolidation right thorax with sparing right apical region. Progressive pulmonary vascular congestion left lung. Cardiomegaly.  Calcified mildly tortuous aorta. IMPRESSION: 1. Persistent consolidation majority of right thorax with sparing right lung apex. This may represent infectious infiltrate possibly with associated pleural effusion. 2.  Progressive pulmonary vascular congestion left lung. 3. Endotracheal tube tip 2.1 cm above the carina. 4.  Aortic Atherosclerosis (ICD10-I70.0). Electronically  Signed   By: Lacy Duverney M.D.   On: 10/25/2018 08:03   Dg Chest Portable 1 View  Result Date: 10/24/2018 CLINICAL DATA:  Endotracheal tube placement EXAM: PORTABLE CHEST 1 VIEW COMPARISON:  None. FINDINGS: Support Apparatus: --Endotracheal tube: Tip 5 cm above the inferior margin of the carina. --Enteric tube:Side port projects over the stomach. --Catheter(s):None --Other: None There is consolidation of the right lung base with air bronchograms. Small right basilar pleural effusion. The left lung is clear. Normal cardiomediastinal contours. IMPRESSION: 1. Tip of the endotracheal tube 5 cm above the inferior margin of the carina. Enteric tube side port projects over the stomach. 2. Right lung base consolidation with air bronchograms. Small right pleural effusion. Electronically Signed   By: Deatra Robinson M.D.   On: 10/24/2018 06:24     ASSESSMENT AND PLAN:   62 year old female with history of COPD presented with shortness of breath.  1.  Acute hypoxic respiratory failure requiring intubation due to pneumonia and COPD exacerbation: Plan for weaning trial today Continue steroids 2.  Streptococcus pneumonia bacteremia with septic shock: Patient only on Rocephin. Would recommend adding azithromycin Keep map greater than 65 Follow-up on final blood cultures  3.  Gram-negative UTI: Follow-up on urine culture Continue Rocephin  4.  Acute encephalopathy with cocaine intoxication: Patient is status post lumbar puncture in the emergency room Follow-up on final cultures Initial CSF work-up was negative Likely can discontinue ganciclovir 5.  Electrolytes: Replete as needed  6.  Thrombocytopenia from septic shock: Monitor closely  7.  Urine toxicology positive for cocaine Management plans discussed with the patient's husband and he  is CODE STATUS: Full  TOTAL TIME TAKING CARE OF THIS PATIENT: 29 minutes.     POSSIBLE D/C 2 to 4 days, DEPENDING ON CLINICAL CONDITION.   Carl Butner M.D on 10/25/2018 at 11:34 AM  Between 7am to 6pm - Pager - [phone] After 6pm go to [email or web address] - Social research officer, government  [facility]  Office  [phone]  CC: Primary care physician; Donaciano Eva, FNP  Note: This dictation was prepared with Dragon dictation along with smaller phrase technology. Any transcriptional errors that result from this process are unintentional.

## 2018-10-25 NOTE — Progress Notes (Signed)
Initial Nutrition Assessment  DOCUMENTATION CODES:   Non-severe (moderate) malnutrition in context of chronic illness  INTERVENTION:  Initiate Vital AF 1.2 at 50 mL/hr (1200 mL goal daily volume) per OGT. Provides 1440 kcal, 90 grams of protein, 972 mL H2O daily. With current propofol rate provides 1572 kcal daily.  Provide free water flush of 100 mL Q4hrs per OGT. Provides a total of 1572 mL H2O daily including water in tube feeding.  Provide liquid MVI daily per tube.  NUTRITION DIAGNOSIS:   Moderate Malnutrition related to chronic illness(COPD, hepatitis C) as evidenced by moderate fat depletion, moderate muscle depletion.  GOAL:   Provide needs based on ASPEN/SCCM guidelines  MONITOR:   Vent status, Labs, Weight trends, TF tolerance, I & O's  REASON FOR ASSESSMENT:   Ventilator    ASSESSMENT:   62 year old female with PMHx of hepatitis C, HTN, COPD, hx cholecystectomy admitted with respiratory failure requiring intubation on 11/17, PNA, UTI, septic shock, AMS with cocaine intoxication.   Patient intubated and sedated. On PRVC mode with FiO2 60% and PEEP 5 cmH2O. Abdomen soft. Limited weight history in chart. She was 45.4 kg on 11/06/2015, 49.9 kg on 12/06/2015, 69.1 kg on 12/25/2017, and is currently 57.1 kg (125.88 lbs).  Enteral Access: 16 Fr. OGT placed 11/17; terminates in stomach per chest x-ray 11/18; 52 cm at corner of mouth  MAP: 62-73 mmHg  Patient is currently intubated on ventilator support Ve: 9.6 L/min Temp (24hrs), Avg:99.4 F (37.4 C), Min:97.9 F (36.6 C), Max:101.3 F (38.5 C)  Propofol: 5 mL/hr (132 kcal daily)  Medications reviewed and include: Colace 100 mg daily, Solu-Medrol 40 mg Q12hrs IV, NS @ 25 mL/hr, human albumin 25 grams once IV, ceftriaxone, famotidine, fentanyl gtt, ganciclovir, propofol gtt.  Labs reviewed: CBG 98, Potassium 3.4, BUN 21, Phosphorus 2.3, Triglycerides 154.  I/O: 1425 mL UOP yesterday (1 mL/kg/hr)  Discussed with  RN and on rounds. Plan is to start enteral nutrition today and free water per tube.  NUTRITION - FOCUSED PHYSICAL EXAM:    Most Recent Value  Orbital Region  Moderate depletion  Upper Arm Region  Moderate depletion  Thoracic and Lumbar Region  Moderate depletion  Buccal Region  Unable to assess  Temple Region  Severe depletion  Clavicle Bone Region  Moderate depletion  Clavicle and Acromion Bone Region  Moderate depletion  Scapular Bone Region  Unable to assess  Dorsal Hand  Moderate depletion  Patellar Region  Mild depletion  Anterior Thigh Region  Mild depletion  Posterior Calf Region  Moderate depletion  Edema (RD Assessment)  None  Hair  Reviewed  Eyes  Unable to assess  Mouth  Unable to assess  Skin  Reviewed  Nails  Reviewed     Diet Order:   Diet Order            Diet NPO time specified Except for: Sips with Meds  Diet effective now             EDUCATION NEEDS:   No education needs have been identified at this time  Skin:  Skin Assessment: Reviewed RN Assessment  Last BM:  Unknown/PTA  Height:   Ht Readings from Last 1 Encounters:  10/24/18 5\' 2"  (1.575 m)   Weight:   Wt Readings from Last 1 Encounters:  10/25/18 57.1 kg   Ideal Body Weight:  50 kg  BMI:  Body mass index is 23.02 kg/m.  Estimated Nutritional Needs:   Kcal:  1570 (PSU  2003b w/ MSJ 1099, Ve 9.6, Tmax 38.5)  Protein:  80-95 grams (1.4-1.7 grams/kg)  Fluid:  1.4-1.7 L/day (25-30 mL/kg)  Helane Rima, MS, RD, LDN Office: [phone] Pager: [phone] After Hours/Weekend Pager: [phone]

## 2018-10-26 ENCOUNTER — Inpatient Hospital Stay: Payer: Medicaid Other

## 2018-10-26 DIAGNOSIS — E440 Moderate protein-calorie malnutrition: Secondary | ICD-10-CM

## 2018-10-26 LAB — TRIGLYCERIDES: TRIGLYCERIDES: 117 mg/dL (ref <150)

## 2018-10-26 LAB — CBC WITH DIFFERENTIAL/PLATELET
ABS IMMATURE GRANULOCYTES: 0.35 10*3/uL — AB (ref 0.00–0.07)
Abs Immature Granulocytes: 0.46 10*3/uL — ABNORMAL HIGH (ref 0.00–0.07)
BASOS ABS: 0.1 10*3/uL (ref 0.0–0.1)
BASOS ABS: 0.1 10*3/uL (ref 0.0–0.1)
BASOS PCT: 1 %
Basophils Relative: 1 %
EOS ABS: 0 10*3/uL (ref 0.0–0.5)
EOS PCT: 0 %
Eosinophils Absolute: 0 10*3/uL (ref 0.0–0.5)
Eosinophils Relative: 0 %
HCT: 27.6 % — ABNORMAL LOW (ref 36.0–46.0)
HEMATOCRIT: 29.4 % — AB (ref 36.0–46.0)
HEMOGLOBIN: 9 g/dL — AB (ref 12.0–15.0)
Hemoglobin: 8.6 g/dL — ABNORMAL LOW (ref 12.0–15.0)
IMMATURE GRANULOCYTES: 4 %
Immature Granulocytes: 5 %
LYMPHS ABS: 0.2 10*3/uL — AB (ref 0.7–4.0)
Lymphocytes Relative: 3 %
Lymphocytes Relative: 5 %
Lymphs Abs: 0.4 10*3/uL — ABNORMAL LOW (ref 0.7–4.0)
MCH: 28.2 pg (ref 26.0–34.0)
MCH: 28.4 pg (ref 26.0–34.0)
MCHC: 31.2 g/dL (ref 30.0–36.0)
MCHC: 30.6 g/dL (ref 30.0–36.0)
MCV: 90.5 fL (ref 80.0–100.0)
MCV: 92.7 fL (ref 80.0–100.0)
MONOS PCT: 2 %
Monocytes Absolute: 0.2 10*3/uL (ref 0.1–1.0)
Monocytes Absolute: 0.2 10*3/uL (ref 0.1–1.0)
Monocytes Relative: 2 %
NEUTROS PCT: 90 %
NRBC: 0.8 % — AB (ref 0.0–0.2)
NRBC: 0.6 % — AB (ref 0.0–0.2)
Neutro Abs: 7.7 10*3/uL (ref 1.7–7.7)
Neutro Abs: 8.1 10*3/uL — ABNORMAL HIGH (ref 1.7–7.7)
Neutrophils Relative %: 87 %
PLATELETS: 87 10*3/uL — AB (ref 150–400)
Platelets: 92 10*3/uL — ABNORMAL LOW (ref 150–400)
RBC: 3.05 MIL/uL — ABNORMAL LOW (ref 3.87–5.11)
RBC: 3.17 MIL/uL — AB (ref 3.87–5.11)
RDW: 20 % — AB (ref 11.5–15.5)
RDW: 20.1 % — AB (ref 11.5–15.5)
SMEAR REVIEW: NORMAL
Smear Review: NORMAL
WBC: 8.5 10*3/uL (ref 4.0–10.5)
WBC: 9.3 10*3/uL (ref 4.0–10.5)

## 2018-10-26 LAB — COMPREHENSIVE METABOLIC PANEL
ALBUMIN: 2.5 g/dL — AB (ref 3.5–5.0)
ALT: 18 U/L (ref 0–44)
AST: 48 U/L — AB (ref 15–41)
Alkaline Phosphatase: 77 U/L (ref 38–126)
Anion gap: 5 (ref 5–15)
BILIRUBIN TOTAL: 0.7 mg/dL (ref 0.3–1.2)
BUN: 27 mg/dL — AB (ref 6–20)
CHLORIDE: 106 mmol/L (ref 98–111)
CO2: 30 mmol/L (ref 22–32)
Calcium: 8.3 mg/dL — ABNORMAL LOW (ref 8.9–10.3)
Creatinine, Ser: 0.36 mg/dL — ABNORMAL LOW (ref 0.44–1.00)
GFR calc Af Amer: >60 mL/min (ref >60)
GFR calc non Af Amer: >60 mL/min (ref >60)
GLUCOSE: 155 mg/dL — AB (ref 70–99)
POTASSIUM: 3.6 mmol/L (ref 3.5–5.1)
SODIUM: 141 mmol/L (ref 135–145)
Total Protein: 5.9 g/dL — ABNORMAL LOW (ref 6.5–8.1)

## 2018-10-26 LAB — BLOOD GAS, ARTERIAL
ACID-BASE EXCESS: 5.1 mmol/L — AB (ref 0.0–2.0)
Bicarbonate: 30.9 mmol/L — ABNORMAL HIGH (ref 20.0–28.0)
FIO2: 0.6
O2 Saturation: 94.3 %
PEEP/CPAP: 5 cmH2O
Patient temperature: 37
RATE: 16 resp/min
VT: 450 mL
pCO2 arterial: 51 mmHg — ABNORMAL HIGH (ref 32.0–48.0)
pH, Arterial: 7.39 (ref 7.350–7.450)
pO2, Arterial: 73 mmHg — ABNORMAL LOW (ref 83.0–108.0)

## 2018-10-26 LAB — URINE CULTURE: Culture: >=100000 — AB

## 2018-10-26 LAB — GLUCOSE, CAPILLARY
Glucose-Capillary: 111 mg/dL — ABNORMAL HIGH (ref 70–99)
Glucose-Capillary: 115 mg/dL — ABNORMAL HIGH (ref 70–99)
Glucose-Capillary: 121 mg/dL — ABNORMAL HIGH (ref 70–99)
Glucose-Capillary: 129 mg/dL — ABNORMAL HIGH (ref 70–99)
Glucose-Capillary: 141 mg/dL — ABNORMAL HIGH (ref 70–99)
Glucose-Capillary: 141 mg/dL — ABNORMAL HIGH (ref 70–99)

## 2018-10-26 LAB — LACTIC ACID, PLASMA: Lactic Acid, Venous: 1.6 mmol/L (ref 0.5–1.9)

## 2018-10-26 LAB — PHOSPHORUS: Phosphorus: 2.3 mg/dL — ABNORMAL LOW (ref 2.5–4.6)

## 2018-10-26 LAB — MAGNESIUM: Magnesium: 2.4 mg/dL (ref 1.7–2.4)

## 2018-10-26 MED ORDER — FAMOTIDINE 20 MG PO TABS
20.0000 mg | ORAL_TABLET | Freq: Every day | ORAL | Status: DC
  Administered 2018-10-26 – 2018-11-07 (×12): 20 mg
  Filled 2018-10-26 (×12): qty 1

## 2018-10-26 MED ORDER — POTASSIUM & SODIUM PHOSPHATES 280-160-250 MG PO PACK
1.0000 | PACK | Freq: Three times a day (TID) | ORAL | Status: AC
  Administered 2018-10-26 (×3): 1
  Filled 2018-10-26 (×3): qty 1

## 2018-10-26 NOTE — Progress Notes (Signed)
MEDICATION RELATED CONSULT NOTE    Pharmacy Consult for Electrolyte Management Indication: hypokalemia  Labs: Recent Labs    10/24/18 0512 10/25/18 0618 10/26/18 0540  WBC 12.5* 17.5* 8.5  HGB 12.4 9.8* 8.6*  HCT 39.3 32.0* 27.6*  PLT 121* 98* 87*  CREATININE 0.58 0.48 0.36*  MG  --  2.0 2.4  PHOS  --  2.3* 2.3*  ALBUMIN 2.9* 2.1* 2.5*  PROT 7.0 5.5* 5.9*  AST 59* 62* 48*  ALT 21 21 18   ALKPHOS 81 61 77  BILITOT 2.4* 1.2 0.7   Potassium (mmol/L)  Date Value  10/26/2018 3.6  03/13/2014 3.7   Magnesium (mg/dL)  Date Value  16/10/960411/19/2019 2.4   Phosphorus (mg/dL)  Date Value  54/09/811911/19/2019 2.3 (L)   Calcium (mg/dL)  Date Value  14/78/295611/19/2019 8.3 (L)   Calcium, Total (mg/dL)  Date Value  21/30/865704/05/2014 8.3 (L)   Albumin (g/dL)  Date Value  84/69/629511/19/2019 2.5 (L)  03/14/2014 2.3 (L)  ] Estimated Creatinine Clearance: 59.1 mL/min (A) (by C-G formula based on SCr of 0.36 mg/dL (L)).     Assessment: Patient is a 62yo female admitted for respiratory distress. Pharmacy consulted for electrolyte management.  11/19 K=3.6, Phos=2.3, Mag=2.4  Goal of Therapy:  Maintain electrolytes within range  Plan:  Will order Phos-Nak TID x 3 doses. Will recheck electrolytes with AM labs and continue to replace as needed.   Gardner CandleSheema M Jozalynn Noyce, PharmD, BCPS Clinical Pharmacist 10/26/2018 7:20 AM

## 2018-10-26 NOTE — Progress Notes (Signed)
   Name: Kylie HuntsmanSharon D Monroe MRN: [id number] DOB: 09/09/1956     CONSULTATION DATE: 10/24/2018  Objective & objectives: Fentanyl + propofol.  Remains on a ventilator and no major issues last night  PAST MEDICAL HISTORY :   has a past medical history of COPD (chronic obstructive pulmonary disease) (HCC), Hepatitis C, and Hypertension.  has a past surgical history that includes Cholecystectomy and prolapsed rectum. Prior to Admission medications   Medication Sig Start Date End Date Taking? Authorizing Provider  albuterol (PROAIR HFA) 108 (90 Base) MCG/ACT inhaler Inhale 90 mcg into the lungs as needed. 01/25/13   [provider]  aspirin 81 MG chewable tablet Chew 81 mg by mouth daily. 09/19/16   [provider]  bisacodyl (DULCOLAX) 5 MG EC tablet Take 5 mg by mouth daily. 09/25/08   [provider]  Buprenorphine HCl-Naloxone HCl (SUBOXONE) 8-2 MG FILM Take 8.5 Film by mouth 3 (three) times daily. 09/25/16   [provider]  FLOVENT HFA 220 MCG/ACT inhaler Inhale 220 mcg into the lungs 2 (two) times daily. 12/10/17   [provider]  gabapentin (NEURONTIN) 300 MG capsule Take 300 mg by mouth daily. 09/19/16   [provider]   No Known Allergies  FAMILY HISTORY:  family history includes Cancer in her mother. SOCIAL HISTORY:  reports that she has been smoking cigarettes. She has a 44.00 pack-year smoking history. She has never used smokeless tobacco. She reports that she has current or past drug history. Drug: Cocaine. She reports that she does not drink alcohol.  REVIEW OF SYSTEMS:   Unable to obtain due to critical illness   VITAL SIGNS: Temp:  [98.2 F (36.8 C)-100.4 F (38 C)] 100.4 F (38 C) (11/19 0800) Pulse Rate:  [98-119] 110 (11/19 1000) Resp:  [10-22] 10 (11/19 1000) BP: (97-125)/(50-79) 105/61 (11/19 1000) SpO2:  [93 %-98 %] 96 % (11/19 1000) FiO2 (%):  [60 %] 60 % (11/19 0800) Weight:  [58.9 kg] 58.9 kg (11/19  0442)  Physical Examination:  Sedated with propofol and fentanyl to RASS of -3.  Followed command was tapering down sedation On vent, no distress, bilateral equal air entry with no adventitious sounds S1 & S2 are audible with no murmur Benign abdominal exam with feeble peristalsis No leg edema  ASSESSMENT / PLAN:  Acute respiratory failure. -SAT + SBT as tolerated -Monitor ABG, optimize vent settings and continuous ventilator support  Pneumonia.  improved bilateral airspace disease Rt > Lt with Pl. eff. respiratory culture growing GPC.  MRSA PCR negative -Rocephin.  Monitor CXR + CBC + FiO2  COPD -Optimize bronchodilators + inhaled steroids and tapering systemic steroids  UTI. E Coli -Rocephin.  Monitor urine culture  Septic shock with Streptococcus pneumonia bacteremia -Optimize volume, pressors and monitor hemodynamics -Continue with Rocephin.  Sepsis with lactic acidemia (improved). Streptococcus pneumonia bacteremia. Prerenal azotemia -Rocephin. -Optimize hydration, monitor lactic acid, cultures and procalcitonin  Altered mental status with cocaine intoxication.  CT head negative for acute intracranial abnormalities -Initial CSF work-up was negative -Consider DC acyclovir as final CSF culture report  Hypertriglyceridemia due to propofol -Optimize propofol and monitor triglycerides  Anemia -Keep hemoglobin more than 7 g/dL  Thrombocytopenia -D/C Lovenox and monitor platelet count  Hypophosphatemia -Replete and monitor electrolytes  Full code  DVT & GI prophylaxis.  Continue with supportive care.   Mr. Kylie Monroe was updated  Critical care time 45 min

## 2018-10-26 NOTE — Progress Notes (Signed)
[facility] - [place] at [facility]   PATIENT NAME: Kylie Monroe    MR#:  [id number]  DATE OF BIRTH:  1956/04/02  SUBJECTIVE:   patient on vent No family members at bedside  REVIEW OF SYSTEMS:    Unable to obtain patient intubated    Tolerating Diet:NPO      DRUG ALLERGIES:  No Known Allergies  VITALS:  Blood pressure (!) 101/58, pulse (!) 103, temperature 99.7 F (37.6 C), temperature source Bladder, resp. rate 12, height 5\' 2"  (1.575 m), weight 58.9 kg, SpO2 96 %.  PHYSICAL EXAMINATION:  Constitutional: Appears thin intubated awake but not following commands. No distress. HENT: Normocephalic.  ET tube intact Eyes: Conjunctivae  are normal. PERRLA, no scleral icterus.  Neck: Normal ROM. Neck supple. No JVD CVS: RRR, S1/S2 +, no murmurs, no gallops, no carotid bruit.  Pulmonary: Effort and breath sounds normal, no stridor, rhonchi, wheezes, rales.  Abdominal: Soft. BS +,  no distension, tenderness, rebound or guarding.  Musculoskeletal:  No edema and no tenderness.  Neuro: awake on vent. t. No focal deficits. Skin: Skin is warm and dry. No rash noted.     LABORATORY PANEL:   CBC Recent Labs  Lab 10/26/18 1401  WBC 9.3  HGB 9.0*  HCT 29.4*  PLT 92*   ------------------------------------------------------------------------------------------------------------------  Chemistries  Recent Labs  Lab 10/26/18 0540  NA 141  K 3.6  CL 106  CO2 30  GLUCOSE 155*  BUN 27*  CREATININE 0.36*  CALCIUM 8.3*  MG 2.4  AST 48*  ALT 18  ALKPHOS 77  BILITOT 0.7   ------------------------------------------------------------------------------------------------------------------  Cardiac Enzymes Recent Labs  Lab 10/24/18 0512 10/25/18 0618  TROPONINI 0.04* 0.03*   ------------------------------------------------------------------------------------------------------------------  RADIOLOGY:  Dg Chest Port 1 View  Result Date:  10/26/2018 CLINICAL DATA:  Pneumonia. EXAM: PORTABLE CHEST 1 VIEW COMPARISON:  Radiograph of October 25, 2018. FINDINGS: Stable cardiomediastinal silhouette. Endotracheal and nasogastric tubes are unchanged in position. No pneumothorax is noted. Stable right lung opacity is noted concerning for pneumonia with probable associated pleural effusion. Mild left basilar atelectasis or infiltrate is noted. Bony thorax is unremarkable. IMPRESSION: Stable support apparatus. Stable right lung opacity is noted concerning for pneumonia. Stable left basilar atelectasis or infiltrate is noted. Electronically Signed   By: Lupita Raider, M.D.   On: 10/26/2018 07:25   Dg Chest Port 1 View  Result Date: 10/25/2018 CLINICAL DATA:  62 year old female with acute respiratory failure. Subsequent encounter. EXAM: PORTABLE CHEST 1 VIEW COMPARISON:  10/24/2018 chest x-ray. FINDINGS: Endotracheal tube tip 2.1 cm above the carina. Nasogastric tube side hole gastric fundus-body level. Tip not imaged on the present exam. Almost complete consolidation right thorax with sparing right apical region. Progressive pulmonary vascular congestion left lung. Cardiomegaly.  Calcified mildly tortuous aorta. IMPRESSION: 1. Persistent consolidation majority of right thorax with sparing right lung apex. This may represent infectious infiltrate possibly with associated pleural effusion. 2. Progressive pulmonary vascular congestion left lung. 3. Endotracheal tube tip 2.1 cm above the carina. 4.  Aortic Atherosclerosis (ICD10-I70.0). Electronically Signed   By: Lacy Duverney M.D.   On: 10/25/2018 08:03     ASSESSMENT AND PLAN:   62 year old female with history of COPD presented with shortness of breath.  1.  Acute hypoxic respiratory failure requiring intubation due to pneumonia and COPD exacerbation: Vent management by intensivist.  SBAT trials Continue steroids.  Fentanyl and propofol for sedation  2.  Streptococcus pneumonia bacteremia  with septic shock: Patient only  on Rocephin. Would recommend adding azithromycin Keep map greater than 65 Blood culture Streptococcus pneumonia, sensitivity pending Endotracheal aspirate was done on 11/17  3.  Gram-negative UTI: Follow-up on urine culture Continue Rocephin  4.  Acute encephalopathy with cocaine intoxication: Patient is status post lumbar puncture in the emergency room Initial CSF work-up was negative.  CSF cultures are negative discontinue ganciclovir  5.  Electrolytes: Replete as needed  6.  Thrombocytopenia from septic shock: Monitor closely.  Lovenox DC'd SCDs  7.  Urine toxicology positive for cocaine Management plans discussed with the patient's husband and he is CODE STATUS: Full  TOTAL TIME TAKING CARE OF THIS PATIENT: 33 minutes.     POSSIBLE D/C 2 to 4 days, DEPENDING ON CLINICAL CONDITION.   Kylie Monroe M.D on 10/26/2018 at 3:34 PM  Between 7am to 6pm - Pager - [phone] After 6pm go to [email or web address] - Social research officer, governmentpassword EPAS [facility]  [facility]  Office  [phone]  CC: Primary care physician; Donaciano EvaWoodruff, Sarah, FNP  Note: This dictation was prepared with Dragon dictation along with smaller phrase technology. Any transcriptional errors that result from this process are unintentional.

## 2018-10-27 ENCOUNTER — Inpatient Hospital Stay: Payer: Medicaid Other

## 2018-10-27 DIAGNOSIS — Z9911 Dependence on respirator [ventilator] status: Secondary | ICD-10-CM

## 2018-10-27 DIAGNOSIS — R825 Elevated urine levels of drugs, medicaments and biological substances: Secondary | ICD-10-CM

## 2018-10-27 DIAGNOSIS — R7881 Bacteremia: Secondary | ICD-10-CM

## 2018-10-27 DIAGNOSIS — F17210 Nicotine dependence, cigarettes, uncomplicated: Secondary | ICD-10-CM

## 2018-10-27 DIAGNOSIS — B953 Streptococcus pneumoniae as the cause of diseases classified elsewhere: Secondary | ICD-10-CM

## 2018-10-27 DIAGNOSIS — Z978 Presence of other specified devices: Secondary | ICD-10-CM

## 2018-10-27 DIAGNOSIS — J449 Chronic obstructive pulmonary disease, unspecified: Secondary | ICD-10-CM

## 2018-10-27 DIAGNOSIS — J13 Pneumonia due to Streptococcus pneumoniae: Secondary | ICD-10-CM

## 2018-10-27 DIAGNOSIS — R509 Fever, unspecified: Secondary | ICD-10-CM

## 2018-10-27 DIAGNOSIS — R32 Unspecified urinary incontinence: Secondary | ICD-10-CM

## 2018-10-27 DIAGNOSIS — I10 Essential (primary) hypertension: Secondary | ICD-10-CM

## 2018-10-27 DIAGNOSIS — Z8619 Personal history of other infectious and parasitic diseases: Secondary | ICD-10-CM

## 2018-10-27 LAB — BLOOD GAS, ARTERIAL
Acid-Base Excess: 8.2 mmol/L — ABNORMAL HIGH (ref 0.0–2.0)
BICARBONATE: 35 mmol/L — AB (ref 20.0–28.0)
FIO2: 0.6
MECHVT: 450 mL
O2 Saturation: 93.4 %
PCO2 ART: 62 mmHg — AB (ref 32.0–48.0)
PEEP: 5 cmH2O
PH ART: 7.36 (ref 7.350–7.450)
PO2 ART: 71 mmHg — AB (ref 83.0–108.0)
Patient temperature: 37
RATE: 16 resp/min

## 2018-10-27 LAB — GLUCOSE, CAPILLARY
Glucose-Capillary: 102 mg/dL — ABNORMAL HIGH (ref 70–99)
Glucose-Capillary: 113 mg/dL — ABNORMAL HIGH (ref 70–99)
Glucose-Capillary: 118 mg/dL — ABNORMAL HIGH (ref 70–99)
Glucose-Capillary: 126 mg/dL — ABNORMAL HIGH (ref 70–99)
Glucose-Capillary: 148 mg/dL — ABNORMAL HIGH (ref 70–99)
Glucose-Capillary: 161 mg/dL — ABNORMAL HIGH (ref 70–99)

## 2018-10-27 LAB — CSF CULTURE
CULTURE: NO GROWTH
GRAM STAIN: NONE SEEN

## 2018-10-27 LAB — COMPREHENSIVE METABOLIC PANEL
ALBUMIN: 2.2 g/dL — AB (ref 3.5–5.0)
ALK PHOS: 83 U/L (ref 38–126)
ALT: 19 U/L (ref 0–44)
AST: 44 U/L — AB (ref 15–41)
Anion gap: 3 — ABNORMAL LOW (ref 5–15)
BUN: 37 mg/dL — AB (ref 6–20)
CALCIUM: 7.9 mg/dL — AB (ref 8.9–10.3)
CO2: 33 mmol/L — AB (ref 22–32)
CREATININE: 0.44 mg/dL (ref 0.44–1.00)
Chloride: 108 mmol/L (ref 98–111)
GFR calc Af Amer: >60 mL/min (ref >60)
GFR calc non Af Amer: >60 mL/min (ref >60)
GLUCOSE: 156 mg/dL — AB (ref 70–99)
Potassium: 5.1 mmol/L (ref 3.5–5.1)
SODIUM: 144 mmol/L (ref 135–145)
Total Bilirubin: 0.7 mg/dL (ref 0.3–1.2)
Total Protein: 5.8 g/dL — ABNORMAL LOW (ref 6.5–8.1)

## 2018-10-27 LAB — CULTURE, BLOOD (ROUTINE X 2)

## 2018-10-27 LAB — CULTURE, RESPIRATORY

## 2018-10-27 LAB — PROCALCITONIN: Procalcitonin: 1.99 ng/mL

## 2018-10-27 LAB — CULTURE, RESPIRATORY W GRAM STAIN

## 2018-10-27 LAB — CSF CULTURE W GRAM STAIN

## 2018-10-27 LAB — MAGNESIUM: Magnesium: 2.4 mg/dL (ref 1.7–2.4)

## 2018-10-27 LAB — TRIGLYCERIDES
TRIGLYCERIDES: 144 mg/dL (ref <150)
Triglycerides: 144 mg/dL (ref <150)

## 2018-10-27 LAB — CALCIUM, IONIZED: Calcium, Ionized, Serum: 5 mg/dL (ref 4.5–5.6)

## 2018-10-27 LAB — PHOSPHORUS: Phosphorus: 2.8 mg/dL (ref 2.5–4.6)

## 2018-10-27 MED ORDER — FREE WATER: 100.0000 mL | Freq: Four times a day (QID) | Status: DC

## 2018-10-27 MED ORDER — SENNOSIDES 8.8 MG/5ML PO SYRP
5.0000 mL | ORAL_SOLUTION | Freq: Two times a day (BID) | ORAL | Status: DC
  Administered 2018-10-27 – 2018-11-07 (×19): 5 mL
  Filled 2018-10-27 (×25): qty 5

## 2018-10-27 MED ORDER — VANCOMYCIN HCL IN DEXTROSE 750-5 MG/150ML-% IV SOLN
750.0000 mg | Freq: Two times a day (BID) | INTRAVENOUS | Status: DC
  Administered 2018-10-27 – 2018-10-28 (×3): 750 mg via INTRAVENOUS
  Filled 2018-10-27 (×6): qty 150

## 2018-10-27 MED ORDER — FREE WATER
100.0000 mL | Freq: Four times a day (QID) | Status: DC
  Administered 2018-10-27 – 2018-11-02 (×24): 100 mL

## 2018-10-27 MED ORDER — PIPERACILLIN-TAZOBACTAM 3.375 G IVPB
3.3750 g | Freq: Three times a day (TID) | INTRAVENOUS | Status: DC
  Administered 2018-10-27 – 2018-10-31 (×13): 3.375 g via INTRAVENOUS
  Filled 2018-10-27 (×13): qty 50

## 2018-10-27 MED ORDER — ALBUMIN HUMAN 25 % IV SOLN
25.0000 g | Freq: Once | INTRAVENOUS | Status: AC
  Administered 2018-10-27: 25 g via INTRAVENOUS
  Filled 2018-10-27 (×2): qty 100

## 2018-10-27 MED ORDER — LORAZEPAM 2 MG/ML IJ SOLN
1.0000 mg | INTRAMUSCULAR | Status: DC | PRN
  Administered 2018-10-27: 1 mg via INTRAVENOUS
  Administered 2018-10-28 – 2018-11-01 (×5): 2 mg via INTRAVENOUS
  Filled 2018-10-27 (×6): qty 1

## 2018-10-27 MED ORDER — VANCOMYCIN HCL IN DEXTROSE 1-5 GM/200ML-% IV SOLN
1000.0000 mg | Freq: Once | INTRAVENOUS | Status: AC
  Administered 2018-10-27: 1000 mg via INTRAVENOUS
  Filled 2018-10-27: qty 200

## 2018-10-27 MED ORDER — GABAPENTIN 300 MG PO CAPS
300.0000 mg | ORAL_CAPSULE | Freq: Every day | ORAL | Status: DC
  Administered 2018-10-27 – 2018-11-07 (×12): 300 mg via ORAL
  Filled 2018-10-27 (×12): qty 1

## 2018-10-27 MED ORDER — FUROSEMIDE 10 MG/ML IJ SOLN
20.0000 mg | Freq: Three times a day (TID) | INTRAMUSCULAR | Status: AC
  Administered 2018-10-27 – 2018-10-28 (×3): 20 mg via INTRAVENOUS
  Filled 2018-10-27 (×3): qty 2

## 2018-10-27 NOTE — Consult Note (Signed)
NAME: Kylie Monroe  DOB: 04-05-1956  MRN: [id number]  Date/Time: 10/27/2018 3:45 PM  [place] Subjective:  REASON FOR CONSULT: fever, pneumococcus bacteremia No history available from patient. Chart reviewed? Kylie Monroe is a 62 y.o. female with a history of HEPC, COPD, HTN was admitted from home on 11/17 with headache, confusion and incontinence. As per EMS note the patient's boyfriend had mentioned that she was confused for a few days .and In the ED she was noted to have fever. 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 CT head was unremarkable she had a LP which was normal. She had resp distress with pulse ox of 80% and was intubated in the ED. Urine tox screen positive for Cocaine. CXR showed RLL infiltrate. Pt was admitted to ICU. Blood culture came back as pneumococcus. She was started on vanco and ceftriaxone and then deescalated to ceftriaxone. She spiked another fever today and antibiotics were broadened to vanco and zosyn. I am consulted for the same. Past Medical History:  Diagnosis Date  . COPD (chronic obstructive pulmonary disease) ([place])   . Hepatitis C   . Hypertension    treated HEpc with Jacklynn Barnacle Past Surgical History:  Procedure Laterality Date  . CHOLECYSTECTOMY    . prolapsed rectum     fracture left femur-with surgery in 2017 Social History   Socioeconomic History  . Marital status: Divorced    Spouse name: Not on file  . Number of children: Not on file  . Years of education: Not on file  . Highest education level: Not on file  Occupational History  . Not on file  Social Needs  . Financial resource strain: Not on file  . Food insecurity:    Worry: Not on file    Inability: Not on file  . Transportation needs:    Medical: Not on file    Non-medical: Not on file  Tobacco Use  . Smoking status: Current Every Day Smoker    Packs/day: 1.00    Years: 44.00    Pack years: 44.00    Types: Cigarettes  . Smokeless  tobacco: Never Used  Substance and Sexual Activity  . Alcohol use: No  . Drug use: Yes    Types: Cocaine    Comment: valium  . Sexual activity: Not on file  Lifestyle  . Physical activity:    Days per week: Not on file    Minutes per session: Not on file  . Stress: Not on file  Relationships  . Social connections:    Talks on phone: Not on file    Gets together: Not on file    Attends religious service: Not on file    Active member of club or organization: Not on file    Attends meetings of clubs or organizations: Not on file    Relationship status: Not on file  . Intimate partner violence:    Fear of current or ex partner: Not on file    Emotionally abused: Not on file    Physically abused: Not on file    Forced sexual activity: Not on file  Other Topics Concern  . Not on file  Social History Narrative  . Not on file    Family History  Problem Relation Age of Onset  . Cancer Mother    No Known Allergies ? Current Facility-Administered Medications  Medication Dose Route Frequency Provider Last Rate Last Dose  . 0.9 %  sodium chloride infusion   Intravenous Continuous Cassandria Santee, MD   Stopped at 10/27/18 1112  . acetaminophen (TYLENOL) tablet 650 mg  650 mg Oral Q6H PRN Harrie Foreman, MD   650 mg at 10/26/18 2203   Or  . acetaminophen (TYLENOL) suppository 650 mg  650 mg Rectal Q6H PRN Harrie Foreman, MD      . albuterol (PROVENTIL) (2.5 MG/3ML) 0.083% nebulizer solution 2.5 mg  2.5 mg Nebulization Q4H PRN Harrie Foreman, MD      . budesonide (PULMICORT) nebulizer solution 0.5 mg  0.5 mg Nebulization BID Flora Lipps, MD   0.5 mg at 10/27/18 0717  . chlorhexidine gluconate (MEDLINE KIT) (PERIDEX) 0.12 % solution 15 mL  15 mL Mouth Rinse BID Flora Lipps, MD   15 mL at 10/27/18 0812  . docusate (COLACE) 50 MG/5ML liquid 100 mg  100 mg Oral Daily Kasa, Kurian, MD   100 mg at 10/27/18 1111  . famotidine (PEPCID) tablet 20 mg  20 mg Per Tube Daily Hallaji,  Sheema M, RPH   20 mg at 10/27/18 1112  . feeding supplement (VITAL AF 1.2 CAL) liquid 1,000 mL  1,000 mL Per Tube Continuous Soyla Murphy, Maged, MD 50 mL/hr at 10/26/18 1630 1,000 mL at 10/26/18 1630  . fentaNYL 2530mg in NS 2574m(1033mml) infusion-PREMIX  0-400 mcg/hr Intravenous Continuous SamSoyla Murphyaged, MD 20 mL/hr at 10/27/18 1501 200 mcg/hr at 10/27/18 1501  . free water 100 mL  100 mL Per Tube Q6H Samaan, Maged, MD   100 mL at 10/27/18 1300  . furosemide (LASIX) injection 20 mg  20 mg Intravenous Q8H Samaan, Maged, MD   20 mg at 10/27/18 1320  . gabapentin (NEURONTIN) capsule 300 mg  300 mg Oral Daily Samaan, Maged, MD   300 mg at 10/27/18 1319  . ipratropium-albuterol (DUONEB) 0.5-2.5 (3) MG/3ML nebulizer solution 3 mL  3 mL Nebulization Q4H KasFlora LippsD   3 mL at 10/27/18 1511  . LORazepam (ATIVAN) injection 1-2 mg  1-2 mg Intravenous Q4H PRN OgaFrederik PearD   1 mg at 10/27/18 0226  . MEDLINE mouth rinse  15 mL Mouth Rinse 10 times per day KasFlora LippsD   15 mL at 10/27/18 1328  . multivitamin liquid 15 mL  15 mL Per Tube Daily SamSoyla Murphyaged, MD   15 mL at 10/27/18 1111  . norepinephrine (LEVOPHED) 4mg37m D5W 250mL77mmix infusion  0-40 mcg/min Intravenous Continuous DiamoHarrie Foreman  Stopped at 10/24/18 1342  . ondansetron (ZOFRAN) tablet 4 mg  4 mg Oral Q6H PRN DiamoHarrie Foreman      Or  . ondansetron (ZOFRSan[facility] Ctrection 4 mg  4 mg Intravenous Q6H PRN DiamoHarrie Foreman     . piperacillin-tazobactam (ZOSYN) IVPB 3.375 g  3.375 g Intravenous Q8H Slaughter, Myra G, RPH 12.5 mL/hr at 10/27/18 1322 3.375 g at 10/27/18 1322  . propofol (DIPRIVAN) 1000 MG/100ML infusion  5-80 mcg/kg/min Intravenous Titrated SamaaCassandria Santee6.79 mL/hr at 10/27/18 1422 20 mcg/kg/min at 10/27/18 1422  . sennosides (SENOKOT) 8.8 MG/5ML syrup 5 mL  5 mL Per Tube BID SamaaSoyla Murphyed, MD   5 mL at 10/27/18 1319  . vancomycin (VANCOCIN) IVPB 750 mg/150 ml premix  750 mg Intravenous Q12H  Slaughter, Myra Precious Reel      . vasopressin (PITRESSIN) 40 Units in sodium chloride 0.9 % 250 mL (0.16 Units/mL) infusion  0.03 Units/min Intravenous Continuous Rifenbark, Neil,Milta Deiters  MD   Stopped at 10/24/18 0727     Abtx:  Anti-infectives (From admission, onward)   Start     Dose/Rate Route Frequency Ordered Stop   10/27/18 1800  vancomycin (VANCOCIN) IVPB 750 mg/150 ml premix     750 mg 150 mL/hr over 60 Minutes Intravenous Every 12 hours 10/27/18 1154     10/27/18 1030  vancomycin (VANCOCIN) IVPB 1000 mg/200 mL premix     1,000 mg 200 mL/hr over 60 Minutes Intravenous  Once 10/27/18 1028 10/27/18 1212   10/27/18 1030  piperacillin-tazobactam (ZOSYN) IVPB 3.375 g     3.375 g 12.5 mL/hr over 240 Minutes Intravenous Every 8 hours 10/27/18 1028     10/26/18 1000  cefTRIAXone (ROCEPHIN) 2 g in sodium chloride 0.9 % 100 mL IVPB  Status:  Discontinued     2 g 200 mL/hr over 30 Minutes Intravenous Every 24 hours 10/25/18 1158 10/27/18 1021   10/24/18 2200  cefTRIAXone (ROCEPHIN) 2 g in sodium chloride 0.9 % 100 mL IVPB  Status:  Discontinued     2 g 200 mL/hr over 30 Minutes Intravenous Every 12 hours 10/24/18 2145 10/25/18 1158   10/24/18 0800  ceFEPIme (MAXIPIME) 2 g in sodium chloride 0.9 % 100 mL IVPB  Status:  Discontinued     2 g 200 mL/hr over 30 Minutes Intravenous Every 12 hours 10/24/18 0646 10/24/18 2145   10/24/18 0630  metroNIDAZOLE (FLAGYL) IVPB 500 mg     500 mg 100 mL/hr over 60 Minutes Intravenous  Once 10/24/18 0623 10/24/18 0815   10/24/18 0530  ganciclovir (CYTOVENE) 280 mg in sodium chloride 0.9 % 100 mL IVPB     5 mg/kg  55.9 kg 100 mL/hr over 60 Minutes Intravenous Every 12 hours 10/24/18 0454 10/25/18 2000   10/24/18 0500  cefTRIAXone (ROCEPHIN) 2 g in sodium chloride 0.9 % 100 mL IVPB     2 g 200 mL/hr over 30 Minutes Intravenous  Once 10/24/18 0453 10/24/18 0600      REVIEW OF SYSTEMS:  NA Objective:  VITALS:  BP 129/79   Pulse (!) 124   Temp (!) 100.6 F  (38.1 C) (Bladder)   Resp 20   Ht 5' 2"  (1.575 m)   Wt 62.8 kg   SpO2 95%   BMI 25.32 kg/m  PHYSICAL EXAM:  General: intubated and sedated Head: Normocephalic, without obvious abnormality, atraumatic. Eyes: Conjunctivae clear, anicteric sclerae. Pupils are equal ENT did not examined ET tube and NG tube Neck: did not examine Back: Did not examine Lungs: b/l air entry decreased rt side , crepts present Heart: Regular rate and rhythm, no murmur, rub or gallop. Abdomen: Soft, non-tender,not distended. Bowel sounds normal. No masses Extremities: atraumatic, no cyanosis. No edema. No clubbing Skin: No rashes or lesions. Or bruising Lymph: Cervical, supraclavicular normal. Neurologic: cannot be examined as sedated Pertinent Labs Lab Results CBC    Component Value Date/Time   WBC 9.3 10/26/2018 1401   RBC 3.17 (L) 10/26/2018 1401   HGB 9.0 (L) 10/26/2018 1401   HGB 13.7 03/13/2014 1603   HCT 29.4 (L) 10/26/2018 1401   HCT 41.7 03/13/2014 1603   PLT 92 (L) 10/26/2018 1401   PLT 100 (L) 03/13/2014 1603   MCV 92.7 10/26/2018 1401   MCV 102 (H) 03/13/2014 1603   MCH 28.4 10/26/2018 1401   MCHC 30.6 10/26/2018 1401   RDW 20.1 (H) 10/26/2018 1401   RDW 15.1 (H) 03/13/2014 1603   LYMPHSABS 0.4 (L) 10/26/2018 1401  MONOABS 0.2 10/26/2018 1401   EOSABS 0.0 10/26/2018 1401   BASOSABS 0.1 10/26/2018 1401    CMP Latest Ref Rng & Units 10/27/2018 10/26/2018 10/25/2018  Glucose 70 - 99 mg/dL 156(H) 155(H) 136(H)  BUN 6 - 20 mg/dL 37(H) 27(H) 21(H)  Creatinine 0.44 - 1.00 mg/dL 0.44 0.36(L) 0.48  Sodium 135 - 145 mmol/L 144 141 137  Potassium 3.5 - 5.1 mmol/L 5.1 3.6 3.4(L)  Chloride 98 - 111 mmol/L 108 106 103  CO2 22 - 32 mmol/L 33(H) 30 29  Calcium 8.9 - 10.3 mg/dL 7.9(L) 8.3(L) 8.0(L)  Total Protein 6.5 - 8.1 g/dL 5.8(L) 5.9(L) 5.5(L)  Total Bilirubin 0.3 - 1.2 mg/dL 0.7 0.7 1.2  Alkaline Phos 38 - 126 U/L 83 77 61  AST 15 - 41 U/L 44(H) 48(H) 62(H)  ALT 0 - 44 U/L 19 18 21        Microbiology: Recent Results (from the past 240 hour(s))  Urine culture     Status: Abnormal   Collection Time: 10/24/18  4:56 AM  Result Value Ref Range Status   Specimen Description   Final    URINE, RANDOM Performed at [facility], [address] [place], [place] [address]    Special Requests   Final    NONE Performed at [facility], [address] [place], [place] [address]    Culture >=100,000 COLONIES/mL ESCHERICHIA COLI (A)  Final   Report Status 10/26/2018 FINAL  Final   Organism ID, Bacteria ESCHERICHIA COLI (A)  Final      Susceptibility   Escherichia coli - MIC*    AMPICILLIN >=32 RESISTANT Resistant     CEFAZOLIN 8 SENSITIVE Sensitive     CEFTRIAXONE <=1 SENSITIVE Sensitive     CIPROFLOXACIN >=4 RESISTANT Resistant     GENTAMICIN <=1 SENSITIVE Sensitive     IMIPENEM 0.5 SENSITIVE Sensitive     NITROFURANTOIN <=16 SENSITIVE Sensitive     TRIMETH/SULFA <=20 SENSITIVE Sensitive     AMPICILLIN/SULBACTAM >=32 RESISTANT Resistant     PIP/TAZO <=4 SENSITIVE Sensitive     Extended ESBL NEGATIVE Sensitive     * >=100,000 COLONIES/mL ESCHERICHIA COLI  Culture, blood (routine x 2)     Status: Abnormal   Collection Time: 10/24/18  5:12 AM  Result Value Ref Range Status   Specimen Description   Final    BLOOD RIGHT ANTECUBITAL Performed at [facility], [address] [place], [place] [address]    Special Requests   Final    BOTTLES DRAWN AEROBIC AND ANAEROBIC Blood Culture results may not be optimal due to an excessive volume of blood received in culture bottles Performed at [facility], [address] [place], [place] [address]    Culture  Setup Time   Final    GRAM POSITIVE COCCI AEROBIC BOTTLE ONLY CRITICAL RESULT CALLED TO, READ BACK BY AND VERIFIED WITH: Winfield Rast PATEL @2118  10/24/18 AKT Performed at [facility] [address] [place], [place] [address]    Culture STREPTOCOCCUS PNEUMONIAE (A)  Final    Report Status 10/27/2018 FINAL  Final   Organism ID, Bacteria STREPTOCOCCUS PNEUMONIAE  Final      Susceptibility   Streptococcus pneumoniae - MIC*    ERYTHROMYCIN <=0.12 SENSITIVE Sensitive     LEVOFLOXACIN 0.5 SENSITIVE Sensitive     VANCOMYCIN 0.5 SENSITIVE Sensitive     PENICILLIN (non-meningitis) <=0.06 SENSITIVE Sensitive     CEFTRIAXONE (non-meningitis) <=0.12 SENSITIVE Sensitive     * STREPTOCOCCUS  PNEUMONIAE  Blood Culture ID Panel (Reflexed)     Status: Abnormal   Collection Time: 10/24/18  5:12 AM  Result Value Ref Range Status   Enterococcus species NOT DETECTED NOT DETECTED Final   Listeria monocytogenes NOT DETECTED NOT DETECTED Final   Staphylococcus species NOT DETECTED NOT DETECTED Final   Staphylococcus aureus (BCID) NOT DETECTED NOT DETECTED Final   Streptococcus species DETECTED (A) NOT DETECTED Final    Comment: CRITICAL RESULT CALLED TO, READ BACK BY AND VERIFIED WITH: KISHAN PATEL @2118  10/24/18 AKT    Streptococcus agalactiae NOT DETECTED NOT DETECTED Final   Streptococcus pneumoniae DETECTED (A) NOT DETECTED Final    Comment: CRITICAL RESULT CALLED TO, READ BACK BY AND VERIFIED WITH: Winfield Rast PATEL @2118  10/24/18 AKT    Streptococcus pyogenes NOT DETECTED NOT DETECTED Final   Acinetobacter baumannii NOT DETECTED NOT DETECTED Final   Enterobacteriaceae species NOT DETECTED NOT DETECTED Final   Enterobacter cloacae complex NOT DETECTED NOT DETECTED Final   Escherichia coli NOT DETECTED NOT DETECTED Final   Klebsiella oxytoca NOT DETECTED NOT DETECTED Final   Klebsiella pneumoniae NOT DETECTED NOT DETECTED Final   Proteus species NOT DETECTED NOT DETECTED Final   Serratia marcescens NOT DETECTED NOT DETECTED Final   Haemophilus influenzae NOT DETECTED NOT DETECTED Final   Neisseria meningitidis NOT DETECTED NOT DETECTED Final   Pseudomonas aeruginosa NOT DETECTED NOT DETECTED Final   Candida albicans NOT DETECTED NOT DETECTED Final   Candida glabrata NOT  DETECTED NOT DETECTED Final   Candida krusei NOT DETECTED NOT DETECTED Final   Candida parapsilosis NOT DETECTED NOT DETECTED Final   Candida tropicalis NOT DETECTED NOT DETECTED Final    Comment: Performed at [facility], [address] [place], [place] [address]  CSF culture     Status: None   Collection Time: 10/24/18  5:54 AM  Result Value Ref Range Status   Specimen Description   Final    CSF Performed at [facility], [address], [place], [place] [address]    Special Requests   Final    NONE Performed at [facility], [place], [place] [address]    Gram Stain   Final    NO ORGANISMS SEEN RED BLOOD CELLS PRESENT WBC SEEN CYTOSPIN SMEAR    Culture   Final    NO GROWTH 3 DAYS Performed at [facility], [place] [address], [place], [place] [address]    Report Status 10/27/2018 FINAL  Final  MRSA PCR Screening     Status: None   Collection Time: 10/24/18  8:03 AM  Result Value Ref Range Status   MRSA by PCR NEGATIVE NEGATIVE Final    Comment:        The GeneXpert MRSA Assay (FDA approved for NASAL specimens only), is one component of a comprehensive MRSA colonization surveillance program. It is not intended to diagnose MRSA infection nor to guide or monitor treatment for MRSA infections. Performed at [facility], [address] [place], [place] [address]   Expectorated sputum assessment w rflx to resp cult     Status: None   Collection Time: 10/24/18  8:39 AM  Result Value Ref Range Status   Specimen Description SPUTUM  Final   Special Requests NONE  Final   Sputum evaluation   Final    THIS SPECIMEN IS ACCEPTABLE FOR SPUTUM CULTURE ENDOTRACHEAL SPUEVA CREDITED WITH RETAINED RESULTS. [facility] 10/24/18 AT 4403 Performed at [facility], [place]  [address], [place], [place] [address]    Report Status 10/24/2018 FINAL  Final  Culture, respiratory     Status: None   Collection Time: 10/24/18  8:39  AM  Result Value Ref Range Status   Specimen Description   Final    SPUTUM Performed at [facility], [address] [place], [place] [address]    Special Requests   Final    NONE Reflexed from [phone] Performed at [facility], [address] [place], [place] [address]    Gram Stain   Final    FEW WBC PRESENT, PREDOMINANTLY PMN FEW GRAM POSITIVE COCCI Performed at [facility], [place] [address], [place], [place] [address]    Culture   Final    RARE STREPTOCOCCUS PNEUMONIAE RARE STAPHYLOCOCCUS AUREUS    Report Status 10/27/2018 FINAL  Final   Organism ID, Bacteria STAPHYLOCOCCUS AUREUS  Final   Organism ID, Bacteria STREPTOCOCCUS PNEUMONIAE  Final      Susceptibility   Staphylococcus aureus - MIC*    CIPROFLOXACIN <=0.5 SENSITIVE Sensitive     ERYTHROMYCIN <=0.25 SENSITIVE Sensitive     GENTAMICIN <=0.5 SENSITIVE Sensitive     OXACILLIN 0.5 SENSITIVE Sensitive     TETRACYCLINE <=1 SENSITIVE Sensitive     VANCOMYCIN <=0.5 SENSITIVE Sensitive     TRIMETH/SULFA <=10 SENSITIVE Sensitive     CLINDAMYCIN <=0.25 SENSITIVE Sensitive     RIFAMPIN <=0.5 SENSITIVE Sensitive     Inducible Clindamycin NEGATIVE Sensitive     * RARE STAPHYLOCOCCUS AUREUS   Streptococcus pneumoniae - MIC*    ERYTHROMYCIN <=0.12 SENSITIVE Sensitive     LEVOFLOXACIN 0.5 SENSITIVE Sensitive     VANCOMYCIN 0.5 SENSITIVE Sensitive     PENICILLIN (meningitis) <=0.06 SENSITIVE Sensitive     PENICILLIN (non-meningitis) <=0.06 SENSITIVE Sensitive     CEFTRIAXONE (non-meningitis) <=0.12 SENSITIVE Sensitive     CEFTRIAXONE (meningitis) <=0.12 SENSITIVE Sensitive     * RARE STREPTOCOCCUS PNEUMONIAE   IMAGING RESULTS: CXR reviewed rt lower lobe pneumonia ? Impression/Recommendation 62 y.o. female with a history of HEPC, COPD, HTN was admitted from home on 11/17 with headache, confusion and incontinence. As per EMS note the patient's boyfriend had mentioned that she was confused for a few  days .and In the ED she was noted to have fever. 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 CT head was unremarkable she had a LP which was normal. She had resp distress with pulse ox of 80% and was intubated in the ED. Urine tox screen positive for Cocaine. CXR showed RLL infiltrate. Pt was admitted to ICU. Blood culture came back as pneumococcus. She was started on vanco and ceftriaxone and then deescalated to ceftriaxone. She spiked another fever today and antibiotics were broadened to vanco and zosyn. I am consulted for the same.  ?Strep pneumoniae bacteremia secondary to rt sided pneumonia  Rt lung Pneumonia due to pneumococcus /rare staph in the sputum- the antibiotics given for pneumococcus should cover staph as well ? Acute hypoxic resp failure- due to pneumonia and underlying COPD -- intubated on 10/24/18 in the ED  New fever and increasing Fio2 - r/o mucous plug, fluid overload, empyema May need bronch /CT chest. Currently the antibiotic has broadened to vanco and zosyn. Was on ceftriaxone before  recommend echo to look for vegetations  Polysubstance use- + cocaine  HEPC treated before   COPD  Discussed the management with her nurse and  intensivist

## 2018-10-27 NOTE — Progress Notes (Addendum)
Name: Kylie Monroe MRN: [id number] DOB: February 09, 1956     CONSULTATION DATE: 10/24/2018  Subjective & objectives: Febrile Tmax 101.7. Propofol + Fentanyl  PAST MEDICAL HISTORY :   has a past medical history of COPD (chronic obstructive pulmonary disease) ([facility]), Hepatitis C, and Hypertension.  has a past surgical history that includes Cholecystectomy and prolapsed rectum. Prior to Admission medications   Medication Sig Start Date End Date Taking? Authorizing Provider  albuterol (PROAIR HFA) 108 (90 Base) MCG/ACT inhaler Inhale 90 mcg into the lungs as needed. 01/25/13   [provider]  aspirin 81 MG chewable tablet Chew 81 mg by mouth daily. 09/19/16   [provider]  bisacodyl (DULCOLAX) 5 MG EC tablet Take 5 mg by mouth daily. 09/25/08   [provider]  Buprenorphine HCl-Naloxone HCl (SUBOXONE) 8-2 MG FILM Take 8.5 Film by mouth 3 (three) times daily. 09/25/16   [provider]  FLOVENT HFA 220 MCG/ACT inhaler Inhale 220 mcg into the lungs 2 (two) times daily. 12/10/17   [provider]  gabapentin (NEURONTIN) 300 MG capsule Take 300 mg by mouth daily. 09/19/16   [provider]   No Known Allergies  FAMILY HISTORY:  family history includes Cancer in her mother. SOCIAL HISTORY:  reports that she has been smoking cigarettes. She has a 44.00 pack-year smoking history. She has never used smokeless tobacco. She reports that she has current or past drug history. Drug: Cocaine. She reports that she does not drink alcohol.  REVIEW OF SYSTEMS:   Unable to obtain due to critical illness   VITAL SIGNS: Temp:  [98.4 F (36.9 C)-101.7 F (38.7 C)] 98.8 F (37.1 C) (11/20 0800) Pulse Rate:  [88-118] 98 (11/20 0800) Resp:  [12-24] 20 (11/20 0800) BP: (93-122)/(54-79) 96/59 (11/20 0800) SpO2:  [94 %-97 %] 96 % (11/20 0800) FiO2 (%):  [60 %] 60 % (11/20 0735) Weight:  [62.8 kg] 62.8 kg (11/20 0500)  Physical Examination: Sedated  with propofol and fentanylto RASS of -3.  Followed command was tapering down sedation On vent, no distress, bilateral equal air entry with no adventitious sounds. Large froth secretions S1 & S2 are audible with no murmur Benign abdominal exam with feeble peristalsis No leg edema  ASSESSMENT / PLAN:  Acute respiratory failure. P/F 118 on 60% PEEP of 5 -SAT + SBT as tolerated -Monitor ABG, optimize vent settings and continuous ventilator support  Pneumonia. worsening bilateral airspace disease Rt > Lt with Pl. Eff. And pulmonary congestion.respiratory culture growing GPC.MRSA PCR negative Febrile, continues to have high FIO2 requirment and concern for VAP -Start on Vanc + Zosyn, d/c Rocephin. Monitor CXR + CBC + FiO2 -Diuresis to improve lung compliance -Follow with repeat cultures and consider bronch for deep respiratory cultures  COPD -Optimize bronchodilators + inhaled steroids and tapering systemic steroids  UTI. E Coli -ABX as per above. Monitor urine culture  Septic shock with Streptococcus pneumonia bacteremia (improved) off pressers. -Monitor hemodynamics   Sepsis with lactic acidemia (febrile).Streptococcus pneumonia bacteremia. -Vanc + Zosyn -Optimize hydration, monitor cultures and procalcitonin -ECHO and ID consult  Altered mental status with cocaine intoxication.CT head negative for acute intracranial abnormalities -Initial CSF work-up was negative -Consider DC acyclovir as final CSF culture report  Hypertriglyceridemia due to propofol -Optimize propofol and monitor triglycerides  Anemia -Keep hemoglobin more than 7 g/dL  Thrombocytopenia -D/C Lovenox and monitor platelet count  Full code  DVT &GI prophylaxis. Continue with supportive care.  Mr.Broy and daughters were updated at  the bedside  Critical care time 45 min

## 2018-10-27 NOTE — Progress Notes (Signed)
eLink Physician-Brief Progress Note Patient Name: Kylie HuntsmanSharon D Monroe DOB: 05/26/1956 MRN: [id number]   Date of Service  10/27/2018  HPI/Events of Note  Hypercapnia  eICU Interventions  Respiratory rate increased to 20        Okoronkwo U Ogan 10/27/2018, 5:19 AM

## 2018-10-27 NOTE — Progress Notes (Signed)
[employer] [facility] [place] [facility]   PATIENT NAME: Kylie Monroe    MR#:  [id number]  DATE OF BIRTH:  12/30/1955  SUBJECTIVE:   patient on vent, FiO2 60%, febrile today No family members at bedside  REVIEW OF SYSTEMS:    Unable to obtain patient intubated    Tolerating Diet:NPO      DRUG ALLERGIES:  No Known Allergies  VITALS:  Blood pressure 129/79, pulse (!) 124, temperature (!) 100.6 F (38.1 C), temperature source Bladder, resp. rate 20, height 5\' 2"  (1.575 m), weight 62.8 kg, SpO2 96 %.  PHYSICAL EXAMINATION:  Constitutional: Appears thin intubated awake but not following commands. No distress. HENT: Normocephalic.  ET tube intact Eyes: Conjunctivae  are normal. PERRLA, no scleral icterus.  Neck: Normal ROM. Neck supple. No JVD CVS: RRR, S1/S2 +, no murmurs, no gallops, no carotid bruit.  Pulmonary: Effort and breath sounds normal, no stridor, rhonchi, wheezes, rales.  Abdominal: Soft. BS +,  no distension, tenderness, rebound or guarding.  Musculoskeletal:  No edema and no tenderness.  Neuro: awake on vent. t. No focal deficits. Skin: Skin is warm and dry. No rash noted.     LABORATORY PANEL:   CBC Recent Labs  Lab 10/26/18 1401  WBC 9.3  HGB 9.0*  HCT 29.4*  PLT 92*   ------------------------------------------------------------------------------------------------------------------  Chemistries  Recent Labs  Lab 10/27/18 0453  NA 144  K 5.1  CL 108  CO2 33*  GLUCOSE 156*  BUN 37*  CREATININE 0.44  CALCIUM 7.9*  MG 2.4  AST 44*  ALT 19  ALKPHOS 83  BILITOT 0.7   ------------------------------------------------------------------------------------------------------------------  Cardiac Enzymes Recent Labs  Lab 10/24/18 0512 10/25/18 0618  TROPONINI 0.04* 0.03*   ------------------------------------------------------------------------------------------------------------------  RADIOLOGY:  Dg Chest Port 1  View  Result Date: 10/27/2018 CLINICAL DATA:  Pneumonia. EXAM: PORTABLE CHEST 1 VIEW COMPARISON:  Radiograph of October 26, 2018. FINDINGS: Stable cardiomediastinal silhouette. Endotracheal and nasogastric tubes are unchanged in position. No pneumothorax is noted. Stable large right lung opacity is noted. Stable mild left basilar opacity is noted. Bony thorax is unremarkable. IMPRESSION: Stable support apparatus. Stable bilateral lung opacities as described above, right greater than left. Electronically Signed   By: Lupita RaiderJames  Green Jr, M.D.   On: 10/27/2018 07:27   Dg Chest Port 1 View  Result Date: 10/26/2018 CLINICAL DATA:  Pneumonia. EXAM: PORTABLE CHEST 1 VIEW COMPARISON:  Radiograph of October 25, 2018. FINDINGS: Stable cardiomediastinal silhouette. Endotracheal and nasogastric tubes are unchanged in position. No pneumothorax is noted. Stable right lung opacity is noted concerning for pneumonia with probable associated pleural effusion. Mild left basilar atelectasis or infiltrate is noted. Bony thorax is unremarkable. IMPRESSION: Stable support apparatus. Stable right lung opacity is noted concerning for pneumonia. Stable left basilar atelectasis or infiltrate is noted. Electronically Signed   By: Lupita RaiderJames  Green Jr, M.D.   On: 10/26/2018 07:25     ASSESSMENT AND PLAN:   62 year old female with history of COPD presented with shortness of breath.  1.  Acute hypoxic respiratory failure requiring intubation due to pneumonia and COPD exacerbation: Vent management by intensivist.  SBAT trials per intensivist Continue steroids.  Fentanyl and propofol for sedation  2.  Streptococcus pneumonia bacteremia with septic shock: Clinically worsening with pulmonary congestion and febrile patient only on Rocephin, change antibiotics to vancomycin and Zosyn and discontinue Rocephin Diuretics for pulmonary congestion Keep map greater than 65 Blood culture Streptococcus pneumonia, follow-up on the repeat  cultures and pulmonology  might consider bronc for deep respiratory cultures if no clinical improvement Endotracheal aspirate was done on 11/17  3.  Gram-negative UTI: Follow-up on urine culture E. coli sensitive to ceftriaxone and Zosyn Currently patient is on Vanco and Zosyn  4.  Acute encephalopathy with cocaine intoxication: Patient is status post lumbar puncture in the emergency room Initial CSF work-up was negative.  CSF cultures are negative discontinue ganciclovir  5.  Electrolytes: Replete as needed  6.  Thrombocytopenia from septic shock: Monitor closely.  Lovenox DC'd SCDs  7.  Urine toxicology positive for cocaine Management plans discussed with the patient's husband and he is  Poor prognosis with high mortality  CODE STATUS: Full  TOTAL TIME TAKING CARE OF THIS PATIENT: 33 minutes.     POSSIBLE D/C ?days, DEPENDING ON CLINICAL CONDITION.   Ramonita Lab M.D on 10/27/2018 at 2:42 PM  Between 7am to 6pm - Pager - [phone] After 6pm go to [email or web address] - Social research officer, government  [facility]  Office  [phone]  CC: Primary care physician; Donaciano Eva, FNP  Note: This dictation was prepared with Dragon dictation along with smaller phrase technology. Any transcriptional errors that result from this process are unintentional.

## 2018-10-27 NOTE — Progress Notes (Signed)
MEDICATION RELATED CONSULT NOTE    Pharmacy Consult for Electrolyte Management Indication: hypokalemia  Labs: Recent Labs    10/25/18 0618 10/26/18 0540 10/26/18 1401 10/27/18 0453  WBC 17.5* 8.5 9.3  --   HGB 9.8* 8.6* 9.0*  --   HCT 32.0* 27.6* 29.4*  --   PLT 98* 87* 92*  --   CREATININE 0.48 0.36*  --  0.44  MG 2.0 2.4  --  2.4  PHOS 2.3* 2.3*  --  2.8  ALBUMIN 2.1* 2.5*  --  2.2*  PROT 5.5* 5.9*  --  5.8*  AST 62* 48*  --  44*  ALT 21 18  --  19  ALKPHOS 61 77  --  83  BILITOT 1.2 0.7  --  0.7   Potassium (mmol/L)  Date Value  10/27/2018 5.1  03/13/2014 3.7   Magnesium (mg/dL)  Date Value  16/10/960411/20/2019 2.4   Phosphorus (mg/dL)  Date Value  54/09/811911/20/2019 2.8   Calcium (mg/dL)  Date Value  14/78/295611/20/2019 7.9 (L)   Calcium, Total (mg/dL)  Date Value  21/30/865704/05/2014 8.3 (L)   Albumin (g/dL)  Date Value  84/69/629511/20/2019 2.2 (L)  03/14/2014 2.3 (L)  ] Estimated Creatinine Clearance: 65.2 mL/min (by C-G formula based on SCr of 0.44 mg/dL).     Assessment: Patient is a 62yo female admitted for respiratory distress. Pharmacy consulted for electrolyte management.  11/20 K=5.1, Phos=2.8, Mag=2.4  Goal of Therapy:  Maintain electrolytes within range  Plan:  No replacement needed at this time.  Will recheck electrolytes with AM labs and continue to replace as needed.   Gardner CandleSheema M Cabria Micalizzi, PharmD, BCPS Clinical Pharmacist 10/27/2018 7:45 AM

## 2018-10-27 NOTE — Progress Notes (Signed)
eLink Physician-Brief Progress Note Patient Name: Kylie Monroe DOB: 11/06/1956 MRN: [id number]   Date of Service  10/27/2018  HPI/Events of Note  Sub-optimal sedation on the vebtilator  eICU Interventions  Ativan 2 mg iv prn in addition to propofol and Fentanyl        Urania Pearlman U Leiloni Smithers 10/27/2018, 2:20 AM

## 2018-10-27 NOTE — Consult Note (Signed)
Pharmacy Antibiotic Note  Kylie Monroe is a 62 y.o. female admitted on 10/24/2018 with pneumonia.  Pharmacy has been consulted for vancomycin and zosyn dosing.  Plan: Ke: 0.059 T1/2: 11.75 VD: 43.96 Vancomycin 1gm X 1 followed by Vancomycin 750 IV every 12 hours with 6 hour stack dosing  .  Goal trough 15-20 mcg/mL. Calculated trough at Css 16.3. Trough level prior to 4th dose.  Zosyn 3.375g IV q8h (4 hour infusion).  Height: 5\' 2"  (157.5 cm) Weight: 138 lb 7.2 oz (62.8 kg) IBW/kg (Calculated) : 50.1  Temp (24hrs), Avg:99.9 F (37.7 C), Min:98.4 F (36.9 C), Max:101.7 F (38.7 C)  Recent Labs  Lab 10/24/18 0512 10/24/18 1010 10/25/18 0618 10/26/18 0540 10/26/18 1401 10/26/18 1553 10/27/18 0453  WBC 12.5*  --  17.5* 8.5 9.3  --   --   CREATININE 0.58  --  0.48 0.36*  --   --  0.44  LATICACIDVEN 4.3* 3.0* 2.1*  --   --  1.6  --     Estimated Creatinine Clearance: 65.2 mL/min (by C-G formula based on SCr of 0.44 mg/dL).    No Known Allergies  Antimicrobials this admission: 1117 Cefepime >> 1120 1117 Ceftriaxone >>1120 1120 Vancomycin 1120 Zosyn  Dose adjustments this admission:   Microbiology results: 1117 BCx: Strep Pneumoniae 1117 UCx: E. Coli  1117 Sputum/Respiratory: MSSA 1117 MRSA PCR: Negative  Thank you for allowing pharmacy to be a part of this patient's care.  Tejon Gracie 10/27/2018 11:55 AM

## 2018-10-28 ENCOUNTER — Inpatient Hospital Stay: Payer: Medicaid Other

## 2018-10-28 ENCOUNTER — Encounter: Payer: Self-pay | Admitting: Radiology

## 2018-10-28 ENCOUNTER — Inpatient Hospital Stay (HOSPITAL_COMMUNITY)
Admit: 2018-10-28 | Discharge: 2018-10-28 | Disposition: A | Discharge status: Home / Self Care | Payer: Medicaid Other | Attending: Internal Medicine | Admitting: Internal Medicine

## 2018-10-28 DIAGNOSIS — I361 Nonrheumatic tricuspid (valve) insufficiency: Secondary | ICD-10-CM

## 2018-10-28 DIAGNOSIS — R8271 Bacteriuria: Secondary | ICD-10-CM

## 2018-10-28 LAB — BLOOD GAS, ARTERIAL
ACID-BASE EXCESS: 13.3 mmol/L — AB (ref 0.0–2.0)
Bicarbonate: 39.9 mmol/L — ABNORMAL HIGH (ref 20.0–28.0)
FIO2: 0.5
Mechanical Rate: 20
O2 SAT: 89.5 %
PCO2 ART: 63 mmHg — AB (ref 32.0–48.0)
PEEP/CPAP: 5 cmH2O
PH ART: 7.41 (ref 7.350–7.450)
Patient temperature: 37
VT: 450 mL
pO2, Arterial: 57 mmHg — ABNORMAL LOW (ref 83.0–108.0)

## 2018-10-28 LAB — CBC WITH DIFFERENTIAL/PLATELET
Abs Immature Granulocytes: 1.26 10*3/uL — ABNORMAL HIGH (ref 0.00–0.07)
BASOS ABS: 0 10*3/uL (ref 0.0–0.1)
Basophils Relative: 0 %
EOS ABS: 0 10*3/uL (ref 0.0–0.5)
EOS PCT: 0 %
HEMATOCRIT: 26.4 % — AB (ref 36.0–46.0)
Hemoglobin: 8 g/dL — ABNORMAL LOW (ref 12.0–15.0)
Immature Granulocytes: 12 %
Lymphocytes Relative: 9 %
Lymphs Abs: 0.9 10*3/uL (ref 0.7–4.0)
MCH: 28.5 pg (ref 26.0–34.0)
MCHC: 30.3 g/dL (ref 30.0–36.0)
MCV: 94 fL (ref 80.0–100.0)
Monocytes Absolute: 0.4 10*3/uL (ref 0.1–1.0)
Monocytes Relative: 4 %
NRBC: 1.2 % — AB (ref 0.0–0.2)
Neutro Abs: 7.8 10*3/uL — ABNORMAL HIGH (ref 1.7–7.7)
Neutrophils Relative %: 75 %
Platelets: 114 10*3/uL — ABNORMAL LOW (ref 150–400)
RBC: 2.81 MIL/uL — AB (ref 3.87–5.11)
RDW: 20.4 % — ABNORMAL HIGH (ref 11.5–15.5)
WBC: 10.4 10*3/uL (ref 4.0–10.5)

## 2018-10-28 LAB — COMPREHENSIVE METABOLIC PANEL
ALK PHOS: 83 U/L (ref 38–126)
ALT: 23 U/L (ref 0–44)
ANION GAP: 8 (ref 5–15)
AST: 54 U/L — ABNORMAL HIGH (ref 15–41)
Albumin: 2.4 g/dL — ABNORMAL LOW (ref 3.5–5.0)
BILIRUBIN TOTAL: 1 mg/dL (ref 0.3–1.2)
BUN: 35 mg/dL — ABNORMAL HIGH (ref 6–20)
CALCIUM: 8 mg/dL — AB (ref 8.9–10.3)
CO2: 34 mmol/L — ABNORMAL HIGH (ref 22–32)
CREATININE: 0.53 mg/dL (ref 0.44–1.00)
Chloride: 104 mmol/L (ref 98–111)
Glucose, Bld: 97 mg/dL (ref 70–99)
Potassium: 4.3 mmol/L (ref 3.5–5.1)
Sodium: 146 mmol/L — ABNORMAL HIGH (ref 135–145)
TOTAL PROTEIN: 5.9 g/dL — AB (ref 6.5–8.1)

## 2018-10-28 LAB — ECHOCARDIOGRAM COMPLETE
HEIGHTINCHES: 62 in
WEIGHTICAEL: 2116.42 [oz_av]

## 2018-10-28 LAB — GLUCOSE, CAPILLARY
Glucose-Capillary: 102 mg/dL — ABNORMAL HIGH (ref 70–99)
Glucose-Capillary: 103 mg/dL — ABNORMAL HIGH (ref 70–99)
Glucose-Capillary: 108 mg/dL — ABNORMAL HIGH (ref 70–99)
Glucose-Capillary: 109 mg/dL — ABNORMAL HIGH (ref 70–99)
Glucose-Capillary: 117 mg/dL — ABNORMAL HIGH (ref 70–99)
Glucose-Capillary: 94 mg/dL (ref 70–99)
Glucose-Capillary: 97 mg/dL (ref 70–99)

## 2018-10-28 LAB — PHOSPHORUS: PHOSPHORUS: 2 mg/dL — AB (ref 2.5–4.6)

## 2018-10-28 LAB — PROCALCITONIN: Procalcitonin: 1.72 ng/mL

## 2018-10-28 LAB — MAGNESIUM: Magnesium: 2 mg/dL (ref 1.7–2.4)

## 2018-10-28 LAB — TRIGLYCERIDES: TRIGLYCERIDES: 133 mg/dL (ref <150)

## 2018-10-28 LAB — CALCIUM, IONIZED: Calcium, Ionized, Serum: 4.7 mg/dL (ref 4.5–5.6)

## 2018-10-28 MED ORDER — ALBUMIN HUMAN 25 % IV SOLN
50.0000 g | Freq: Once | INTRAVENOUS | Status: AC
  Administered 2018-10-28: 50 g via INTRAVENOUS
  Filled 2018-10-28: qty 200

## 2018-10-28 MED ORDER — FUROSEMIDE 10 MG/ML IJ SOLN
20.0000 mg | Freq: Every day | INTRAMUSCULAR | Status: DC
  Administered 2018-10-28 – 2018-10-29 (×2): 20 mg via INTRAVENOUS
  Filled 2018-10-28 (×2): qty 2

## 2018-10-28 MED ORDER — K PHOS MONO-SOD PHOS DI & MONO 155-852-130 MG PO TABS
500.0000 mg | ORAL_TABLET | Freq: Three times a day (TID) | ORAL | Status: AC
  Administered 2018-10-28 (×2): 500 mg
  Filled 2018-10-28 (×4): qty 2

## 2018-10-28 MED ORDER — IOHEXOL 350 MG/ML SOLN
75.0000 mL | Freq: Once | INTRAVENOUS | Status: AC | PRN
  Administered 2018-10-28: 75 mL via INTRAVENOUS

## 2018-10-28 MED ORDER — ALPRAZOLAM 0.25 MG PO TABS
0.2500 mg | ORAL_TABLET | Freq: Three times a day (TID) | ORAL | Status: DC
  Administered 2018-10-28 – 2018-10-29 (×5): 0.25 mg via ORAL
  Filled 2018-10-28 (×5): qty 1

## 2018-10-28 MED ORDER — ENOXAPARIN SODIUM 40 MG/0.4ML ~~LOC~~ SOLN
40.0000 mg | SUBCUTANEOUS | Status: DC
  Administered 2018-10-28 – 2018-11-01 (×5): 40 mg via SUBCUTANEOUS
  Filled 2018-10-28 (×5): qty 0.4

## 2018-10-28 NOTE — Consult Note (Signed)
Kylie Monroe is a 62 y.o. female with a history of HEPC, COPD, HTN was admitted from home on 11/17 with headache, confusion and incontinence. As per EMS note the patient's boyfriend had mentioned that she was confused for a few days .and In the ED she was noted to have fever. 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 CT head was unremarkable she had a LP which was normal. She had resp distress with pulse ox of 80% and was intubated in the ED. Urine tox screen positive for Cocaine. CXR showed RLL infiltrate. Pt was admitted to ICU. Blood culture came back as pneumococcus. She was started on vanco and ceftriaxone and then deescalated to ceftriaxone. She spiked another fever today and antibiotics were broadened to vanco and zosyn. I am consulted for the same. Objective:  VITALS:  BP (!) 99/52   Pulse (!) 108   Temp 100 F (37.8 C) (Bladder)   Resp (!) 21   Ht 5\' 2"  (1.575 m)   Wt 60 kg   SpO2 95%   BMI 24.19 kg/m  PHYSICAL EXAM:  General: intubated and sedated Head: Normocephalic, without obvious abnormality, atraumatic. Eyes: Conjunctivae clear, anicteric sclerae. Pupils are equal ENT did not examine ET tube and NG tube Neck: did not examine Back: Did not examine Lungs: b/l air entry decreased rt side , crepts present Heart: Regular rate and rhythm, no murmur, rub or gallop. Abdomen: Soft, non-tender,not distended. Bowel sounds normal. No masses Extremities: atraumatic, no cyanosis. No edema. No clubbing Skin: No rashes or lesions. Or bruising Lymph: Cervical, supraclavicular normal. Neurologic: cannot be examined as sedated Pertinent Labs Lab Results CBC    Component Value Date/Time   WBC 10.4 10/28/2018 0408   RBC 2.81 (L) 10/28/2018 0408   HGB 8.0 (L) 10/28/2018 0408   HGB 13.7 03/13/2014 1603   HCT 26.4 (L) 10/28/2018 0408   HCT 41.7 03/13/2014 1603   PLT 114 (L) 10/28/2018 0408   PLT 100 (L) 03/13/2014 1603   MCV 94.0  10/28/2018 0408   MCV 102 (H) 03/13/2014 1603   MCH 28.5 10/28/2018 0408   MCHC 30.3 10/28/2018 0408   RDW 20.4 (H) 10/28/2018 0408   RDW 15.1 (H) 03/13/2014 1603   LYMPHSABS 0.9 10/28/2018 0408   MONOABS 0.4 10/28/2018 0408   EOSABS 0.0 10/28/2018 0408   BASOSABS 0.0 10/28/2018 0408    CMP Latest Ref Rng & Units 10/28/2018 10/27/2018 10/26/2018  Glucose 70 - 99 mg/dL 97 161(W) 960(A)  BUN 6 - 20 mg/dL 54(U) 98(J) 19(J)  Creatinine 0.44 - 1.00 mg/dL 4.78 2.95 6.21(H)  Sodium 135 - 145 mmol/L 146(H) 144 141  Potassium 3.5 - 5.1 mmol/L 4.3 5.1 3.6  Chloride 98 - 111 mmol/L 104 108 106  CO2 22 - 32 mmol/L 34(H) 33(H) 30  Calcium 8.9 - 10.3 mg/dL 8.0(L) 7.9(L) 8.3(L)  Total Protein 6.5 - 8.1 g/dL 5.9(L) 5.8(L) 5.9(L)  Total Bilirubin 0.3 - 1.2 mg/dL 1.0 0.7 0.7  Alkaline Phos 38 - 126 U/L 83 83 77  AST 15 - 41 U/L 54(H) 44(H) 48(H)  ALT 0 - 44 U/L 23 19 18       Microbiology: Recent Results (from the past 240 hour(s))  Urine culture     Status: Abnormal   Collection Time: 10/24/18  4:56 AM  Result Value Ref Range Status   Specimen Description   Final    URINE, RANDOM Performed at [facility], [address] [place], [place] [address]    Special Requests   Final    NONE Performed at [facility], [address] [place], [place] [address]    Culture >=100,000 COLONIES/mL ESCHERICHIA COLI (A)  Final   Report Status 10/26/2018 FINAL  Final   Organism ID, Bacteria ESCHERICHIA COLI (A)  Final      Susceptibility   Escherichia coli - MIC*    AMPICILLIN >=32 RESISTANT Resistant     CEFAZOLIN 8 SENSITIVE Sensitive     CEFTRIAXONE <=1 SENSITIVE Sensitive     CIPROFLOXACIN >=4 RESISTANT Resistant     GENTAMICIN <=1 SENSITIVE Sensitive     IMIPENEM 0.5 SENSITIVE Sensitive     NITROFURANTOIN <=16 SENSITIVE Sensitive     TRIMETH/SULFA <=20 SENSITIVE Sensitive     AMPICILLIN/SULBACTAM >=32 RESISTANT Resistant     PIP/TAZO <=4 SENSITIVE Sensitive      Extended ESBL NEGATIVE Sensitive     * >=100,000 COLONIES/mL ESCHERICHIA COLI  Culture, blood (routine x 2)     Status: Abnormal   Collection Time: 10/24/18  5:12 AM  Result Value Ref Range Status   Specimen Description   Final    BLOOD RIGHT ANTECUBITAL Performed at [facility], [address], [place], [place] [address]    Special Requests   Final    BOTTLES DRAWN AEROBIC AND ANAEROBIC Blood Culture results may not be optimal due to an excessive volume of blood received in culture bottles Performed at [facility], [address] [place], [place] [address]    Culture  Setup Time   Final    GRAM POSITIVE COCCI AEROBIC BOTTLE ONLY CRITICAL RESULT CALLED TO, READ BACK BY AND VERIFIED WITH: Kevin Fenton PATEL @2118  10/24/18 AKT Performed at [facility], [address], [place], [place] [address]    Culture STREPTOCOCCUS PNEUMONIAE (A)  Final   Report Status 10/27/2018 FINAL  Final   Organism ID, Bacteria STREPTOCOCCUS PNEUMONIAE  Final      Susceptibility   Streptococcus pneumoniae - MIC*    ERYTHROMYCIN <=0.12 SENSITIVE Sensitive     LEVOFLOXACIN 0.5 SENSITIVE Sensitive     VANCOMYCIN 0.5 SENSITIVE Sensitive     PENICILLIN (non-meningitis) <=0.06 SENSITIVE Sensitive     CEFTRIAXONE (non-meningitis) <=0.12 SENSITIVE Sensitive     * STREPTOCOCCUS PNEUMONIAE  Blood Culture ID Panel (Reflexed)     Status: Abnormal   Collection Time: 10/24/18  5:12 AM  Result Value Ref Range Status   Enterococcus species NOT DETECTED NOT DETECTED Final   Listeria monocytogenes NOT DETECTED NOT DETECTED Final   Staphylococcus species NOT DETECTED NOT DETECTED Final   Staphylococcus aureus (BCID) NOT DETECTED NOT DETECTED Final   Streptococcus species DETECTED (A) NOT DETECTED Final    Comment: CRITICAL RESULT CALLED TO, READ BACK BY AND VERIFIED WITH: Kevin Fenton PATEL @2118  10/24/18 AKT    Streptococcus agalactiae NOT DETECTED NOT DETECTED Final   Streptococcus pneumoniae  DETECTED (A) NOT DETECTED Final    Comment: CRITICAL RESULT CALLED TO, READ BACK BY AND VERIFIED WITH: KISHAN PATEL @2118  10/24/18 AKT    Streptococcus pyogenes NOT DETECTED NOT DETECTED Final   Acinetobacter baumannii NOT DETECTED NOT DETECTED Final   Enterobacteriaceae species NOT DETECTED NOT DETECTED Final   Enterobacter cloacae complex NOT DETECTED NOT DETECTED Final   Escherichia coli NOT DETECTED NOT DETECTED Final   Klebsiella oxytoca NOT DETECTED NOT DETECTED Final   Klebsiella pneumoniae NOT DETECTED NOT DETECTED Final   Proteus species NOT  DETECTED NOT DETECTED Final   Serratia marcescens NOT DETECTED NOT DETECTED Final   Haemophilus influenzae NOT DETECTED NOT DETECTED Final   Neisseria meningitidis NOT DETECTED NOT DETECTED Final   Pseudomonas aeruginosa NOT DETECTED NOT DETECTED Final   Candida albicans NOT DETECTED NOT DETECTED Final   Candida glabrata NOT DETECTED NOT DETECTED Final   Candida krusei NOT DETECTED NOT DETECTED Final   Candida parapsilosis NOT DETECTED NOT DETECTED Final   Candida tropicalis NOT DETECTED NOT DETECTED Final    Comment: Performed at [facility], [address] [place], [place] [address]  CSF culture     Status: None   Collection Time: 10/24/18  5:54 AM  Result Value Ref Range Status   Specimen Description   Final    CSF Performed at [facility], [address], [place], [place] [address]    Special Requests   Final    NONE Performed at [facility], [address] [place], [place] [address]    Gram Stain   Final    NO ORGANISMS SEEN RED BLOOD CELLS PRESENT WBC SEEN CYTOSPIN SMEAR    Culture   Final    NO GROWTH 3 DAYS Performed at [facility], [address], [place], [place] [address]    Report Status 10/27/2018 FINAL  Final  MRSA PCR Screening     Status: None   Collection Time: 10/24/18  8:03 AM  Result Value Ref Range Status   MRSA by PCR NEGATIVE NEGATIVE Final    Comment:          The GeneXpert MRSA Assay (FDA approved for NASAL specimens only), is one component of a comprehensive MRSA colonization surveillance program. It is not intended to diagnose MRSA infection nor to guide or monitor treatment for MRSA infections. Performed at [facility], [address] [place], [place] [address]   Expectorated sputum assessment w rflx to resp cult     Status: None   Collection Time: 10/24/18  8:39 AM  Result Value Ref Range Status   Specimen Description SPUTUM  Final   Special Requests NONE  Final   Sputum evaluation   Final    THIS SPECIMEN IS ACCEPTABLE FOR SPUTUM CULTURE ENDOTRACHEAL SPUEVA CREDITED WITH RETAINED RESULTS. [facility] 10/24/18 AT 0858 Performed at [facility], [address], [place], [place] [address]    Report Status 10/24/2018 FINAL  Final  Culture, respiratory     Status: None   Collection Time: 10/24/18  8:39 AM  Result Value Ref Range Status   Specimen Description   Final    SPUTUM Performed at [facility], [address] [place], [place] [address]    Special Requests   Final    NONE Reflexed from [phone] Performed at [facility], [address] [place], [place] [address]    Gram Stain   Final    FEW WBC PRESENT, PREDOMINANTLY PMN FEW GRAM POSITIVE COCCI Performed at [facility], [address], [place], [place] [address]    Culture   Final    RARE STREPTOCOCCUS PNEUMONIAE RARE STAPHYLOCOCCUS AUREUS    Report Status 10/27/2018 FINAL  Final   Organism ID, Bacteria STAPHYLOCOCCUS AUREUS  Final   Organism ID, Bacteria STREPTOCOCCUS PNEUMONIAE  Final      Susceptibility   Staphylococcus aureus - MIC*    CIPROFLOXACIN <=0.5 SENSITIVE Sensitive     ERYTHROMYCIN <=0.25 SENSITIVE Sensitive     GENTAMICIN <=0.5 SENSITIVE Sensitive  OXACILLIN 0.5 SENSITIVE Sensitive     TETRACYCLINE <=1 SENSITIVE Sensitive     VANCOMYCIN <=0.5 SENSITIVE Sensitive     TRIMETH/SULFA <=10 SENSITIVE  Sensitive     CLINDAMYCIN <=0.25 SENSITIVE Sensitive     RIFAMPIN <=0.5 SENSITIVE Sensitive     Inducible Clindamycin NEGATIVE Sensitive     * RARE STAPHYLOCOCCUS AUREUS   Streptococcus pneumoniae - MIC*    ERYTHROMYCIN <=0.12 SENSITIVE Sensitive     LEVOFLOXACIN 0.5 SENSITIVE Sensitive     VANCOMYCIN 0.5 SENSITIVE Sensitive     PENICILLIN (meningitis) <=0.06 SENSITIVE Sensitive     PENICILLIN (non-meningitis) <=0.06 SENSITIVE Sensitive     CEFTRIAXONE (non-meningitis) <=0.12 SENSITIVE Sensitive     CEFTRIAXONE (meningitis) <=0.12 SENSITIVE Sensitive     * RARE STREPTOCOCCUS PNEUMONIAE  CULTURE, BLOOD (ROUTINE X 2) w Reflex to ID Panel     Status: None (Preliminary result)   Collection Time: 10/27/18  1:01 PM  Result Value Ref Range Status   Specimen Description BLOOD BLOOD RIGHT HAND  Final   Special Requests   Final    BOTTLES DRAWN AEROBIC AND ANAEROBIC Blood Culture adequate volume   Culture   Final    NO GROWTH < 24 HOURS Performed at [facility], [address] [place], [place] [address]    Report Status PENDING  Incomplete  CULTURE, BLOOD (ROUTINE X 2) w Reflex to ID Panel     Status: None (Preliminary result)   Collection Time: 10/27/18  2:07 PM  Result Value Ref Range Status   Specimen Description BLOOD BLOOD RIGHT HAND  Final   Special Requests   Final    BOTTLES DRAWN AEROBIC AND ANAEROBIC Blood Culture adequate volume   Culture   Final    NO GROWTH < 24 HOURS Performed at [facility], [address] [place], [place] [address]    Report Status PENDING  Incomplete  Culture, respiratory (non-expectorated)     Status: None (Preliminary result)   Collection Time: 10/27/18  6:31 PM  Result Value Ref Range Status   Specimen Description   Final    TRACHEAL ASPIRATE Performed at [facility], [address], [place], [place] [address]    Special Requests   Final    NONE Performed at [facility], [address]  [place], [place] [address]    Gram Stain   Final    MODERATE WBC PRESENT, PREDOMINANTLY MONONUCLEAR NO ORGANISMS SEEN    Culture   Final    NO GROWTH < 12 HOURS Performed at [facility], [address], [place], [place] [address]    Report Status PENDING  Incomplete   IMAGING RESULTS: CXR reviewed rt lower lobe pneumonia ? Impression/Recommendation 62 y.o. female with a history of HEPC, COPD, HTN was admitted from home on 11/17 with headache, confusion and incontinence. As per EMS note the patient's boyfriend had mentioned that she was confused for a few days .and In the ED she was noted to have fever. 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 CT head was unremarkable she had a LP which was normal. She had resp distress with pulse ox of 80% and was intubated in the ED. Urine tox screen positive for Cocaine. CXR showed RLL infiltrate. Pt was admitted to ICU. Blood culture came back as pneumococcus. She was started on vanco and ceftriaxone and then deescalated to ceftriaxone. She spiked another fever today and antibiotics  were broadened to vanco and zosyn. I am consulted for the same.  ?Strep pneumoniae bacteremia secondary to rt sided pneumonia  Rt lung Pneumonia due to pneumococcus /rare staph in the sputum- the antibiotics given for pneumococcus should cover staph as well ? Acute hypoxic resp failure- due to pneumonia and underlying COPD -- intubated on 10/24/18 in the ED  New fever and increasing Fio2 - r/o mucous plug, fluid overload, empyema May need bronch /CT chest. Currently the antibiotic has broadened to vanco and zosyn. Was on ceftriaxone before  echo to look for vegetations. Done but not reported yet If resp culture from 11/20 remains neg can switch vanco/zosyn to ceftriaxone in a day or so  E.coli in the urine  Polysubstance use- + cocaine  HEPC treated before   COPD  Discussed the management with  intensivist and her  partner

## 2018-10-28 NOTE — Care Management Note (Signed)
Case Management Note  Patient Details  Name: Kylie HuntsmanSharon D Schwall MRN: [id number] Date of Birth: 05/23/1956  Subjective/Objective:   Patient admitted to ICU for acute respiratory failure.  Patient arrived to the ED from home by EMS with AMS.  Patient lives with her partner of 30 years, they are not married but he states they have been together and living together for the past 30 years.  Patient is currently vented and sedated.  Significant other reports that at home patient was independent in ADL's and drove up until 2 months ago.  He provides transportation for her.  He was not sure about her PCP but she sees a doctor every 2 weeks for her suboxone.  Significant other reports she has been taking suboxone for 18 years now.  At home the significant other reports that they do have a wheelchair and walker and a cane.  He reports that he will need help with the patient when she is discharged.  RNCM reassured significant other that case management team is here for him and the patient to assist with discharge planning.  Significant other reports that the patient has 2 daughters they were here yesterday to visit, and the patient has a sister, currently in rest home in [place] stroke, and a stepfather.  RNCM will cont to follow for discharge needs as patient condition progresses.  Robbie LisJeanna Keryl Gholson RN BSN [phone]                  Action/Plan:   Expected Discharge Date:  10/30/18               Expected Discharge Plan:     In-House Referral:     Discharge planning Services  CM Consult  Post Acute Care Choice:    Choice offered to:     DME Arranged:    DME Agency:     HH Arranged:    HH Agency:     Status of Service:  In process, will continue to follow  If discussed at Long Length of Stay Meetings, dates discussed:    Additional Comments:  Allayne ButcherJeanna M Joshual Terrio, RN 10/28/2018, 11:22 AM

## 2018-10-28 NOTE — Progress Notes (Signed)
[facility] [place] [facility]   PATIENT NAME: Kylie Monroe    MR#:  [id number]  DATE OF BIRTH:  06-17-1956  SUBJECTIVE:   patient on vent, FiO2 60%, febrile  No family members at bedside  REVIEW OF SYSTEMS:    Unable to obtain patient intubated   Tolerating Diet:NPO  DRUG ALLERGIES:  No Known Allergies  VITALS:  Blood pressure (!) 104/56, pulse (!) 110, temperature 100 F (37.8 C), temperature source Bladder, resp. rate 20, height 5\' 2"  (1.575 m), weight 60 kg, SpO2 94 %.  PHYSICAL EXAMINATION:  Constitutional: Appears thin intubated , not following commands. No distress. HENT: Normocephalic.  ET tube intact Eyes: Conjunctivae  are normal. PERRLA, no scleral icterus.  Neck: Normal ROM. Neck supple. No JVD CVS: RRR, S1/S2 +, no murmurs, no gallops, no carotid bruit.  Pulmonary: Effort and breath sounds normal, no stridor, rhonchi, wheezes, rales.  Abdominal: Soft. BS +,  no distension, tenderness, rebound or guarding.  Musculoskeletal:  No edema and no tenderness.  Neuro: sedated on vent. No focal deficits. Skin: Skin is warm and dry. No rash noted.  LABORATORY PANEL:   CBC Recent Labs  Lab 10/28/18 0408  WBC 10.4  HGB 8.0*  HCT 26.4*  PLT 114*   ------------------------------------------------------------------------------------------------------------------  Chemistries  Recent Labs  Lab 10/28/18 0408  NA 146*  K 4.3  CL 104  CO2 34*  GLUCOSE 97  BUN 35*  CREATININE 0.53  CALCIUM 8.0*  MG 2.0  AST 54*  ALT 23  ALKPHOS 83  BILITOT 1.0   ------------------------------------------------------------------------------------------------------------------  Cardiac Enzymes Recent Labs  Lab 10/24/18 0512 10/25/18 0618  TROPONINI 0.04* 0.03*   ------------------------------------------------------------------------------------------------------------------  RADIOLOGY:  Ct Angio Chest Pe W Or Wo Contrast  Result Date:  10/28/2018 CLINICAL DATA:  Acute hypoxic respiratory failure. Streptococcal pneumoniae bacteremia with septic shock. EXAM: CT ANGIOGRAPHY CHEST WITH CONTRAST TECHNIQUE: Multidetector CT imaging of the chest was performed using the standard protocol during bolus administration of intravenous contrast. Multiplanar CT image reconstructions and MIPs were obtained to evaluate the vascular anatomy. CONTRAST:  75mL OMNIPAQUE IOHEXOL 350 MG/ML SOLN COMPARISON:  Chest x-ray 10/28/2018.  Chest CT 12/06/2015. FINDINGS: Cardiovascular: Mild cardiomegaly. No filling defects in the pulmonary arteries to suggest pulmonary emboli. Mediastinum/Nodes: Endotracheal tube tip is in the distal trachea. No mediastinal, hilar, or axillary adenopathy. Lungs/Pleura: Moderate centrilobular and paraseptal emphysema. Dense consolidation throughout the right lung and also noted in the left lower lobe. Findings compatible with multifocal pneumonia. No effusions. Upper Abdomen: Ascites noted around the liver. There is suggestion of nodular liver surface suggesting cirrhosis. Suspect splenomegaly although the spleen is not imaged in its entirety on this chest CT. Musculoskeletal: No acute bony abnormality. Chest wall soft tissues are unremarkable. Review of the MIP images confirms the above findings. IMPRESSION: Dense consolidation diffusely throughout the right lung and also noted in the left lower lobe compatible with pneumonia. Cardiomegaly.  No evidence of pulmonary embolus. Ascites in the upper abdomen. Suspect splenomegaly and probable cirrhosis. Electronically Signed   By: Charlett Nose M.D.   On: 10/28/2018 11:16   Dg Chest Port 1 View  Result Date: 10/28/2018 CLINICAL DATA:  Pneumonia EXAM: PORTABLE CHEST 1 VIEW COMPARISON:  Yesterday FINDINGS: Advanced endotracheal tube reaching the right mainstem bronchus. Recommend retraction by ~ 4 cm. An orogastric tube reaches the stomach. Extensive pneumonia on the right. Pneumonia and likely  atelectasis at the left base. No Kerley lines or effusion. No pneumothorax. These results will  be called to the ordering clinician or representative by the Radiologist Assistant, and communication documented in the PACS or zVision Dashboard. IMPRESSION: 1. Advancement of endotracheal tube into the right mainstem bronchus. Recommend retraction by approximately 4 cm. 2. Unchanged pneumonia on the right more than left. Electronically Signed   By: Marnee SpringJonathon  Watts M.D.   On: 10/28/2018 07:14   Dg Chest Port 1 View  Result Date: 10/27/2018 CLINICAL DATA:  Pneumonia. EXAM: PORTABLE CHEST 1 VIEW COMPARISON:  Radiograph of October 26, 2018. FINDINGS: Stable cardiomediastinal silhouette. Endotracheal and nasogastric tubes are unchanged in position. No pneumothorax is noted. Stable large right lung opacity is noted. Stable mild left basilar opacity is noted. Bony thorax is unremarkable. IMPRESSION: Stable support apparatus. Stable bilateral lung opacities as described above, right greater than left. Electronically Signed   By: Lupita RaiderJames  Green Jr, M.D.   On: 10/27/2018 07:27     ASSESSMENT AND PLAN:   62 year old female with history of COPD presented with shortness of breath.  1.  Acute hypoxic respiratory failure requiring intubation due to pneumonia and COPD exacerbation: Vent management by intensivist.  SBAT trials per intensivist Continue steroids.  Fentanyl and propofol for sedation  2.  Streptococcus pneumonia bacteremia with septic shock:  Clinically worsening   changed antibiotics to vancomycin and Zosyn and discontinue Rocephin Diuretics for pulmonary congestion Keep map greater than 65 Blood culture Streptococcus pneumonia, follow-up on the repeat cultures and pulmonology might consider bronc for deep respiratory cultures if no clinical improvement Endotracheal aspirate was done on 11/17  CT chest.  3.  Gram-negative UTI: Follow-up on urine culture E. coli sensitive to ceftriaxone and  Zosyn Currently patient is on Vanco and Zosyn  4.  Acute encephalopathy with cocaine intoxication: Patient is status post lumbar puncture in the emergency room Initial CSF work-up was negative.  CSF cultures are negative discontinue ganciclovir  5.  Electrolytes: Replete as needed  6.  Thrombocytopenia from septic shock: Monitor closely.  Lovenox DC'd SCDs  7.  Urine toxicology positive for cocaine Management plans discussed with the patient's husband and he is  Poor prognosis with high mortality  CODE STATUS: Full  TOTAL TIME TAKING CARE OF THIS PATIENT: 33 minutes.   POSSIBLE D/C ?days, DEPENDING ON CLINICAL CONDITION.   Altamese DillingVaibhavkumar Zaiden Ludlum M.D on 10/28/2018 at 4:13 PM  Between 7am to 6pm - Pager - [phone] After 6pm go to [email or web address] - Social research officer, governmentpassword EPAS [facility]  [facility]  Office  [phone]  CC: Primary care physician; Donaciano EvaWoodruff, Sarah, FNP  Note: This dictation was prepared with Dragon dictation along with smaller phrase technology. Any transcriptional errors that result from this process are unintentional.

## 2018-10-28 NOTE — Progress Notes (Signed)
*  PRELIMINARY RESULTS* Echocardiogram 2D Echocardiogram has been performed.  Cristela BlueHege, Dimitrios Balestrieri 10/28/2018, 10:24 AM

## 2018-10-28 NOTE — Progress Notes (Signed)
MEDICATION RELATED CONSULT NOTE    Pharmacy Consult for Electrolyte Management Indication: hypokalemia  Labs: Recent Labs    10/26/18 0540 10/26/18 1401 10/27/18 0453 10/28/18 0408  WBC 8.5 9.3  --  10.4  HGB 8.6* 9.0*  --  8.0*  HCT 27.6* 29.4*  --  26.4*  PLT 87* 92*  --  114*  CREATININE 0.36*  --  0.44 0.53  MG 2.4  --  2.4 2.0  PHOS 2.3*  --  2.8 2.0*  ALBUMIN 2.5*  --  2.2* 2.4*  PROT 5.9*  --  5.8* 5.9*  AST 48*  --  44* 54*  ALT 18  --  19 23  ALKPHOS 77  --  83 83  BILITOT 0.7  --  0.7 1.0   Potassium (mmol/L)  Date Value  10/28/2018 4.3  03/13/2014 3.7   Magnesium (mg/dL)  Date Value  40/98/119111/21/2019 2.0   Phosphorus (mg/dL)  Date Value  47/82/956211/21/2019 2.0 (L)   Calcium (mg/dL)  Date Value  13/08/657811/21/2019 8.0 (L)   Calcium, Total (mg/dL)  Date Value  46/96/295204/05/2014 8.3 (L)   Albumin (g/dL)  Date Value  84/13/244011/21/2019 2.4 (L)  03/14/2014 2.3 (L)  ] Estimated Creatinine Clearance: 59.1 mL/min (by C-G formula based on SCr of 0.53 mg/dL).     Assessment: Patient is a 62yo female admitted for respiratory distress. Pharmacy consulted for electrolyte management.  11/21 K=4.3, Phos=2.0, Mag=2.0  Goal of Therapy:  Maintain electrolytes within range  Plan:  Will order Phos Neutral 500mg  x 3 doses.  Will recheck electrolytes with AM labs and continue to replace as needed.   Gardner CandleSheema M Denzil Bristol, PharmD, BCPS Clinical Pharmacist 10/28/2018 7:25 AM

## 2018-10-28 NOTE — Consult Note (Signed)
Pharmacy Antibiotic Note  Kylie HuntsmanSharon D Monroe is a 62 y.o. female admitted on 10/24/2018 with pneumonia.  Pharmacy has been consulted for vancomycin and zosyn dosing. Patient also has positive blood cultures (Streptococcus pneumoniae) and positive Ucx (> 100,000 E. Coli). Resp culture showing rate strep pneumo and MSSA). ID has been consulted.   Plan: Ke: 0.059    T1/2: 11.75  VD: 43.96  Continue Vancomycin 750 IV every 12 hours with 6 hour stack dosing  .  Goal trough 15-20 mcg/mL. Calculated trough at Css 16.3. Trough level prior to 4th dose.  Continue Zosyn 3.375g IV q8h (4 hour infusion).  Height: 5\' 2"  (157.5 cm) Weight: 132 lb 4.4 oz (60 kg) IBW/kg (Calculated) : 50.1  Temp (24hrs), Avg:101.3 F (38.5 C), Min:99.7 F (37.6 C), Max:102.2 F (39 C)  Recent Labs  Lab 10/24/18 0512 10/24/18 1010 10/25/18 0618 10/26/18 0540 10/26/18 1401 10/26/18 1553 10/27/18 0453 10/28/18 0408  WBC 12.5*  --  17.5* 8.5 9.3  --   --  10.4  CREATININE 0.58  --  0.48 0.36*  --   --  0.44 0.53  LATICACIDVEN 4.3* 3.0* 2.1*  --   --  1.6  --   --     Estimated Creatinine Clearance: 59.1 mL/min (by C-G formula based on SCr of 0.53 mg/dL).    No Known Allergies  Antimicrobials this admission: 11/17 Cefepime >>  11/20 11/17 Ceftriaxone >> 11/20 11/20 Vancomycin >> 11/20 Zosyn>>   Dose adjustments this admission:   Microbiology results: 11/17 BCx: Strep Pneumoniae 11/17 UCx: E. Coli  11/17 Sputum/Respiratory: MSSA 11/17 MRSA PCR: Negative  Thank you for allowing pharmacy to be a part of this patient's care.  Kylie CandleSheema M Ysabel Monroe, PharmD, BCPS Clinical Pharmacist 10/28/2018 12:40 PM

## 2018-10-28 NOTE — Progress Notes (Signed)
Name: Kylie Monroe MRN: [id number] DOB: 04-02-1956     CONSULTATION DATE: 10/24/2018 Subjective & objectives: Febrile with T-max 102.2.  Restless with tapering sedation, responding to diuresis and continues to require high FiO2  PAST MEDICAL HISTORY :   has a past medical history of COPD (chronic obstructive pulmonary disease) ([place]), Hepatitis C, and Hypertension.  has a past surgical history that includes Cholecystectomy and prolapsed rectum. Prior to Admission medications   Medication Sig Start Date End Date Taking? Authorizing Provider  albuterol (PROAIR HFA) 108 (90 Base) MCG/ACT inhaler Inhale 90 mcg into the lungs as needed. 01/25/13   [provider]  aspirin 81 MG chewable tablet Chew 81 mg by mouth daily. 09/19/16   [provider]  bisacodyl (DULCOLAX) 5 MG EC tablet Take 5 mg by mouth daily. 09/25/08   [provider]  Buprenorphine HCl-Naloxone HCl (SUBOXONE) 8-2 MG FILM Take 8.5 Film by mouth 3 (three) times daily. 09/25/16   [provider]  FLOVENT HFA 220 MCG/ACT inhaler Inhale 220 mcg into the lungs 2 (two) times daily. 12/10/17   [provider]  gabapentin (NEURONTIN) 300 MG capsule Take 300 mg by mouth daily. 09/19/16   [provider]   No Known Allergies  FAMILY HISTORY:  family history includes Cancer in her mother. SOCIAL HISTORY:  reports that she has been smoking cigarettes. She has a 44.00 pack-year smoking history. She has never used smokeless tobacco. She reports that she has current or past drug history. Drug: Cocaine. She reports that she does not drink alcohol.  REVIEW OF SYSTEMS:   Unable to obtain due to critical illness   VITAL SIGNS: Temp:  [99.7 F (37.6 C)-102.2 F (39 C)] 99.7 F (37.6 C) (11/21 0900) Pulse Rate:  [100-124] 113 (11/21 1000) Resp:  [17-24] 19 (11/21 1000) BP: (93-138)/(53-79) 116/67 (11/21 1000) SpO2:  [91 %-96 %] 91 % (11/21 1000) FiO2 (%):  [50 %-60 %] 60 % (11/21  1000) Weight:  [60 kg] 60 kg (11/21 0340)  Physical Examination: Sedated with propofol and fentanylto RASS of -3.Followed command with tapering down sedation, however restless and the synchronizing with the ventilator On vent, no distress, bilateral equal air entry with no adventitious sounds.  Minimal clear secretions S1 & S2 are audible with no murmur Benign abdominal exam with feeble peristalsis No leg edema  ASSESSMENT / PLAN:  Acute respiratory failure. P/F 114 on 50% PEEP of 5 -Didn't tolerate SBT because of restlessness, anxiety and dyssynchronization with the vent.  -Optimize Xanax -Monitor ABG, optimize vent settings and continuous ventilator support  Pneumonia.improvedbilateral airspace disease Rt >Lt with Pl. Eff. And pulmonary congestion.respiratory culture growing GPC.MRSA PCR negative Febrile, continues to have high FIO2 requirment and concern for VAP -Vanc + Zosyn, s/p Rocephin. Monitor CXR + CBC + FiO2 -Diuresis to improve lung compliance -CT chest  -Consider bronchoscope and BAL if no improvement -Follow with repeat cultures and consider bronch for deep respiratory cultures  COPD -Optimize bronchodilators + inhaled steroids and tapering systemic steroids  UTI. E Coli -ABX as per above. Monitor urine culture  Sepsis. Streptococcus pneumonia bacteremia. Procalcitonin 1.99 to 1.72. BLD and Res cult on 11/20 remains -ve -Monitor cultures, ECHO and follow with ID -Vanc + Zosyn -Optimize hydration, monitor cultures and procalcitonin  Altered mental status with cocaine intoxication.CT head negative for acute intracranial abnormalities -Initial CSF work-up was negative -Consider DC acyclovir as final CSF culture report  Hypertriglyceridemia due to propofol -Optimize propofol and monitor triglycerides  Anemia -Keep hemoglobin more than 7 g/dL  Thrombocytopenia (improved) -Resume Lovenox and monitor platelet  count  Hypophosphatemia -Replete and monitor electrolytes  Full code  DVT &GI prophylaxis. Continue with supportive care.  Mr.Fairbank and daughters were updated at the bedside  Critical care time 45 min

## 2018-10-29 ENCOUNTER — Inpatient Hospital Stay: Payer: Medicaid Other

## 2018-10-29 DIAGNOSIS — F1190 Opioid use, unspecified, uncomplicated: Secondary | ICD-10-CM

## 2018-10-29 LAB — CALCIUM, IONIZED: CALCIUM, IONIZED, SERUM: 4.8 mg/dL (ref 4.5–5.6)

## 2018-10-29 LAB — COMPREHENSIVE METABOLIC PANEL
ALBUMIN: 2.8 g/dL — AB (ref 3.5–5.0)
ALT: 22 U/L (ref 0–44)
AST: 43 U/L — ABNORMAL HIGH (ref 15–41)
Alkaline Phosphatase: 75 U/L (ref 38–126)
Anion gap: 7 (ref 5–15)
BUN: 28 mg/dL — ABNORMAL HIGH (ref 6–20)
CHLORIDE: 102 mmol/L (ref 98–111)
CO2: 37 mmol/L — AB (ref 22–32)
Calcium: 7.8 mg/dL — ABNORMAL LOW (ref 8.9–10.3)
Creatinine, Ser: 0.41 mg/dL — ABNORMAL LOW (ref 0.44–1.00)
GFR calc Af Amer: >60 mL/min (ref >60)
GFR calc non Af Amer: >60 mL/min (ref >60)
GLUCOSE: 134 mg/dL — AB (ref 70–99)
POTASSIUM: 3.8 mmol/L (ref 3.5–5.1)
SODIUM: 146 mmol/L — AB (ref 135–145)
Total Bilirubin: 1.2 mg/dL (ref 0.3–1.2)
Total Protein: 6.2 g/dL — ABNORMAL LOW (ref 6.5–8.1)

## 2018-10-29 LAB — CBC WITH DIFFERENTIAL/PLATELET
ABS IMMATURE GRANULOCYTES: 0.9 10*3/uL — AB (ref 0.00–0.07)
BASOS ABS: 0 10*3/uL (ref 0.0–0.1)
BASOS PCT: 0 %
EOS ABS: 0.1 10*3/uL (ref 0.0–0.5)
EOS PCT: 1 %
HCT: 26.5 % — ABNORMAL LOW (ref 36.0–46.0)
Hemoglobin: 8 g/dL — ABNORMAL LOW (ref 12.0–15.0)
IMMATURE GRANULOCYTES: 12 %
Lymphocytes Relative: 6 %
Lymphs Abs: 0.5 10*3/uL — ABNORMAL LOW (ref 0.7–4.0)
MCH: 28.5 pg (ref 26.0–34.0)
MCHC: 30.2 g/dL (ref 30.0–36.0)
MCV: 94.3 fL (ref 80.0–100.0)
Monocytes Absolute: 0.3 10*3/uL (ref 0.1–1.0)
Monocytes Relative: 4 %
NEUTROS PCT: 77 %
NRBC: 0.4 % — AB (ref 0.0–0.2)
Neutro Abs: 5.8 10*3/uL (ref 1.7–7.7)
Platelets: 109 10*3/uL — ABNORMAL LOW (ref 150–400)
RBC: 2.81 MIL/uL — AB (ref 3.87–5.11)
RDW: 20.2 % — AB (ref 11.5–15.5)
SMEAR REVIEW: NORMAL
WBC: 7.6 10*3/uL (ref 4.0–10.5)

## 2018-10-29 LAB — GLUCOSE, CAPILLARY
Glucose-Capillary: 113 mg/dL — ABNORMAL HIGH (ref 70–99)
Glucose-Capillary: 129 mg/dL — ABNORMAL HIGH (ref 70–99)
Glucose-Capillary: 121 mg/dL — ABNORMAL HIGH (ref 70–99)
Glucose-Capillary: 135 mg/dL — ABNORMAL HIGH (ref 70–99)
Glucose-Capillary: 95 mg/dL (ref 70–99)
Glucose-Capillary: 92 mg/dL (ref 70–99)

## 2018-10-29 LAB — VANCOMYCIN, TROUGH: VANCOMYCIN TR: 8 ug/mL — AB (ref 15–20)

## 2018-10-29 LAB — BLOOD GAS, ARTERIAL
ACID-BASE EXCESS: 16.2 mmol/L — AB (ref 0.0–2.0)
Bicarbonate: 42.9 mmol/L — ABNORMAL HIGH (ref 20.0–28.0)
FIO2: 0.5
O2 SAT: 99.1 %
PATIENT TEMPERATURE: 37
PCO2 ART: 59 mmHg — AB (ref 32.0–48.0)
PEEP: 5 cmH2O
PH ART: 7.47 — AB (ref 7.350–7.450)
RATE: 20 resp/min
VT: 450 mL
pO2, Arterial: 82 mmHg — ABNORMAL LOW (ref 83.0–108.0)

## 2018-10-29 LAB — URINE CULTURE: Culture: NO GROWTH

## 2018-10-29 LAB — MAGNESIUM: MAGNESIUM: 1.9 mg/dL (ref 1.7–2.4)

## 2018-10-29 LAB — TRIGLYCERIDES: Triglycerides: 141 mg/dL (ref <150)

## 2018-10-29 LAB — PROCALCITONIN: PROCALCITONIN: 0.89 ng/mL

## 2018-10-29 LAB — PHOSPHORUS: PHOSPHORUS: 2.2 mg/dL — AB (ref 2.5–4.6)

## 2018-10-29 MED ORDER — VANCOMYCIN HCL IN DEXTROSE 750-5 MG/150ML-% IV SOLN
750.0000 mg | Freq: Three times a day (TID) | INTRAVENOUS | Status: DC
  Administered 2018-10-29 – 2018-10-31 (×7): 750 mg via INTRAVENOUS
  Filled 2018-10-29 (×9): qty 150

## 2018-10-29 MED ORDER — POTASSIUM PHOSPHATES 15 MMOLE/5ML IV SOLN
15.0000 mmol | Freq: Once | INTRAVENOUS | Status: AC
  Administered 2018-10-29: 15 mmol via INTRAVENOUS
  Filled 2018-10-29: qty 5

## 2018-10-29 MED ORDER — ALPRAZOLAM 0.5 MG PO TABS
0.5000 mg | ORAL_TABLET | Freq: Two times a day (BID) | ORAL | Status: DC
  Administered 2018-10-29 – 2018-11-05 (×13): 0.5 mg via ORAL
  Filled 2018-10-29 (×14): qty 1

## 2018-10-29 MED ORDER — BISACODYL 10 MG RE SUPP
10.0000 mg | Freq: Once | RECTAL | Status: AC
  Administered 2018-11-01: 10 mg via RECTAL
  Filled 2018-10-29: qty 1

## 2018-10-29 MED ORDER — FUROSEMIDE 10 MG/ML IJ SOLN
20.0000 mg | Freq: Two times a day (BID) | INTRAMUSCULAR | Status: DC
  Administered 2018-10-29 – 2018-10-31 (×4): 20 mg via INTRAVENOUS
  Filled 2018-10-29 (×4): qty 2

## 2018-10-29 MED ORDER — BUPRENORPHINE HCL 2 MG SL SUBL
8.0000 mg | SUBLINGUAL_TABLET | Freq: Every day | SUBLINGUAL | Status: DC
  Administered 2018-10-30: 8 mg via SUBLINGUAL

## 2018-10-29 MED ORDER — MORPHINE SULFATE (PF) 4 MG/ML IV SOLN
4.0000 mg | Freq: Once | INTRAVENOUS | Status: AC
  Administered 2018-10-29: 4 mg via INTRAVENOUS
  Filled 2018-10-29: qty 1

## 2018-10-29 NOTE — Consult Note (Signed)
Kylie Monroe is a 62 y.o. female with a history of HEPC, COPD, HTN was admitted from home on 11/17 with headache, confusion and incontinence. As per EMS note the patient's boyfriend had mentioned that she was confused for a few days .and In the ED she was noted to have fever. 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 CT head was unremarkable she had a LP which was normal. She had resp distress with pulse ox of 80% and was intubated in the ED. Urine tox screen positive for Cocaine. CXR showed RLL infiltrate. Pt was admitted to ICU. Blood culture came back as pneumococcus. She was started on vanco and ceftriaxone and then deescalated to ceftriaxone. She spiked another fever today and antibiotics were broadened to vanco and zosyn.  Subjective Status quo  Objective:  VITALS:  BP 121/70   Pulse (!) 109   Temp (!) 101.3 F (38.5 C)   Resp 20   Ht 5\' 2"  (1.575 m)   Wt 60.3 kg   SpO2 96%   BMI 24.31 kg/m  PHYSICAL EXAM:  General: intubated and sedated Head: Normocephalic, without obvious abnormality, atraumatic. Eyes: Conjunctivae clear, anicteric sclerae. Pupils are equal ENT did not examine ET tube and NG tube Neck: did not examine Back: Did not examine Lungs: b/l air entry decreased rt side , crepts present Heart: Regular rate and rhythm, no murmur, rub or gallop. Abdomen: Soft, non-tender,not distended. Bowel sounds normal. No masses Extremities: atraumatic, no cyanosis. No edema. No clubbing Skin: No rashes or lesions. Or bruising Lymph: Cervical, supraclavicular normal. Neurologic: cannot be examined as sedated Pertinent Labs Lab Results CBC    Component Value Date/Time   WBC 7.6 10/29/2018 0543   RBC 2.81 (L) 10/29/2018 0543   HGB 8.0 (L) 10/29/2018 0543   HGB 13.7 03/13/2014 1603   HCT 26.5 (L) 10/29/2018 0543   HCT 41.7 03/13/2014 1603   PLT 109 (L) 10/29/2018 0543   PLT 100 (L) 03/13/2014 1603   MCV 94.3 10/29/2018  0543   MCV 102 (H) 03/13/2014 1603   MCH 28.5 10/29/2018 0543   MCHC 30.2 10/29/2018 0543   RDW 20.2 (H) 10/29/2018 0543   RDW 15.1 (H) 03/13/2014 1603   LYMPHSABS 0.5 (L) 10/29/2018 0543   MONOABS 0.3 10/29/2018 0543   EOSABS 0.1 10/29/2018 0543   BASOSABS 0.0 10/29/2018 0543    CMP Latest Ref Rng & Units 10/29/2018 10/28/2018 10/27/2018  Glucose 70 - 99 mg/dL 161(W) 97 960(A)  BUN 6 - 20 mg/dL 54(U) 98(J) 19(J)  Creatinine 0.44 - 1.00 mg/dL 4.78(G) 9.56 2.13  Sodium 135 - 145 mmol/L 146(H) 146(H) 144  Potassium 3.5 - 5.1 mmol/L 3.8 4.3 5.1  Chloride 98 - 111 mmol/L 102 104 108  CO2 22 - 32 mmol/L 37(H) 34(H) 33(H)  Calcium 8.9 - 10.3 mg/dL 7.8(L) 8.0(L) 7.9(L)  Total Protein 6.5 - 8.1 g/dL 6.2(L) 5.9(L) 5.8(L)  Total Bilirubin 0.3 - 1.2 mg/dL 1.2 1.0 0.7  Alkaline Phos 38 - 126 U/L 75 83 83  AST 15 - 41 U/L 43(H) 54(H) 44(H)  ALT 0 - 44 U/L 22 23 19       Microbiology: Recent Results (from the past 240 hour(s))  Urine culture     Status: Abnormal   Collection Time: 10/24/18  4:56 AM  Result Value Ref Range Status   Specimen Description   Final    URINE, RANDOM Performed at [facility], [address] [place], [place] [address]    Special Requests   Final    NONE Performed at [facility], [address] [place], [place] [address]    Culture >=100,000 COLONIES/mL ESCHERICHIA COLI (A)  Final   Report Status 10/26/2018 FINAL  Final   Organism ID, Bacteria ESCHERICHIA COLI (A)  Final      Susceptibility   Escherichia coli - MIC*    AMPICILLIN >=32 RESISTANT Resistant     CEFAZOLIN 8 SENSITIVE Sensitive     CEFTRIAXONE <=1 SENSITIVE Sensitive     CIPROFLOXACIN >=4 RESISTANT Resistant     GENTAMICIN <=1 SENSITIVE Sensitive     IMIPENEM 0.5 SENSITIVE Sensitive     NITROFURANTOIN <=16 SENSITIVE Sensitive     TRIMETH/SULFA <=20 SENSITIVE Sensitive     AMPICILLIN/SULBACTAM >=32 RESISTANT Resistant     PIP/TAZO <=4 SENSITIVE Sensitive      Extended ESBL NEGATIVE Sensitive     * >=100,000 COLONIES/mL ESCHERICHIA COLI  Culture, blood (routine x 2)     Status: Abnormal   Collection Time: 10/24/18  5:12 AM  Result Value Ref Range Status   Specimen Description   Final    BLOOD RIGHT ANTECUBITAL Performed at [facility], [address] [place], [place] [address]    Special Requests   Final    BOTTLES DRAWN AEROBIC AND ANAEROBIC Blood Culture results may not be optimal due to an excessive volume of blood received in culture bottles Performed at [facility], [address] [place], [place] [address]    Culture  Setup Time   Final    GRAM POSITIVE COCCI AEROBIC BOTTLE ONLY CRITICAL RESULT CALLED TO, READ BACK BY AND VERIFIED WITH: Kevin FentonKISHAN PATEL @2118  10/24/18 AKT Performed at [facility], [address] [place], [place] [address]    Culture STREPTOCOCCUS PNEUMONIAE (A)  Final   Report Status 10/27/2018 FINAL  Final   Organism ID, Bacteria STREPTOCOCCUS PNEUMONIAE  Final      Susceptibility   Streptococcus pneumoniae - MIC*    ERYTHROMYCIN <=0.12 SENSITIVE Sensitive     LEVOFLOXACIN 0.5 SENSITIVE Sensitive     VANCOMYCIN 0.5 SENSITIVE Sensitive     PENICILLIN (non-meningitis) <=0.06 SENSITIVE Sensitive     CEFTRIAXONE (non-meningitis) <=0.12 SENSITIVE Sensitive     * STREPTOCOCCUS PNEUMONIAE  Blood Culture ID Panel (Reflexed)     Status: Abnormal   Collection Time: 10/24/18  5:12 AM  Result Value Ref Range Status   Enterococcus species NOT DETECTED NOT DETECTED Final   Listeria monocytogenes NOT DETECTED NOT DETECTED Final   Staphylococcus species NOT DETECTED NOT DETECTED Final   Staphylococcus aureus (BCID) NOT DETECTED NOT DETECTED Final   Streptococcus species DETECTED (A) NOT DETECTED Final    Comment: CRITICAL RESULT CALLED TO, READ BACK BY AND VERIFIED WITH: KISHAN PATEL @2118  10/24/18 AKT    Streptococcus agalactiae NOT DETECTED NOT DETECTED Final   Streptococcus pneumoniae  DETECTED (A) NOT DETECTED Final    Comment: CRITICAL RESULT CALLED TO, READ BACK BY AND VERIFIED WITH: Kevin FentonKISHAN PATEL @2118  10/24/18 AKT    Streptococcus pyogenes NOT DETECTED NOT DETECTED Final   Acinetobacter baumannii NOT DETECTED NOT DETECTED Final   Enterobacteriaceae species NOT DETECTED NOT DETECTED Final   Enterobacter cloacae complex NOT DETECTED NOT DETECTED Final   Escherichia coli NOT DETECTED NOT DETECTED Final   Klebsiella oxytoca NOT DETECTED NOT DETECTED Final   Klebsiella pneumoniae NOT DETECTED NOT DETECTED Final   Proteus species NOT DETECTED NOT  DETECTED Final   Serratia marcescens NOT DETECTED NOT DETECTED Final   Haemophilus influenzae NOT DETECTED NOT DETECTED Final   Neisseria meningitidis NOT DETECTED NOT DETECTED Final   Pseudomonas aeruginosa NOT DETECTED NOT DETECTED Final   Candida albicans NOT DETECTED NOT DETECTED Final   Candida glabrata NOT DETECTED NOT DETECTED Final   Candida krusei NOT DETECTED NOT DETECTED Final   Candida parapsilosis NOT DETECTED NOT DETECTED Final   Candida tropicalis NOT DETECTED NOT DETECTED Final    Comment: Performed at [facility], [address] [place], [place] [address]  CSF culture     Status: None   Collection Time: 10/24/18  5:54 AM  Result Value Ref Range Status   Specimen Description   Final    CSF Performed at [facility], [address], [place], [place] [address]    Special Requests   Final    NONE Performed at [facility], [address] [place], [place] [address]    Gram Stain   Final    NO ORGANISMS SEEN RED BLOOD CELLS PRESENT WBC SEEN CYTOSPIN SMEAR    Culture   Final    NO GROWTH 3 DAYS Performed at [facility], [address], [place], [place] [address]    Report Status 10/27/2018 FINAL  Final  MRSA PCR Screening     Status: None   Collection Time: 10/24/18  8:03 AM  Result Value Ref Range Status   MRSA by PCR NEGATIVE NEGATIVE Final    Comment:          The GeneXpert MRSA Assay (FDA approved for NASAL specimens only), is one component of a comprehensive MRSA colonization surveillance program. It is not intended to diagnose MRSA infection nor to guide or monitor treatment for MRSA infections. Performed at [facility], [address] [place], [place] [address]   Expectorated sputum assessment w rflx to resp cult     Status: None   Collection Time: 10/24/18  8:39 AM  Result Value Ref Range Status   Specimen Description SPUTUM  Final   Special Requests NONE  Final   Sputum evaluation   Final    THIS SPECIMEN IS ACCEPTABLE FOR SPUTUM CULTURE ENDOTRACHEAL SPUEVA CREDITED WITH RETAINED RESULTS. [facility] 10/24/18 AT 0858 Performed at [facility], [address], [place], [place] [address]    Report Status 10/24/2018 FINAL  Final  Culture, respiratory     Status: None   Collection Time: 10/24/18  8:39 AM  Result Value Ref Range Status   Specimen Description   Final    SPUTUM Performed at [facility], [address], [place], [place] [address]    Special Requests   Final    NONE Reflexed from [id number] Performed at [facility], [address] [place], [place] [address]    Gram Stain   Final    FEW WBC PRESENT, PREDOMINANTLY PMN FEW GRAM POSITIVE COCCI Performed at [facility], [address] [place], [place] [address]    Culture   Final    RARE STREPTOCOCCUS PNEUMONIAE RARE STAPHYLOCOCCUS AUREUS    Report Status 10/27/2018 FINAL  Final   Organism ID, Bacteria STAPHYLOCOCCUS AUREUS  Final   Organism ID, Bacteria STREPTOCOCCUS PNEUMONIAE  Final      Susceptibility   Staphylococcus aureus - MIC*    CIPROFLOXACIN <=0.5 SENSITIVE Sensitive     ERYTHROMYCIN <=0.25 SENSITIVE Sensitive     GENTAMICIN <=0.5 SENSITIVE Sensitive  OXACILLIN 0.5 SENSITIVE Sensitive     TETRACYCLINE <=1 SENSITIVE Sensitive     VANCOMYCIN <=0.5 SENSITIVE Sensitive     TRIMETH/SULFA <=10 SENSITIVE  Sensitive     CLINDAMYCIN <=0.25 SENSITIVE Sensitive     RIFAMPIN <=0.5 SENSITIVE Sensitive     Inducible Clindamycin NEGATIVE Sensitive     * RARE STAPHYLOCOCCUS AUREUS   Streptococcus pneumoniae - MIC*    ERYTHROMYCIN <=0.12 SENSITIVE Sensitive     LEVOFLOXACIN 0.5 SENSITIVE Sensitive     VANCOMYCIN 0.5 SENSITIVE Sensitive     PENICILLIN (meningitis) <=0.06 SENSITIVE Sensitive     PENICILLIN (non-meningitis) <=0.06 SENSITIVE Sensitive     CEFTRIAXONE (non-meningitis) <=0.12 SENSITIVE Sensitive     CEFTRIAXONE (meningitis) <=0.12 SENSITIVE Sensitive     * RARE STREPTOCOCCUS PNEUMONIAE  CULTURE, BLOOD (ROUTINE X 2) w Reflex to ID Panel     Status: None (Preliminary result)   Collection Time: 10/27/18  1:01 PM  Result Value Ref Range Status   Specimen Description BLOOD BLOOD RIGHT HAND  Final   Special Requests   Final    BOTTLES DRAWN AEROBIC AND ANAEROBIC Blood Culture adequate volume   Culture   Final    NO GROWTH 2 DAYS Performed at [facility], [address], [place], [place] [address]    Report Status PENDING  Incomplete  CULTURE, BLOOD (ROUTINE X 2) w Reflex to ID Panel     Status: None (Preliminary result)   Collection Time: 10/27/18  2:07 PM  Result Value Ref Range Status   Specimen Description BLOOD BLOOD RIGHT HAND  Final   Special Requests   Final    BOTTLES DRAWN AEROBIC AND ANAEROBIC Blood Culture adequate volume   Culture   Final    NO GROWTH 2 DAYS Performed at [facility], [address] [place], [place] [address]    Report Status PENDING  Incomplete  Culture, respiratory (non-expectorated)     Status: None (Preliminary result)   Collection Time: 10/27/18  6:31 PM  Result Value Ref Range Status   Specimen Description   Final    TRACHEAL ASPIRATE Performed at [facility], [address], [place], [place] [address]    Special Requests   Final    NONE Performed at [facility], [address] [place], [place]  [address]    Gram Stain   Final    MODERATE WBC PRESENT, PREDOMINANTLY MONONUCLEAR NO ORGANISMS SEEN    Culture   Final    NO GROWTH 2 DAYS Performed at [facility], [address], [place], [place] [address]    Report Status PENDING  Incomplete  Urine Culture     Status: None   Collection Time: 10/28/18  2:07 AM  Result Value Ref Range Status   Specimen Description   Final    URINE, CATHETERIZED Performed at [facility], [address], [place], [place] [address]    Special Requests   Final    NONE Performed at [facility], [address], [place], [place] [address]    Culture   Final    NO GROWTH Performed at [facility], [address], [place], [place] [address]    Report Status 10/29/2018 FINAL  Final   IMAGING RESULTS: CXR CT chest reviewed personally  ? Impression/Recommendation 62 y.o. female with a history of HEPC, COPD, HTN was admitted from home on 11/17 with headache, confusion and incontinence. As per EMS note the patient's boyfriend had mentioned  that she was confused for a few days .and In the ED she was noted to have fever. 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 CT head was unremarkable she had a LP which was normal. She had resp distress with pulse ox of 80% and was intubated in the ED. Urine tox screen positive for Cocaine. CXR showed RLL infiltrate. Pt was admitted to ICU. Blood culture came back as pneumococcus. She was started on vanco and ceftriaxone and then deescalated to ceftriaxone. She spiked another fever today and antibiotics were broadened to vanco and zosyn. I am consulted for the same.  ?Strep pneumoniae bacteremia secondary to rt sided pneumonia  Rt lung Pneumonia due to pneumococcus /rare staph in the sputum- the antibiotics given for pneumococcus should cover staph as well. Repeat blood culture neg, echo no veg, Ct chest no empyema ? Acute hypoxic resp  failure- due to rt sided severe pneumonia and underlying COPD -- intubated on 10/24/18 in the ED  New fever and increasing Fio2 - r/o mucous plug, fluid overload, CT chest does not show empyema  May need bronch /CT chest. Currently the antibiotic has broadened to vanco and zosyn. Was on ceftriaxone before  echo does not show vegetations but limited aortic valve images Bronchoscopy done today. Depending on the culture will de-escalate antibiotic  E.coli in the urine  Polysubstance use- + cocaine  HEPC treated before   COPD  Discussed the management with  Her nurse

## 2018-10-29 NOTE — Progress Notes (Signed)
MEDICATION RELATED CONSULT NOTE    Pharmacy Consult for Electrolyte Management Indication: hypokalemia  Labs: Recent Labs    10/26/18 1401 10/27/18 0453 10/28/18 0408 10/29/18 0543  WBC 9.3  --  10.4  --   HGB 9.0*  --  8.0*  --   HCT 29.4*  --  26.4*  --   PLT 92*  --  114*  --   CREATININE  --  0.44 0.53 0.41*  MG  --  2.4 2.0 1.9  PHOS  --  2.8 2.0* 2.2*  ALBUMIN  --  2.2* 2.4* 2.8*  PROT  --  5.8* 5.9* 6.2*  AST  --  44* 54* 43*  ALT  --  19 23 22   ALKPHOS  --  83 83 75  BILITOT  --  0.7 1.0 1.2   Potassium (mmol/L)  Date Value  10/29/2018 3.8  03/13/2014 3.7   Magnesium (mg/dL)  Date Value  82/95/621311/22/2019 1.9   Phosphorus (mg/dL)  Date Value  08/65/784611/22/2019 2.2 (L)   Calcium (mg/dL)  Date Value  96/29/528411/22/2019 7.8 (L)   Calcium, Total (mg/dL)  Date Value  13/24/401004/05/2014 8.3 (L)   Albumin (g/dL)  Date Value  27/25/366411/22/2019 2.8 (L)  03/14/2014 2.3 (L)  ] Estimated Creatinine Clearance: 64 mL/min (A) (by C-G formula based on SCr of 0.41 mg/dL (L)).     Assessment: Patient is a 62yo female admitted for respiratory distress. Pharmacy consulted for electrolyte management.  11/22 K=3.8, Phos=2.2, Mag=1.9  Goal of Therapy:  Maintain electrolytes within range  Plan:  Will order KPhos 15mmol IV x 1 dose.  Will recheck electrolytes with AM labs and continue to replace as needed.   Gardner CandleSheema M Aaniya Sterba, PharmD, BCPS Clinical Pharmacist 10/29/2018 7:27 AM

## 2018-10-29 NOTE — Progress Notes (Signed)
[facility] - [place] at [facility]   PATIENT NAME: Kylie LeydenSharon Monroe    MR#:  [id number]  DATE OF BIRTH:  11/29/1956  SUBJECTIVE:   patient on vent, FiO2 50%, febrile  Boyfriend at bedside  REVIEW OF SYSTEMS:    Unable to obtain patient intubated   Tolerating Diet:NPO  DRUG ALLERGIES:  No Known Allergies  VITALS:  Blood pressure 121/70, pulse (!) 109, temperature (!) 101.3 F (38.5 C), resp. rate 20, height 5\' 2"  (1.575 m), weight 60.3 kg, SpO2 96 %.  PHYSICAL EXAMINATION:  Constitutional: Appears thin intubated , not following commands. No distress. HENT: Normocephalic.  ET tube intact Eyes: Conjunctivae  are normal. PERRLA, no scleral icterus.  Neck: Normal ROM. Neck supple. No JVD CVS: RRR, S1/S2 +, no murmurs, no gallops, no carotid bruit.  Pulmonary: Effort and breath sounds normal, no stridor, decreaed entry on right side, creps present on vent support.  Abdominal: Soft. BS +,  no distension, tenderness, rebound or guarding.  Musculoskeletal:  No edema and no tenderness.  Neuro: sedated on vent. No focal deficits. Skin: Skin is warm and dry. No rash noted.  LABORATORY PANEL:   CBC Recent Labs  Lab 10/29/18 0543  WBC 7.6  HGB 8.0*  HCT 26.5*  PLT 109*   ------------------------------------------------------------------------------------------------------------------  Chemistries  Recent Labs  Lab 10/29/18 0543  NA 146*  K 3.8  CL 102  CO2 37*  GLUCOSE 134*  BUN 28*  CREATININE 0.41*  CALCIUM 7.8*  MG 1.9  AST 43*  ALT 22  ALKPHOS 75  BILITOT 1.2   ------------------------------------------------------------------------------------------------------------------  Cardiac Enzymes Recent Labs  Lab 10/24/18 0512 10/25/18 0618  TROPONINI 0.04* 0.03*   ------------------------------------------------------------------------------------------------------------------  RADIOLOGY:  Ct Angio Chest Pe W Or Wo Contrast  Result Date:  10/28/2018 CLINICAL DATA:  Acute hypoxic respiratory failure. Streptococcal pneumoniae bacteremia with septic shock. EXAM: CT ANGIOGRAPHY CHEST WITH CONTRAST TECHNIQUE: Multidetector CT imaging of the chest was performed using the standard protocol during bolus administration of intravenous contrast. Multiplanar CT image reconstructions and MIPs were obtained to evaluate the vascular anatomy. CONTRAST:  75mL OMNIPAQUE IOHEXOL 350 MG/ML SOLN COMPARISON:  Chest x-ray 10/28/2018.  Chest CT 12/06/2015. FINDINGS: Cardiovascular: Mild cardiomegaly. No filling defects in the pulmonary arteries to suggest pulmonary emboli. Mediastinum/Nodes: Endotracheal tube tip is in the distal trachea. No mediastinal, hilar, or axillary adenopathy. Lungs/Pleura: Moderate centrilobular and paraseptal emphysema. Dense consolidation throughout the right lung and also noted in the left lower lobe. Findings compatible with multifocal pneumonia. No effusions. Upper Abdomen: Ascites noted around the liver. There is suggestion of nodular liver surface suggesting cirrhosis. Suspect splenomegaly although the spleen is not imaged in its entirety on this chest CT. Musculoskeletal: No acute bony abnormality. Chest wall soft tissues are unremarkable. Review of the MIP images confirms the above findings. IMPRESSION: Dense consolidation diffusely throughout the right lung and also noted in the left lower lobe compatible with pneumonia. Cardiomegaly.  No evidence of pulmonary embolus. Ascites in the upper abdomen. Suspect splenomegaly and probable cirrhosis. Electronically Signed   By: Charlett NoseKevin  Dover M.D.   On: 10/28/2018 11:16   Dg Chest Port 1 View  Result Date: 10/29/2018 CLINICAL DATA:  Atelectasis, intubation.  History of COPD. EXAM: PORTABLE CHEST 1 VIEW COMPARISON:  Chest radiograph October 29, 2018 FINDINGS: Endotracheal tube tip projects 16 mm above the carina. Nasogastric tube past proximal stomach, distal tip out of field of view. Diffuse  interstitial prominence with similar confluent RIGHT greater than LEFT alveolar  airspace opacities. Small RIGHT pleural effusion. No pneumothorax. Cardiac silhouette is mildly enlarged and unchanged. Mediastinal silhouette is not suspicious. Soft tissue planes and included osseous structures are nonacute. IMPRESSION: 1. Stable appearance of life support lines. 2. Stable interstitial and RIGHT greater LEFT alveolar airspace opacities concerning for pneumonia and underlying COPD. Electronically Signed   By: Awilda Metro M.D.   On: 10/29/2018 18:48   Dg Chest Port 1 View  Result Date: 10/29/2018 CLINICAL DATA:  Hypoxia EXAM: PORTABLE CHEST 1 VIEW COMPARISON:  Chest radiograph and chest CT October 28, 2018 FINDINGS: Endotracheal tube tip is 2.3 cm above the carina. Nasogastric tube tip and side port are below the diaphragm with side port seen in the stomach. No pneumothorax. There is extensive airspace consolidation throughout much of the right lung, with involvement of most of the right middle and lower lobes as well as much of the right upper lobe. There is patchy atelectasis and mild consolidation in the left base. Left lung elsewhere clear. Heart is upper normal in size with pulmonary vascularity normal. No adenopathy. No bone lesions. IMPRESSION: Tube positions as described without pneumothorax. Extensive airspace consolidation felt to represent multifocal pneumonia throughout the right lung. Left base atelectasis and patchy consolidation. The appearance is stable compared to 1 day prior. Note that a degree of aspiration must be a differential consideration given this appearance. Stable cardiac silhouette. Electronically Signed   By: Bretta Bang III M.D.   On: 10/29/2018 07:32   Dg Chest Port 1 View  Result Date: 10/28/2018 CLINICAL DATA:  Pneumonia EXAM: PORTABLE CHEST 1 VIEW COMPARISON:  Yesterday FINDINGS: Advanced endotracheal tube reaching the right mainstem bronchus. Recommend retraction  by ~ 4 cm. An orogastric tube reaches the stomach. Extensive pneumonia on the right. Pneumonia and likely atelectasis at the left base. No Kerley lines or effusion. No pneumothorax. These results will be called to the ordering clinician or representative by the Radiologist Assistant, and communication documented in the PACS or zVision Dashboard. IMPRESSION: 1. Advancement of endotracheal tube into the right mainstem bronchus. Recommend retraction by approximately 4 cm. 2. Unchanged pneumonia on the right more than left. Electronically Signed   By: Marnee Spring M.D.   On: 10/28/2018 07:14     ASSESSMENT AND PLAN:   62 year old female with history of COPD presented with shortness of breath.  1.  Acute hypoxic respiratory failure requiring intubation due to pneumonia and COPD exacerbation: Vent management by intensivist.  SBAT trials per intensivist Continue steroids.  Fentanyl and propofol for sedation  2.  Streptococcus pneumonia bacteremia with septic shock:  Clinically worsening   changed antibiotics to vancomycin and Zosyn and discontinue Rocephin Diuretics for pulmonary congestion Keep map greater than 65 Blood culture Streptococcus pneumonia, follow-up on the repeat cultures and pulmonology might consider bronc for deep respiratory cultures if no clinical improvement Endotracheal aspirate was done on 11/17  CT chest. Again bronch done 10/29/18- follow cultures.  3.  Gram-negative UTI: Follow-up on urine culture E. coli sensitive to ceftriaxone and Zosyn Currently patient is on Vanco and Zosyn  4.  Acute encephalopathy with cocaine intoxication: Patient is status post lumbar puncture in the emergency room Initial CSF work-up was negative.  CSF cultures are negative discontinue ganciclovir  5.  Electrolytes: Replete as needed  6.  Thrombocytopenia from septic shock: Monitor closely.  Lovenox DC'd SCDs  7.  Urine toxicology positive for cocaine Management plans discussed with the  patient's husband and he is  Poor prognosis with high  mortality  CODE STATUS: Full  TOTAL TIME TAKING CARE OF THIS PATIENT: 33 minutes.   POSSIBLE D/C ?days, DEPENDING ON CLINICAL CONDITION.   Altamese Dilling M.D on 10/29/2018 at 10:50 PM  Between 7am to 6pm - Pager - [phone] After 6pm go to [email or web address] - Social research officer, government  [facility] [place] [facility]  Office  [phone]  CC: Primary care physician; Donaciano Eva, FNP  Note: This dictation was prepared with Dragon dictation along with smaller phrase technology. Any transcriptional errors that result from this process are unintentional.

## 2018-10-29 NOTE — Procedures (Signed)
Bronchoscopy Procedure Note MAHRUKH SEGUIN [id number] Dec 07, 1956  Procedure: Bronchoscopy Indications: Obtain specimens for culture and/or other diagnostic studies and Remove secretions  Procedure Details Consent: Risks of procedure as well as the alternatives and risks of each were explained to the (patient/caregiver).  Consent for procedure obtained. Time Out: Verified patient identification, verified procedure, site/side was marked, verified correct patient position, special equipment/implants available, medications/allergies/relevent history reviewed, required imaging and test results available.  Performed  In preparation for procedure, patient was given 100% FiO2, bronchoscope lubricated and ETT was pulled back to 20 by the upper lip. Sedation: Patient was already sedated with Propofol + Fentanyl, received 4 mg of Morphine  Airway entered and the following bronchi were examined: RUL, RML, RLL, LUL, LLL and Bronchi.   Procedures performed: BAL Bronchoscope removed.    Description of the procedure: Fiberoptic bronchoscope was passed down the ET tube, tracheal ring and carina were visualized sharp and clean.  Bronchoscope was passed down the right mainstem bronchus, right upper, right lower and right middle bronchus were inspected.  Hyperemic response at the orifice of the right lower and middle bronchi, small amount of thick secretions were encountered at the orifice of the right lower bronchus, was suctioned to clear and a BAL was obtained in the mucus trap.  The bronchoscope was passed down the left main bronchus and I was able to inspect left upper, left lower and lingula.  BAL sample was obtained the form left lower bronchus.  Evaluation Hemodynamic Status: BP stable throughout; O2 sats: stable throughout Patient's Current Condition: stable Specimens:  Sent BAL from Rt L and Lt. L bronchus Complications: No apparent complications Patient did tolerate procedure well.   Nyella Eckels 10/29/2018

## 2018-10-29 NOTE — Progress Notes (Signed)
Name: Kylie Monroe MRN: [id number] DOB: August 16, 1956     CONSULTATION DATE: 10/24/2018  Subjective & Objectives:  Tmax 102.2. Marland Kitchen Propofol + Fentanyl  PAST MEDICAL HISTORY :   has a past medical history of COPD (chronic obstructive pulmonary disease) ([place]), Hepatitis C, and Hypertension.  has a past surgical history that includes Cholecystectomy and prolapsed rectum. Prior to Admission medications   Medication Sig Start Date End Date Taking? Authorizing Provider  albuterol (PROAIR HFA) 108 (90 Base) MCG/ACT inhaler Inhale 90 mcg into the lungs as needed. 01/25/13   [provider]  aspirin 81 MG chewable tablet Chew 81 mg by mouth daily. 09/19/16   [provider]  bisacodyl (DULCOLAX) 5 MG EC tablet Take 5 mg by mouth daily. 09/25/08   [provider]  Buprenorphine HCl-Naloxone HCl (SUBOXONE) 8-2 MG FILM Take 8.5 Film by mouth 3 (three) times daily. 09/25/16   [provider]  FLOVENT HFA 220 MCG/ACT inhaler Inhale 220 mcg into the lungs 2 (two) times daily. 12/10/17   [provider]  gabapentin (NEURONTIN) 300 MG capsule Take 300 mg by mouth daily. 09/19/16   [provider]   No Known Allergies  FAMILY HISTORY:  family history includes Cancer in her mother. SOCIAL HISTORY:  reports that she has been smoking cigarettes. She has a 44.00 pack-year smoking history. She has never used smokeless tobacco. She reports that she has current or past drug history. Drug: Cocaine. She reports that she does not drink alcohol.  REVIEW OF SYSTEMS:   Unable to obtain due to critical illness   VITAL SIGNS: Temp:  [99.9 F (37.7 C)-102.2 F (39 C)] 100.8 F (38.2 C) (11/22 0800) Pulse Rate:  [98-116] 109 (11/22 0700) Resp:  [19-21] 20 (11/22 0700) BP: (88-127)/(52-73) 121/70 (11/22 0700) SpO2:  [91 %-97 %] 95 % (11/22 0849) FiO2 (%):  [40 %-60 %] 40 % (11/22 0849) Weight:  [60.3 kg] 60.3 kg (11/22 0500)  Physical Examination: Sedated  with propofol and fentanylto RASS of -3.Followed command with tapering down sedation, however restless and the synchronizing with the ventilator On vent, no distress, bilateral equal air entry with no adventitious sounds.  Minimal clear secretions S1 & S2 are audible with no murmur Benign abdominal exam with feeble peristalsis No leg edema  ASSESSMENT / PLAN:  Acute respiratory failure.P/F 164 on 50% PEEP of 5 -SAT + SBT as tolerated -Monitor ABG, optimize vent settings and continuous ventilator support  Pneumonia.improvedbilateral airspace disease Rt >Lt with Pl. Eff.And pulmonary congestion.respiratory culture growing GPC.MRSA PCR negative Febrile,improved FIO2 requirment and concern for VAP -Vanc + Zosyn, s/pRocephin. Monitor CXR + CBC + FiO2 -Diuresis to improve lung compliance -Consider bronchoscope and BAL if no improvement -Follow with repeat cultures and consider bronch for deep respiratory cultures -ID for ABX.  COPD -Optimize bronchodilators + inhaled steroids and tapering systemic steroids  UTI. E Coli -ABX as per above. Monitor urine culture  Sepsis. Streptococcus pneumonia bacteremia. Procalcitonin 1.99 to 1.72. BLD and Res cult on 11/20 remains -ve -Monitor cultures, ECHO and follow with ID -Vanc + Zosyn -Optimize hydration, monitor cultures and procalcitonin  Altered mental status with cocaine intoxication.CT head negative for acute intracranial abnormalities -CSF cult -ve. S/p Acyclovir  Prerenal azotemia with mild hypernatremia (improved) -Optimize hydration, monitor renal panel and urine out put.  Hypertriglyceridemia due to propofol -Optimize propofol and monitor triglycerides  Anemia -Keep hemoglobin more than 7 g/dL  Thrombocytopenia (improved) -Resume Lovenox and monitor platelet count  Hypophosphatemia -Replete  and monitor electrolytes  Full code  DVT &GI prophylaxis. Continue with supportive care.  Mr.Saravia  and daughters wereupdatedat the bedside  Critical care time 45 min

## 2018-10-29 NOTE — Consult Note (Signed)
Pharmacy Antibiotic Note  Kylie Monroe is a 62 y.o. female admitted on 10/24/2018 with pneumonia.  Pharmacy has been consulted for vancomycin and zosyn dosing. Patient also has positive blood cultures (Streptococcus pneumoniae) and positive Ucx (> 100,000 E. Coli). Resp culture showing rate strep pneumo and MSSA). ID has been consulted.   Plan: NEW: Ke: 0.098    T1/2: 7.07  VD: 42.21  11/22 Vanc trough: 8. Level is subtherapeutic. Will change vancomycin regimen to  750 IV every 8 hours. Goal trough 15-20 mcg/mL. Calculated trough at Css 15.7. Trough level prior to 4th dose.  Continue Zosyn 3.375g IV q8h (4 hour infusion).  Height: 5\' 2"  (157.5 cm) Weight: 132 lb 15 oz (60.3 kg) IBW/kg (Calculated) : 50.1  Temp (24hrs), Avg:100.8 F (38.2 C), Min:99.7 F (37.6 C), Max:102.2 F (39 C)  Recent Labs  Lab 10/24/18 0512 10/24/18 1010 10/25/18 0618 10/26/18 0540 10/26/18 1401 10/26/18 1553 10/27/18 0453 10/28/18 0408 10/29/18 0543 10/29/18 0552  WBC 12.5*  --  17.5* 8.5 9.3  --   --  10.4  --   --   CREATININE 0.58  --  0.48 0.36*  --   --  0.44 0.53 0.41*  --   LATICACIDVEN 4.3* 3.0* 2.1*  --   --  1.6  --   --   --   --   VANCOTROUGH  --   --   --   --   --   --   --   --   --  8*    Estimated Creatinine Clearance: 64 mL/min (A) (by C-G formula based on SCr of 0.41 mg/dL (L)).    No Known Allergies  Antimicrobials this admission: 11/17 Cefepime >>  11/20 11/17 Ceftriaxone >> 11/20 11/20 Vancomycin >> 11/20 Zosyn>>   Dose adjustments this admission: 11/22 Vancomycin 750mg  Q12H changed to Vanc 750mg  Q8H  Microbiology results: 11/17 BCx: Strep Pneumoniae 11/17 UCx: E. Coli  11/17 Sputum/Respiratory: MSSA 11/17 MRSA PCR: Negative  Thank you for allowing pharmacy to be a part of this patient's care.  Kylie Monroe, PharmD, BCPS Clinical Pharmacist 10/29/2018 7:22 AM

## 2018-10-30 ENCOUNTER — Inpatient Hospital Stay: Payer: Self-pay

## 2018-10-30 ENCOUNTER — Inpatient Hospital Stay: Payer: Medicaid Other

## 2018-10-30 LAB — BASIC METABOLIC PANEL
Anion gap: 12 (ref 5–15)
BUN: 21 mg/dL — AB (ref 6–20)
CALCIUM: 7.4 mg/dL — AB (ref 8.9–10.3)
CO2: 33 mmol/L — AB (ref 22–32)
Chloride: 94 mmol/L — ABNORMAL LOW (ref 98–111)
Creatinine, Ser: 0.33 mg/dL — ABNORMAL LOW (ref 0.44–1.00)
GFR calc Af Amer: >60 mL/min (ref >60)
GFR calc non Af Amer: >60 mL/min (ref >60)
GLUCOSE: 143 mg/dL — AB (ref 70–99)
Potassium: 3.6 mmol/L (ref 3.5–5.1)
Sodium: 139 mmol/L (ref 135–145)

## 2018-10-30 LAB — BLOOD GAS, ARTERIAL
Acid-Base Excess: 18.9 mmol/L — ABNORMAL HIGH (ref 0.0–2.0)
Bicarbonate: 44.9 mmol/L — ABNORMAL HIGH (ref 20.0–28.0)
FIO2: 0.4
MECHANICAL RATE: 20
MECHVT: 450 mL
O2 SAT: 97.1 %
PATIENT TEMPERATURE: 37
PEEP: 5 cmH2O
PO2 ART: 82 mmHg — AB (ref 83.0–108.0)
pCO2 arterial: 55 mmHg — ABNORMAL HIGH (ref 32.0–48.0)
pH, Arterial: 7.52 — ABNORMAL HIGH (ref 7.350–7.450)

## 2018-10-30 LAB — GLUCOSE, CAPILLARY
Glucose-Capillary: 110 mg/dL — ABNORMAL HIGH (ref 70–99)
Glucose-Capillary: 111 mg/dL — ABNORMAL HIGH (ref 70–99)
Glucose-Capillary: 114 mg/dL — ABNORMAL HIGH (ref 70–99)
Glucose-Capillary: 125 mg/dL — ABNORMAL HIGH (ref 70–99)
Glucose-Capillary: 128 mg/dL — ABNORMAL HIGH (ref 70–99)
Glucose-Capillary: 131 mg/dL — ABNORMAL HIGH (ref 70–99)

## 2018-10-30 LAB — CULTURE, RESPIRATORY

## 2018-10-30 LAB — CALCIUM, IONIZED: Calcium, Ionized, Serum: 4.6 mg/dL (ref 4.5–5.6)

## 2018-10-30 LAB — CULTURE, RESPIRATORY W GRAM STAIN: Culture: NO GROWTH

## 2018-10-30 LAB — TRIGLYCERIDES: Triglycerides: 144 mg/dL (ref <150)

## 2018-10-30 LAB — VANCOMYCIN, TROUGH: VANCOMYCIN TR: 12 ug/mL — AB (ref 15–20)

## 2018-10-30 LAB — MAGNESIUM: Magnesium: 1.7 mg/dL (ref 1.7–2.4)

## 2018-10-30 LAB — PHOSPHORUS: Phosphorus: 2.5 mg/dL (ref 2.5–4.6)

## 2018-10-30 MED ORDER — SODIUM CHLORIDE 0.9% FLUSH
10.0000 mL | Freq: Two times a day (BID) | INTRAVENOUS | Status: DC
  Administered 2018-10-30: 30 mL
  Administered 2018-10-30 – 2018-11-01 (×5): 10 mL
  Administered 2018-11-02: 30 mL
  Administered 2018-11-02 – 2018-11-05 (×5): 10 mL
  Administered 2018-11-05: 30 mL
  Administered 2018-11-06: 10 mL
  Administered 2018-11-07: 20 mL

## 2018-10-30 MED ORDER — SODIUM CHLORIDE 0.9% FLUSH
10.0000 mL | INTRAVENOUS | Status: DC | PRN
  Administered 2018-11-05: 20 mL
  Administered 2018-11-05: 10 mL
  Administered 2018-11-06: 30 mL
  Filled 2018-10-30 (×3): qty 40

## 2018-10-30 NOTE — Progress Notes (Signed)
[facility] - [place] at [facility]   PATIENT NAME: Haadiya Frogge    MR#:  [id number]  DATE OF BIRTH:  1956/05/28  SUBJECTIVE:   patient on vent, FiO2 40%, bronchoscopy on 10/29/2018 Boyfriend at bedside  REVIEW OF SYSTEMS:    Unable to obtain patient intubated   Tolerating Diet:NPO  DRUG ALLERGIES:  No Known Allergies  VITALS:  Blood pressure (!) 99/56, pulse 88, temperature 98.8 F (37.1 C), temperature source Rectal, resp. rate 20, height 5\' 2"  (1.575 m), weight 56.9 kg, SpO2 95 %.  PHYSICAL EXAMINATION:  Constitutional: Appears thin intubated , not following commands. No distress. HENT: Normocephalic.  ET tube intact Eyes: Conjunctivae  are normal. PERRLA, no scleral icterus.  Neck: Normal ROM. Neck supple. No JVD CVS: RRR, S1/S2 +, no murmurs, no gallops, no carotid bruit.  Pulmonary: Effort and breath sounds normal, no stridor, decreaed entry on right side, creps present on vent support.  Abdominal: Soft. BS +,  no distension, tenderness, rebound or guarding.  Musculoskeletal:  No edema and no tenderness.  Neuro: sedated on vent. No focal deficits. Skin: Skin is warm and dry. No rash noted.  LABORATORY PANEL:   CBC Recent Labs  Lab 10/29/18 0543  WBC 7.6  HGB 8.0*  HCT 26.5*  PLT 109*   ------------------------------------------------------------------------------------------------------------------  Chemistries  Recent Labs  Lab 10/29/18 0543 10/30/18 0755  NA 146* 139  K 3.8 3.6  CL 102 94*  CO2 37* 33*  GLUCOSE 134* 143*  BUN 28* 21*  CREATININE 0.41* 0.33*  CALCIUM 7.8* 7.4*  MG 1.9 1.7  AST 43*  --   ALT 22  --   ALKPHOS 75  --   BILITOT 1.2  --    ------------------------------------------------------------------------------------------------------------------  Cardiac Enzymes Recent Labs  Lab 10/24/18 0512 10/25/18 0618  TROPONINI 0.04* 0.03*    ------------------------------------------------------------------------------------------------------------------  RADIOLOGY:  Dg Abd 1 View  Result Date: 10/30/2018 CLINICAL DATA:  NG tube placement. EXAM: ABDOMEN - 1 VIEW COMPARISON:  Recent chest radiograph FINDINGS: An NG tube is identified with tip overlying the mid stomach. Bowel gas pattern is unremarkable. Pulmonary opacities are relatively unchanged from earlier today. IMPRESSION: NG tube with tip overlying the mid stomach. Electronically Signed   By: Harmon Pier M.D.   On: 10/30/2018 10:52   Dg Chest Port 1 View  Result Date: 10/30/2018 CLINICAL DATA:  Acute respiratory failure. EXAM: PORTABLE CHEST 1 VIEW COMPARISON:  Chest radiograph 10/29/2018 FINDINGS: ET tube terminates in the distal trachea. Enteric tube courses inferior to the diaphragm. Stable cardiac and mediastinal contours. Monitoring leads overlie the patient. Similar-appearing consolidation within the right mid lower lung and left lung base. No pleural effusion or pneumothorax. IMPRESSION: Similar-appearing consolidative opacities within the right mid lower lung and left lung base. Electronically Signed   By: Annia Belt M.D.   On: 10/30/2018 07:34   Dg Chest Port 1 View  Result Date: 10/29/2018 CLINICAL DATA:  Atelectasis, intubation.  History of COPD. EXAM: PORTABLE CHEST 1 VIEW COMPARISON:  Chest radiograph October 29, 2018 FINDINGS: Endotracheal tube tip projects 16 mm above the carina. Nasogastric tube past proximal stomach, distal tip out of field of view. Diffuse interstitial prominence with similar confluent RIGHT greater than LEFT alveolar airspace opacities. Small RIGHT pleural effusion. No pneumothorax. Cardiac silhouette is mildly enlarged and unchanged. Mediastinal silhouette is not suspicious. Soft tissue planes and included osseous structures are nonacute. IMPRESSION: 1. Stable appearance of life support lines. 2. Stable interstitial and RIGHT greater LEFT  alveolar airspace opacities concerning for pneumonia and underlying COPD. Electronically Signed   By: Awilda Metroourtnay  Bloomer M.D.   On: 10/29/2018 18:48   Dg Chest Port 1 View  Result Date: 10/29/2018 CLINICAL DATA:  Hypoxia EXAM: PORTABLE CHEST 1 VIEW COMPARISON:  Chest radiograph and chest CT October 28, 2018 FINDINGS: Endotracheal tube tip is 2.3 cm above the carina. Nasogastric tube tip and side port are below the diaphragm with side port seen in the stomach. No pneumothorax. There is extensive airspace consolidation throughout much of the right lung, with involvement of most of the right middle and lower lobes as well as much of the right upper lobe. There is patchy atelectasis and mild consolidation in the left base. Left lung elsewhere clear. Heart is upper normal in size with pulmonary vascularity normal. No adenopathy. No bone lesions. IMPRESSION: Tube positions as described without pneumothorax. Extensive airspace consolidation felt to represent multifocal pneumonia throughout the right lung. Left base atelectasis and patchy consolidation. The appearance is stable compared to 1 day prior. Note that a degree of aspiration must be a differential consideration given this appearance. Stable cardiac silhouette. Electronically Signed   By: Bretta BangWilliam  Woodruff III M.D.   On: 10/29/2018 07:32   [place] Ekg Site Rite  Result Date: 10/30/2018 If Site Rite image not attached, placement could not be confirmed due to current cardiac rhythm.    ASSESSMENT AND PLAN:   62 year old female with history of COPD presented with shortness of breath.  1.  Acute hypoxic respiratory failure requiring intubation due to pneumonia and COPD exacerbation: No significant clinical improvement Status post bronchoscopy on 10/29/2018 for deep endotracheal cultures, follow-up on the cultures Vent management by intensivist.  SBAT trials per intensivist Continue steroids.  Fentanyl and propofol for sedation  2.  Streptococcus  pneumonia bacteremia with septic shock:  Clinically worsening   changed antibiotics to vancomycin and Zosyn  Diuretics for pulmonary congestion Keep map greater than 65 Blood culture Streptococcus pneumonia, follow-up on the repeat cultures from 10/27/2018 are negative  Urine culture from 10/28/2018 with no growth Endotracheal aspirate was done on 11/ 20 with no growth  CT chest. Again bronch done 10/29/18- follow cultures.  3.  Gram-negative UTI: Follow-up on urine culture E. coli sensitive to ceftriaxone and Zosyn Currently patient is on Vanco and Zosyn  4.  Acute encephalopathy with cocaine intoxication: Patient is status post lumbar puncture in the emergency room Initial CSF work-up was negative.  CSF cultures are negative discontinue ganciclovir  5.  Electrolytes: Replete as needed  6.  Thrombocytopenia from septic shock: Monitor closely.  Lovenox DC'd SCDs  7.  Urine toxicology positive for cocaine Management plans discussed with the patient's husband and he is  Poor prognosis with high mortality  CODE STATUS: Full  TOTAL TIME TAKING CARE OF THIS PATIENT: 33 minutes.   POSSIBLE D/C ?days, DEPENDING ON CLINICAL CONDITION.   Ramonita LabAruna Yordin Rhoda M.D on 10/30/2018 at 2:54 PM  Between 7am to 6pm - Pager - [phone] After 6pm go to [email or web address] - Social research officer, governmentpassword EPAS [facility]  [facility] [place] [facility]  Office  [phone]  CC: Primary care physician; Donaciano EvaWoodruff, Sarah, FNP  Note: This dictation was prepared with Dragon dictation along with smaller phrase technology. Any transcriptional errors that result from this process are unintentional.

## 2018-10-30 NOTE — Consult Note (Signed)
Pharmacy Antibiotic Note  Kylie HuntsmanSharon D Monroe is a 62 y.o. female admitted on 10/24/2018 with pneumonia.  Pharmacy has been consulted for vancomycin and zosyn dosing. Patient also has positive blood cultures (Streptococcus pneumoniae) and positive Ucx (> 100,000 E. Coli). Resp culture showing rate strep pneumo and MSSA). ID has been consulted.   Plan: 11/22 Vanc trough: 8. Level is subtherapeutic. Will change vancomycin regimen to  750 IV every 8 hours. Goal trough 15-20 mcg/mL. Calculated trough at Css 15.7. Trough level prior to 4th dose. NEW: Ke: 0.098    T1/2: 7.07  VD: 42.21  -Continue Zosyn 3.375g IV q8h (4 hour infusion).  11/23 Vanc trough= 12 mcg/ml.  Will continue current regimen on Vancomycin 750 mg IV q8h for now given increase in trough from yesterday and also on Zosyn. Will recheck trough tomorrow and adjust if needed.    Height: 5\' 2"  (157.5 cm) Weight: 125 lb 7.1 oz (56.9 kg) IBW/kg (Calculated) : 50.1  Temp (24hrs), Avg:100.9 F (38.3 C), Min:99.7 F (37.6 C), Max:101.5 F (38.6 C)  Recent Labs  Lab 10/24/18 0512 10/24/18 1010 10/25/18 0618 10/26/18 0540 10/26/18 1401 10/26/18 1553 10/27/18 0453 10/28/18 0408 10/29/18 0543 10/29/18 0552 10/30/18 0755  WBC 12.5*  --  17.5* 8.5 9.3  --   --  10.4 7.6  --   --   CREATININE 0.58  --  0.48 0.36*  --   --  0.44 0.53 0.41*  --  0.33*  LATICACIDVEN 4.3* 3.0* 2.1*  --   --  1.6  --   --   --   --   --   VANCOTROUGH  --   --   --   --   --   --   --   --   --  8* 12*    Estimated Creatinine Clearance: 59.1 mL/min (A) (by C-G formula based on SCr of 0.33 mg/dL (L)).    No Known Allergies  Antimicrobials this admission: 11/17 Cefepime >>  11/20 11/17 Ceftriaxone >> 11/20 11/20 Vancomycin >> 11/20 Zosyn>>   Dose adjustments this admission: 11/22 Vancomycin 750mg  Q12H changed to Vanc 750mg  Q8H  Microbiology results: 11/17 BCx: Strep Pneumoniae 11/17 UCx: E. Coli  11/17 Sputum/Respiratory: MSSA 11/17 MRSA PCR:  Negative  Thank you for allowing pharmacy to be a part of this patient's care.  Angelique BlonderMerrill,Tyneisha Hegeman A, PharmD, BCPS Clinical Pharmacist 10/30/2018 9:51 AM

## 2018-10-30 NOTE — Progress Notes (Signed)
Weaning as tolerated on drips and ventilator. Afebrile. PICC line placed per IV team.

## 2018-10-30 NOTE — Progress Notes (Signed)
Spoke with ID MD prior to placement, states ok to place PICC.  Myra RN aware of policy to remove 2 PIV in ipsilateral extremity due to PICC in RUE and  no need for extra PIV access.  RN to remove once tubing changed and infusing via PICC.

## 2018-10-30 NOTE — Progress Notes (Signed)
MEDICATION RELATED CONSULT NOTE    Pharmacy Consult for Electrolyte Management Indication: hypokalemia  Labs: Recent Labs    10/28/18 0408 10/29/18 0543 10/30/18 0755  WBC 10.4 7.6  --   HGB 8.0* 8.0*  --   HCT 26.4* 26.5*  --   PLT 114* 109*  --   CREATININE 0.53 0.41* 0.33*  MG 2.0 1.9 1.7  PHOS 2.0* 2.2* 2.5  ALBUMIN 2.4* 2.8*  --   PROT 5.9* 6.2*  --   AST 54* 43*  --   ALT 23 22  --   ALKPHOS 83 75  --   BILITOT 1.0 1.2  --    Potassium (mmol/L)  Date Value  10/30/2018 3.6  03/13/2014 3.7   Magnesium (mg/dL)  Date Value  16/10/960411/23/2019 1.7   Phosphorus (mg/dL)  Date Value  54/09/811911/23/2019 2.5   Calcium (mg/dL)  Date Value  14/78/295611/23/2019 7.4 (L)   Calcium, Total (mg/dL)  Date Value  21/30/865704/05/2014 8.3 (L)   Albumin (g/dL)  Date Value  84/69/629511/22/2019 2.8 (L)  03/14/2014 2.3 (L)  ] Estimated Creatinine Clearance: 59.1 mL/min (A) (by C-G formula based on SCr of 0.33 mg/dL (L)).     Assessment: Patient is a 62yo female admitted for respiratory distress. Pharmacy consulted for electrolyte management.    Goal of Therapy:  Maintain electrolytes within range  Plan:  K 3.6 Mag 1.7 Phos 2.5  Scr 0.33 Patient on Lasix 20 mg IV q12h. Will order Magnesium 1 gram IV x 1. Will recheck electrolytes with AM labs and continue to replace as needed.   Angelique BlonderMerrill,Aleatha Taite A, PharmD, BCPS Clinical Pharmacist 10/30/2018 9:28 AM

## 2018-10-30 NOTE — Consult Note (Signed)
Kylie Monroe is a 62 y.o. female with a history of HEPC, COPD, HTN was admitted from home on 11/17 with headache, confusion and incontinence. As per EMS note the patient's boyfriend had mentioned that she was confused for a few days .and In the ED she was noted to have fever. 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 CT head was unremarkable she had a LP which was normal. She had resp distress with pulse ox of 80% and was intubated in the ED. Urine tox screen positive for Cocaine. CXR showed RLL infiltrate. Pt was admitted to ICU. Blood culture came back as pneumococcus. She was started on vanco and ceftriaxone and then deescalated to ceftriaxone. She spiked another fever today and antibiotics were broadened to vanco and zosyn.  Subjective Remains intubated No fever since yesterday  Objective:  VITALS:  BP 121/69   Pulse (!) 103   Temp 98.8 F (37.1 C) (Oral)   Resp 18   Ht _0  (1.575 m)   Wt 56.9 kg   SpO2 96%   BMI 22.94 kg/m  PHYSICAL EXAM:  General: intubated and sedated Head: Normocephalic, without obvious abnormality, atraumatic. Eyes: Conjunctivae clear, anicteric sclerae. Pupils are equal ENT did not examine ET tube and NG tube Neck: did not examine Back: Did not examine Lungs: b/l air entry decreased rt side , crepts present Heart: Regular rate and rhythm, no murmur, rub or gallop. Abdomen: Soft, non-tender,not distended. Bowel sounds normal. No masses Extremities: atraumatic, no cyanosis. No edema. No clubbing Skin: No rashes or lesions. Or bruising Lymph: Cervical, supraclavicular normal. Neurologic: cannot be examined as sedated Pertinent Labs Lab Results CBC    Component Value Date/Time   WBC 7.6 10/29/2018 0543   RBC 2.81 (L) 10/29/2018 0543   HGB 8.0 (L) 10/29/2018 0543   HGB 13.7 03/13/2014 1603   HCT 26.5 (L) 10/29/2018 0543   HCT 41.7 03/13/2014 1603   PLT 109 (L) 10/29/2018 0543   PLT 100 (L)  03/13/2014 1603   MCV 94.3 10/29/2018 0543   MCV 102 (H) 03/13/2014 1603   MCH 28.5 10/29/2018 0543   MCHC 30.2 10/29/2018 0543   RDW 20.2 (H) 10/29/2018 0543   RDW 15.1 (H) 03/13/2014 1603   LYMPHSABS 0.5 (L) 10/29/2018 0543   MONOABS 0.3 10/29/2018 0543   EOSABS 0.1 10/29/2018 0543   BASOSABS 0.0 10/29/2018 0543    CMP Latest Ref Rng & Units 10/30/2018 10/29/2018 10/28/2018  Glucose 70 - 99 mg/dL 143(H) 134(H) 97  BUN 6 - 20 mg/dL 21(H) 28(H) 35(H)  Creatinine 0.44 - 1.00 mg/dL 0.33(L) 0.41(L) 0.53  Sodium 135 - 145 mmol/L 139 146(H) 146(H)  Potassium 3.5 - 5.1 mmol/L 3.6 3.8 4.3  Chloride 98 - 111 mmol/L 94(L) 102 104  CO2 22 - 32 mmol/L 33(H) 37(H) 34(H)  Calcium 8.9 - 10.3 mg/dL 7.4(L) 7.8(L) 8.0(L)  Total Protein 6.5 - 8.1 g/dL - 6.2(L) 5.9(L)  Total Bilirubin 0.3 - 1.2 mg/dL - 1.2 1.0  Alkaline Phos 38 - 126 U/L - 75 83  AST 15 - 41 U/L - 43(H) 54(H)  ALT 0 - 44 U/L - 22 23      Microbiology: Recent Results (from the past 240 hour(s))  Urine culture     Status: Abnormal   Collection Time: 10/24/18  4:56 AM  Result Value Ref Range Status   Specimen Description   Final    URINE, RANDOM Performed at  [facility], [address], [place], [place] [address]    Special Requests   Final    NONE Performed at [facility], [address] [place], [place] [address]    Culture >=100,000 COLONIES/mL ESCHERICHIA COLI (A)  Final   Report Status 10/26/2018 FINAL  Final   Organism ID, Bacteria ESCHERICHIA COLI (A)  Final      Susceptibility   Escherichia coli - MIC*    AMPICILLIN >=32 RESISTANT Resistant     CEFAZOLIN 8 SENSITIVE Sensitive     CEFTRIAXONE <=1 SENSITIVE Sensitive     CIPROFLOXACIN >=4 RESISTANT Resistant     GENTAMICIN <=1 SENSITIVE Sensitive     IMIPENEM 0.5 SENSITIVE Sensitive     NITROFURANTOIN <=16 SENSITIVE Sensitive     TRIMETH/SULFA <=20 SENSITIVE Sensitive     AMPICILLIN/SULBACTAM >=32 RESISTANT Resistant     PIP/TAZO <=4  SENSITIVE Sensitive     Extended ESBL NEGATIVE Sensitive     * >=100,000 COLONIES/mL ESCHERICHIA COLI  Culture, blood (routine x 2)     Status: Abnormal   Collection Time: 10/24/18  5:12 AM  Result Value Ref Range Status   Specimen Description   Final    BLOOD RIGHT ANTECUBITAL Performed at [facility], [address], [place], [place] [address]    Special Requests   Final    BOTTLES DRAWN AEROBIC AND ANAEROBIC Blood Culture results may not be optimal due to an excessive volume of blood received in culture bottles Performed at [facility], [address] [place], [place] [address]    Culture  Setup Time   Final    GRAM POSITIVE COCCI AEROBIC BOTTLE ONLY CRITICAL RESULT CALLED TO, READ BACK BY AND VERIFIED WITH: Winfield Rast PATEL _0  10/24/18 AKT Performed at [facility], [place] [address], [place],  [address]    Culture STREPTOCOCCUS PNEUMONIAE (A)  Final   Report Status 10/27/2018 FINAL  Final   Organism ID, Bacteria STREPTOCOCCUS PNEUMONIAE  Final      Susceptibility   Streptococcus pneumoniae - MIC*    ERYTHROMYCIN <=0.12 SENSITIVE Sensitive     LEVOFLOXACIN 0.5 SENSITIVE Sensitive     VANCOMYCIN 0.5 SENSITIVE Sensitive     PENICILLIN (non-meningitis) <=0.06 SENSITIVE Sensitive     CEFTRIAXONE (non-meningitis) <=0.12 SENSITIVE Sensitive     * STREPTOCOCCUS PNEUMONIAE  Blood Culture ID Panel (Reflexed)     Status: Abnormal   Collection Time: 10/24/18  5:12 AM  Result Value Ref Range Status   Enterococcus species NOT DETECTED NOT DETECTED Final   Listeria monocytogenes NOT DETECTED NOT DETECTED Final   Staphylococcus species NOT DETECTED NOT DETECTED Final   Staphylococcus aureus (BCID) NOT DETECTED NOT DETECTED Final   Streptococcus species DETECTED (A) NOT DETECTED Final    Comment: CRITICAL RESULT CALLED TO, READ BACK BY AND VERIFIED WITH: KISHAN PATEL _1  10/24/18 AKT    Streptococcus agalactiae NOT DETECTED NOT DETECTED Final    Streptococcus pneumoniae DETECTED (A) NOT DETECTED Final    Comment: CRITICAL RESULT CALLED TO, READ BACK BY AND VERIFIED WITH: KISHAN PATEL _2  10/24/18 AKT    Streptococcus pyogenes NOT DETECTED NOT DETECTED Final   Acinetobacter baumannii NOT DETECTED NOT DETECTED Final   Enterobacteriaceae species NOT DETECTED NOT DETECTED Final   Enterobacter cloacae complex NOT DETECTED NOT DETECTED Final   Escherichia coli NOT DETECTED NOT DETECTED Final   Klebsiella oxytoca NOT DETECTED NOT DETECTED Final   Klebsiella pneumoniae NOT DETECTED NOT DETECTED Final   Proteus  species NOT DETECTED NOT DETECTED Final   Serratia marcescens NOT DETECTED NOT DETECTED Final   Haemophilus influenzae NOT DETECTED NOT DETECTED Final   Neisseria meningitidis NOT DETECTED NOT DETECTED Final   Pseudomonas aeruginosa NOT DETECTED NOT DETECTED Final   Candida albicans NOT DETECTED NOT DETECTED Final   Candida glabrata NOT DETECTED NOT DETECTED Final   Candida krusei NOT DETECTED NOT DETECTED Final   Candida parapsilosis NOT DETECTED NOT DETECTED Final   Candida tropicalis NOT DETECTED NOT DETECTED Final    Comment: Performed at [facility], [address] [place], [place] [address]  CSF culture     Status: None   Collection Time: 10/24/18  5:54 AM  Result Value Ref Range Status   Specimen Description   Final    CSF Performed at [facility], [address], [place], [place] [address]    Special Requests   Final    NONE Performed at [facility], [place], [place] [address]    Gram Stain   Final    NO ORGANISMS SEEN RED BLOOD CELLS PRESENT WBC SEEN CYTOSPIN SMEAR    Culture   Final    NO GROWTH 3 DAYS Performed at [facility], [place] [address], [place], [place] [address]    Report Status 10/27/2018 FINAL  Final  MRSA PCR Screening     Status: None   Collection Time: 10/24/18  8:03 AM  Result Value Ref Range Status   MRSA by PCR NEGATIVE NEGATIVE  Final    Comment:        The GeneXpert MRSA Assay (FDA approved for NASAL specimens only), is one component of a comprehensive MRSA colonization surveillance program. It is not intended to diagnose MRSA infection nor to guide or monitor treatment for MRSA infections. Performed at [facility], [address] [place], [place] [address]   Expectorated sputum assessment w rflx to resp cult     Status: None   Collection Time: 10/24/18  8:39 AM  Result Value Ref Range Status   Specimen Description SPUTUM  Final   Special Requests NONE  Final   Sputum evaluation   Final    THIS SPECIMEN IS ACCEPTABLE FOR SPUTUM CULTURE ENDOTRACHEAL SPUEVA CREDITED WITH RETAINED RESULTS. [facility] 10/24/18 AT 7619 Performed at [facility], [address], [place], [place] [address]    Report Status 10/24/2018 FINAL  Final  Culture, respiratory     Status: None   Collection Time: 10/24/18  8:39 AM  Result Value Ref Range Status   Specimen Description   Final    SPUTUM Performed at [facility], [address], [place], [place] [address]    Special Requests   Final    NONE Reflexed from [id number] Performed at [facility], [address] [place], [place] [address]    Gram Stain   Final    FEW WBC PRESENT, PREDOMINANTLY PMN FEW GRAM POSITIVE COCCI Performed at [facility], [place] [address] [place] [address] [place], [place] [address]    Culture   Final    RARE STREPTOCOCCUS PNEUMONIAE RARE STAPHYLOCOCCUS AUREUS    Report Status 10/27/2018 FINAL  Final   Organism ID, Bacteria STAPHYLOCOCCUS AUREUS  Final   Organism ID, Bacteria STREPTOCOCCUS PNEUMONIAE  Final      Susceptibility   Staphylococcus aureus - MIC*    CIPROFLOXACIN <=0.5 SENSITIVE Sensitive     ERYTHROMYCIN <=0.25 SENSITIVE Sensitive     GENTAMICIN <=0.5 SENSITIVE Sensitive  OXACILLIN 0.5 SENSITIVE Sensitive     TETRACYCLINE <=1 SENSITIVE Sensitive     VANCOMYCIN <=0.5 SENSITIVE Sensitive      TRIMETH/SULFA <=10 SENSITIVE Sensitive     CLINDAMYCIN <=0.25 SENSITIVE Sensitive     RIFAMPIN <=0.5 SENSITIVE Sensitive     Inducible Clindamycin NEGATIVE Sensitive     * RARE STAPHYLOCOCCUS AUREUS   Streptococcus pneumoniae - MIC*    ERYTHROMYCIN <=0.12 SENSITIVE Sensitive     LEVOFLOXACIN 0.5 SENSITIVE Sensitive     VANCOMYCIN 0.5 SENSITIVE Sensitive     PENICILLIN (meningitis) <=0.06 SENSITIVE Sensitive     PENICILLIN (non-meningitis) <=0.06 SENSITIVE Sensitive     CEFTRIAXONE (non-meningitis) <=0.12 SENSITIVE Sensitive     CEFTRIAXONE (meningitis) <=0.12 SENSITIVE Sensitive     * RARE STREPTOCOCCUS PNEUMONIAE  CULTURE, BLOOD (ROUTINE X 2) w Reflex to ID Panel     Status: None (Preliminary result)   Collection Time: 10/27/18  1:01 PM  Result Value Ref Range Status   Specimen Description BLOOD BLOOD RIGHT HAND  Final   Special Requests   Final    BOTTLES DRAWN AEROBIC AND ANAEROBIC Blood Culture adequate volume   Culture   Final    NO GROWTH 3 DAYS Performed at [facility], [address] [place], [place] [address]    Report Status PENDING  Incomplete  CULTURE, BLOOD (ROUTINE X 2) w Reflex to ID Panel     Status: None (Preliminary result)   Collection Time: 10/27/18  2:07 PM  Result Value Ref Range Status   Specimen Description BLOOD BLOOD RIGHT HAND  Final   Special Requests   Final    BOTTLES DRAWN AEROBIC AND ANAEROBIC Blood Culture adequate volume   Culture   Final    NO GROWTH 3 DAYS Performed at [facility], [address], [place], [place] [address]    Report Status PENDING  Incomplete  Culture, respiratory (non-expectorated)     Status: None   Collection Time: 10/27/18  6:31 PM  Result Value Ref Range Status   Specimen Description   Final    TRACHEAL ASPIRATE Performed at [facility], [address], [place], [place] [address]    Special Requests   Final    NONE Performed at [facility], [address]  [place], [place] [address]    Gram Stain   Final    MODERATE WBC PRESENT, PREDOMINANTLY MONONUCLEAR NO ORGANISMS SEEN    Culture   Final    NO GROWTH Performed at [facility], [address], [place], [place] [address]    Report Status 10/30/2018 FINAL  Final  Urine Culture     Status: None   Collection Time: 10/28/18  2:07 AM  Result Value Ref Range Status   Specimen Description   Final    URINE, CATHETERIZED Performed at [facility], [address] [place], [place] [address]    Special Requests   Final    NONE Performed at [facility], [address], [place], [place] [address]    Culture   Final    NO GROWTH Performed at [facility], [place] [address], [place], [place] [address]    Report Status 10/29/2018 FINAL  Final  Culture, respiratory (non-expectorated)     Status: None (Preliminary result)   Collection Time: 10/29/18  6:01 PM  Result Value Ref Range Status   Specimen Description   Final    BRONCHIAL ALVEOLAR LAVAGE Performed at [facility], [address] [place], [place] [address]    Special Requests RIGHT  Final   Gram Stain   Final    ABUNDANT WBC PRESENT,BOTH PMN AND MONONUCLEAR NO ORGANISMS SEEN Performed at [facility], [place] [address] [place], [place] [address]    Culture PENDING  Incomplete   Report Status PENDING  Incomplete  Culture, respiratory     Status: None (Preliminary result)   Collection Time: 10/29/18  6:01 PM  Result Value Ref Range Status   Specimen Description BRONCHIAL ALVEOLAR LAVAGE  Final   Special Requests LEFT  Final   Gram Stain   Final    ABUNDANT WBC PRESENT, PREDOMINANTLY PMN NO ORGANISMS SEEN Performed at [facility], [place] [address] [place], [place] [address]    Culture PENDING  Incomplete   Report Status PENDING  Incomplete   IMAGING RESULTS: CXR CT chest reviewed personally  ? Impression/Recommendation 62 y.o. female with a history of HEPC, COPD, HTN was  admitted from home on 11/17 with headache, confusion and incontinence. As per EMS note the patient's boyfriend had mentioned that she was confused for a few days .and In the ED she was noted to have fever. 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 CT head was unremarkable she had a LP which was normal. She had resp distress with pulse ox of 80% and was intubated in the ED. Urine tox screen positive for Cocaine. CXR showed RLL infiltrate. Pt was admitted to ICU. Blood culture came back as pneumococcus. She was started on vanco and ceftriaxone and then deescalated to ceftriaxone. She spiked another fever today and antibiotics were broadened to vanco and zosyn. I am consulted for the same.  ?Strep pneumoniae bacteremia secondary to rt sided pneumonia  Rt lung Pneumonia due to pneumococcus /rare staph in the sputum- the antibiotics given for pneumococcus should cover staph as well. Repeat blood culture neg, echo no veg, Ct chest no empyema ? Acute hypoxic resp failure- due to rt sided severe pneumonia and underlying COPD -- intubated on 10/24/18 in the ED  New fever and increasing Fio2 -no fever since bronchoscopy   r/o mucous plug, fluid overload, CT chest does not show empyema   Currently ton  vanco and zosyn. BAL culture so far NG- if nt positive for GNR  Will change vanco and zosyn to ceftriaxone  echo does not show vegetations but limited aortic valve images Bronchoscopy done today. Depending on the culture will de-escalate antibiotic  E.coli in the urine  Polysubstance use- + cocaine  HEPC treated before   COPD Discussed the management with the care team

## 2018-10-30 NOTE — Progress Notes (Signed)
Peripherally Inserted Central Catheter/Midline Placement  The IV Nurse has discussed with the patient and/or persons authorized to consent for the patient, the purpose of this procedure and the potential benefits and risks involved with this procedure.  The benefits include less needle sticks, lab draws from the catheter, and the patient may be discharged home with the catheter. Risks include, but not limited to, infection, bleeding, blood clot (thrombus formation), and puncture of an artery; nerve damage and irregular heartbeat and possibility to perform a PICC exchange if needed/ordered by physician.  Alternatives to this procedure were also discussed.  Bard Power PICC patient education guide, fact sheet on infection prevention and patient information card has been provided to patient /or left at bedside.  Consent obtained via telephone from dtr by Myra RN.  S.O. At bedside with all questions answered.  Per Myra, dtr without further questions.  PICC/Midline Placement Documentation  PICC Triple Lumen 10/30/18 PICC Right Brachial 35 cm 1 cm (Active)  Indication for Insertion or Continuance of Line Vasoactive infusions;Prolonged intravenous therapies;Limited venous access - need for IV therapy >5 days (PICC only) 10/30/2018 11:27 AM  Exposed Catheter (cm) 1 cm 10/30/2018 11:27 AM  Site Assessment Clean;Dry;Intact 10/30/2018 11:27 AM  Lumen #1 Status Flushed;Saline locked;Blood return noted 10/30/2018 11:27 AM  Lumen #2 Status Flushed;Saline locked;Blood return noted 10/30/2018 11:27 AM  Lumen #3 Status Flushed;Saline locked;Blood return noted 10/30/2018 11:27 AM  Dressing Type Transparent 10/30/2018 11:27 AM  Dressing Status Clean;Dry;Intact;Antimicrobial disc in place 10/30/2018 11:27 AM  Line Care Connections checked and tightened 10/30/2018 11:27 AM  Line Adjustment (NICU/IV Team Only) No 10/30/2018 11:27 AM  Dressing Intervention New dressing 10/30/2018 11:27 AM  Dressing Change Due 11/06/18  10/30/2018 11:27 AM       Elliot Dallyiggs, Adiel Mcnamara Wright 10/30/2018, 11:28 AM

## 2018-10-30 NOTE — Progress Notes (Signed)
Name: Kylie Monroe MRN: [id number] DOB: 11-21-1956     CONSULTATION DATE: 10/24/2018 Subjective & objectives: Low-grade fever T-max 100.9.  Fentanyl + propofol + norepinephrine  PAST MEDICAL HISTORY :   has a past medical history of COPD (chronic obstructive pulmonary disease) ([place]), Hepatitis C, and Hypertension.  has a past surgical history that includes Cholecystectomy and prolapsed rectum. Prior to Admission medications   Medication Sig Start Date End Date Taking? Authorizing Provider  albuterol (PROAIR HFA) 108 (90 Base) MCG/ACT inhaler Inhale 90 mcg into the lungs as needed. 01/25/13   [provider]  aspirin 81 MG chewable tablet Chew 81 mg by mouth daily. 09/19/16   [provider]  bisacodyl (DULCOLAX) 5 MG EC tablet Take 5 mg by mouth daily. 09/25/08   [provider]  Buprenorphine HCl-Naloxone HCl (SUBOXONE) 8-2 MG FILM Take 8.5 Film by mouth 3 (three) times daily. 09/25/16   [provider]  FLOVENT HFA 220 MCG/ACT inhaler Inhale 220 mcg into the lungs 2 (two) times daily. 12/10/17   [provider]  gabapentin (NEURONTIN) 300 MG capsule Take 300 mg by mouth daily. 09/19/16   [provider]   No Known Allergies  FAMILY HISTORY:  family history includes Cancer in her mother. SOCIAL HISTORY:  reports that she has been smoking cigarettes. She has a 44.00 pack-year smoking history. She has never used smokeless tobacco. She reports that she has current or past drug history. Drug: Cocaine. She reports that she does not drink alcohol.  REVIEW OF SYSTEMS:   Unable to obtain due to critical illness   VITAL SIGNS: Temp:  [98.8 F (37.1 C)-101.3 F (38.5 C)] 98.8 F (37.1 C) (11/23 1200) Pulse Rate:  [73-100] 88 (11/23 1400) Resp:  [16-22] 20 (11/23 1400) BP: (84-115)/(47-66) 99/56 (11/23 1400) SpO2:  [95 %-99 %] 95 % (11/23 1400) FiO2 (%):  [50 %] 50 % (11/23 1300) Weight:  [56.9 kg] 56.9 kg (11/23  0500)   Physical Examination: Sedated with propofol and fentanylto RASS of -3.Followed command withtapering down sedation, however restless and the synchronizing with the ventilator On vent, no distress, bilateral equal air entry with no adventitious sounds.Minimal clear secretions S1 & S2 are audible with no murmur Benign abdominal exam with feeble peristalsis No leg edema  ASSESSMENT / PLAN:  Acute respiratory failure.P/F 205on 40% PEEP of 5 -SAT + SBT as tolerated -Monitor ABG, optimize vent settings and continuous ventilator support  Pneumonia.improvedbilateral airspace disease Rt >Lt with Pl. Eff.And pulmonary congestion.Resp. Cult -ve.MRSA PCR negative. BAL on 10/29/2018 remains -ve -Vanc + Zosyn,s/pRocephin. Monitor CXR + CBC + FiO2 -Diuresis to improve lung compliance -ID for ABX.  COPD -Optimize bronchodilators + inhaled steroids and tapering systemic steroids  UTI. E Coli -ABX as per above. Monitor urine culture  Sepsis and septic shock.Streptococcus pneumonia bacteremia.Procalcitonin 1.99 to 1.72 to 0.89. BLD, Urine and Res cult on 11/20 remains -ve -Monitorcultures, ECHO and follow with ID -Vanc + Zosyn -Optimize hydration, monitor cultures and procalcitonin  Altered mental status with cocaine intoxication.CT head negative for acute intracranial abnormalities -CSF cult -ve. S/p Acyclovir  Prerenal azotemia with mild hypernatremia (improved) -Optimize hydration, monitor renal panel and urine out put.  Hypertriglyceridemia due to propofol -Optimize propofol and monitor triglycerides  Anemia -Keep hemoglobin more than 7 g/dL  Thrombocytopenia(improved) -ResumeLovenox and monitor platelet count  Hypophosphatemia -Replete and monitor electrolytes  Full code  DVT &GI prophylaxis. Continue with supportive care.  Mr.Dalton and daughters wereupdatedat the bedside  Critical  care time 45 min

## 2018-10-30 NOTE — Plan of Care (Signed)
Patient remains intubated.  PRN med given for anxiety.  No loose stools this shift.  Patient responsive.  Following some commands. Low grade fever. Will continue to monitor.  Good urine output.

## 2018-10-30 NOTE — Progress Notes (Signed)
At bedside, noted PIV's continue to infuse medications.  All tubings changed and now infusing thru PICC line.  PIV removed per policy. Myra RN aware and retrieved new tubing for PICC.

## 2018-10-31 ENCOUNTER — Inpatient Hospital Stay: Payer: Medicaid Other

## 2018-10-31 LAB — CBC WITH DIFFERENTIAL/PLATELET
Abs Immature Granulocytes: 0.28 10*3/uL — ABNORMAL HIGH (ref 0.00–0.07)
BASOS PCT: 0 %
Basophils Absolute: 0 10*3/uL (ref 0.0–0.1)
EOS ABS: 0.1 10*3/uL (ref 0.0–0.5)
Eosinophils Relative: 1 %
HCT: 24.1 % — ABNORMAL LOW (ref 36.0–46.0)
Hemoglobin: 7.4 g/dL — ABNORMAL LOW (ref 12.0–15.0)
Immature Granulocytes: 5 %
Lymphocytes Relative: 8 %
Lymphs Abs: 0.4 10*3/uL — ABNORMAL LOW (ref 0.7–4.0)
MCH: 29.4 pg (ref 26.0–34.0)
MCHC: 30.7 g/dL (ref 30.0–36.0)
MCV: 95.6 fL (ref 80.0–100.0)
Monocytes Absolute: 0.3 10*3/uL (ref 0.1–1.0)
Monocytes Relative: 4 %
NEUTROS ABS: 4.8 10*3/uL (ref 1.7–7.7)
NRBC: 0 % (ref 0.0–0.2)
Neutrophils Relative %: 82 %
PLATELETS: 125 10*3/uL — AB (ref 150–400)
RBC: 2.52 MIL/uL — AB (ref 3.87–5.11)
RDW: 20.7 % — AB (ref 11.5–15.5)
WBC: 5.8 10*3/uL (ref 4.0–10.5)

## 2018-10-31 LAB — BLOOD GAS, ARTERIAL
ACID-BASE EXCESS: 16 mmol/L — AB (ref 0.0–2.0)
Bicarbonate: 41.2 mmol/L — ABNORMAL HIGH (ref 20.0–28.0)
FIO2: 0.4
LHR: 20 {breaths}/min
O2 SAT: 93.5 %
PEEP/CPAP: 5 cmH2O
PO2 ART: 63 mmHg — AB (ref 83.0–108.0)
Patient temperature: 37
VT: 450 mL
pCO2 arterial: 54 mmHg — ABNORMAL HIGH (ref 32.0–48.0)
pH, Arterial: 7.49 — ABNORMAL HIGH (ref 7.350–7.450)

## 2018-10-31 LAB — BASIC METABOLIC PANEL
ANION GAP: 11 (ref 5–15)
BUN: 18 mg/dL (ref 6–20)
CHLORIDE: 92 mmol/L — AB (ref 98–111)
CO2: 34 mmol/L — AB (ref 22–32)
Calcium: 7.7 mg/dL — ABNORMAL LOW (ref 8.9–10.3)
Creatinine, Ser: 0.4 mg/dL — ABNORMAL LOW (ref 0.44–1.00)
GFR calc non Af Amer: >60 mL/min (ref >60)
Glucose, Bld: 108 mg/dL — ABNORMAL HIGH (ref 70–99)
POTASSIUM: 3.4 mmol/L — AB (ref 3.5–5.1)
SODIUM: 137 mmol/L (ref 135–145)

## 2018-10-31 LAB — GLUCOSE, CAPILLARY
Glucose-Capillary: 105 mg/dL — ABNORMAL HIGH (ref 70–99)
Glucose-Capillary: 112 mg/dL — ABNORMAL HIGH (ref 70–99)
Glucose-Capillary: 113 mg/dL — ABNORMAL HIGH (ref 70–99)
Glucose-Capillary: 121 mg/dL — ABNORMAL HIGH (ref 70–99)
Glucose-Capillary: 121 mg/dL — ABNORMAL HIGH (ref 70–99)
Glucose-Capillary: 129 mg/dL — ABNORMAL HIGH (ref 70–99)
Glucose-Capillary: 149 mg/dL — ABNORMAL HIGH (ref 70–99)

## 2018-10-31 LAB — PHOSPHORUS: PHOSPHORUS: 2 mg/dL — AB (ref 2.5–4.6)

## 2018-10-31 LAB — TRIGLYCERIDES: Triglycerides: 117 mg/dL (ref <150)

## 2018-10-31 LAB — VANCOMYCIN, TROUGH: Vancomycin Tr: 13 ug/mL — ABNORMAL LOW (ref 15–20)

## 2018-10-31 LAB — MAGNESIUM: MAGNESIUM: 1.7 mg/dL (ref 1.7–2.4)

## 2018-10-31 MED ORDER — MAGNESIUM SULFATE 2 GM/50ML IV SOLN
2.0000 g | Freq: Once | INTRAVENOUS | Status: AC
  Administered 2018-10-31: 2 g via INTRAVENOUS
  Filled 2018-10-31: qty 50

## 2018-10-31 MED ORDER — SODIUM CHLORIDE 0.9 % IV SOLN
2.0000 g | INTRAVENOUS | Status: DC
  Administered 2018-10-31: 2 g via INTRAVENOUS
  Filled 2018-10-31: qty 2
  Filled 2018-10-31: qty 20

## 2018-10-31 MED ORDER — BUPRENORPHINE HCL 8 MG SL SUBL
8.0000 mg | SUBLINGUAL_TABLET | Freq: Every day | SUBLINGUAL | Status: DC
  Administered 2018-10-31 – 2018-11-03 (×4): 8 mg via SUBLINGUAL
  Filled 2018-10-31 (×4): qty 1

## 2018-10-31 MED ORDER — FUROSEMIDE 10 MG/ML IJ SOLN
20.0000 mg | Freq: Three times a day (TID) | INTRAMUSCULAR | Status: DC
  Administered 2018-10-31 – 2018-11-05 (×16): 20 mg via INTRAVENOUS
  Filled 2018-10-31 (×14): qty 2

## 2018-10-31 MED ORDER — POTASSIUM PHOSPHATES 15 MMOLE/5ML IV SOLN
20.0000 mmol | Freq: Once | INTRAVENOUS | Status: AC
  Administered 2018-10-31: 20 mmol via INTRAVENOUS
  Filled 2018-10-31: qty 6.67

## 2018-10-31 NOTE — Progress Notes (Signed)
Placed patient back into PRVC rate

## 2018-10-31 NOTE — Progress Notes (Addendum)
Name: Kylie Monroe MRN: [id number] DOB: [birth year]     CONSULTATION DATE: 10/24/2018  Subjective & objective: Propofol + fentanyl. afebrile  PAST MEDICAL HISTORY :   has a past medical history of COPD (chronic obstructive pulmonary disease) ([place]), Hepatitis C, and Hypertension.  has a past surgical history that includes Cholecystectomy and prolapsed rectum. Prior to Admission medications   Medication Sig Start Date End Date Taking? Authorizing Provider  albuterol (PROAIR HFA) 108 (90 Base) MCG/ACT inhaler Inhale 90 mcg into the lungs as needed. 01/25/13   [provider]  aspirin 81 MG chewable tablet Chew 81 mg by mouth daily. 09/19/16   [provider]  bisacodyl (DULCOLAX) 5 MG EC tablet Take 5 mg by mouth daily. 09/25/08   [provider]  Buprenorphine HCl-Naloxone HCl (SUBOXONE) 8-2 MG FILM Take 8.5 Film by mouth 3 (three) times daily. 09/25/16   [provider]  FLOVENT HFA 220 MCG/ACT inhaler Inhale 220 mcg into the lungs 2 (two) times daily. 12/10/17   [provider]  gabapentin (NEURONTIN) 300 MG capsule Take 300 mg by mouth daily. 09/19/16   [provider]   No Known Allergies  FAMILY HISTORY:  family history includes Cancer in her mother. SOCIAL HISTORY:  reports that she has been smoking cigarettes. She has a 44.00 pack-year smoking history. She has never used smokeless tobacco. She reports that she has current or past drug history. Drug: Cocaine. She reports that she does not drink alcohol.  REVIEW OF SYSTEMS:   Unable to obtain due to critical illness   VITAL SIGNS: Temp:  [98.8 F (37.1 C)-100.4 F (38 C)] 100.2 F (37.9 C) (11/24 0800) Pulse Rate:  [73-111] 96 (11/24 0800) Resp:  [15-23] 23 (11/24 0800) BP: (86-126)/(52-89) 103/58 (11/24 0800) SpO2:  [93 %-100 %] 97 % (11/24 0800) FiO2 (%):  [40 %-50 %] 40 % (11/24 0740) Weight:  [58.9 kg] 58.9 kg (11/24 0410)  Physical Examination: Sedated with  propofol and fentanylto RASS of -2.Followed command and no focal motor deficits On vent, no distress, bilateral equal air entry with no adventitious sounds.Minimal clear secretions S1 & S2 are audible with no murmur Benign abdominal exam with feeble peristalsis No leg edema  ASSESSMENT / PLAN:  Acute respiratory failure.P/F 158on 40% PEEP of 5 -Tolerating SBT, continues to have poor weaning parameter. -Monitor ABG, optimize vent settings and continuous ventilator support  Pneumonia.improvedbilateral airspace disease Rt >Lt with Pl. Eff.And pulmonary congestion.Resp. Cult -ve.MRSA PCR negative. BAL on 10/29/2018 remains -ve -Vanc + Zosyn,s/pRocephin. Monitor CXR + CBC + FiO2 -Diuresis to improve lung compliance -ID for ABX.  COPD -Optimize bronchodilators + inhaled steroids and tapering systemic steroids  UTI. E Coli -ABX as per above. Monitor urine culture  Sepsis and septic shock.Streptococcus pneumonia bacteremia.Procalcitonin 1.99 to 1.72 to 0.89. BLD, Urine and Res cult on 11/20 remains -ve -Monitorcultures, ECHO and follow with ID -Vanc + Zosyn -Optimize hydration, monitor cultures and procalcitonin  Altered mental status with cocaine intoxication.CT head negative for acute intracranial abnormalities -CSF cult -ve. S/p Acyclovir  Prerenal azotemia with mild hypernatremia (improved) -Optimize hydration, monitor renal panel and urine out put.  Hypertriglyceridemia due to propofol -Optimize propofol and monitor triglycerides  Anemia -Keep hemoglobin more than 7 g/dL  Thrombocytopenia(improved) -ResumeLovenox and monitor platelet count  Hypophosphatemia and hypokalemia -Replete and monitor electrolytes  Full code  DVT &GI prophylaxis. Continue with supportive care.  Mr.Pitera and daughters wereupdatedat the bedside  Critical care time 45 min

## 2018-10-31 NOTE — Progress Notes (Signed)
[facility] [place] [facility]   PATIENT NAME: Kylie Monroe    MR#:  [id number]  DATE OF BIRTH:  07/11/1956  SUBJECTIVE:   patient on vent, FiO2 40%, bronchoscopy on 10/29/2018 Sedation vacation and SBAT trials today Boyfriend at bedside  REVIEW OF SYSTEMS:    Unable to obtain patient intubated   Tolerating Diet:NPO  DRUG ALLERGIES:  No Known Allergies  VITALS:  Blood pressure 98/60, pulse 95, temperature 99 F (37.2 C), temperature source Bladder, resp. rate (!) 23, height 5\' 2"  (1.575 m), weight 58.9 kg, SpO2 95 %.  PHYSICAL EXAMINATION:  Constitutional: Appears thin intubated , not following commands. No distress. HENT: Normocephalic.  ET tube intact Eyes: Conjunctivae  are normal. PERRLA, no scleral icterus.  Neck: Normal ROM. Neck supple. No JVD CVS: RRR, S1/S2 +, no murmurs, no gallops, no carotid bruit.  Pulmonary: Effort and breath sounds normal, no stridor, decreaed entry on right side, creps present on vent support.  Abdominal: Soft. BS +,  no distension, tenderness, rebound or guarding.  Musculoskeletal:  No edema and no tenderness.  Neuro: sedated on vent. No focal deficits. Skin: Skin is warm and dry. No rash noted.  LABORATORY PANEL:   CBC Recent Labs  Lab 10/31/18 0356  WBC 5.8  HGB 7.4*  HCT 24.1*  PLT 125*   ------------------------------------------------------------------------------------------------------------------  Chemistries  Recent Labs  Lab 10/29/18 0543  10/31/18 0356  NA 146*   < > 137  K 3.8   < > 3.4*  CL 102   < > 92*  CO2 37*   < > 34*  GLUCOSE 134*   < > 108*  BUN 28*   < > 18  CREATININE 0.41*   < > 0.40*  CALCIUM 7.8*   < > 7.7*  MG 1.9   < > 1.7  AST 43*  --   --   ALT 22  --   --   ALKPHOS 75  --   --   BILITOT 1.2  --   --    < > = values in this interval not displayed.    ------------------------------------------------------------------------------------------------------------------  Cardiac Enzymes Recent Labs  Lab 10/25/18 0618  TROPONINI 0.03*   ------------------------------------------------------------------------------------------------------------------  RADIOLOGY:  Dg Abd 1 View  Result Date: 10/30/2018 CLINICAL DATA:  NG tube placement. EXAM: ABDOMEN - 1 VIEW COMPARISON:  Recent chest radiograph FINDINGS: An NG tube is identified with tip overlying the mid stomach. Bowel gas pattern is unremarkable. Pulmonary opacities are relatively unchanged from earlier today. IMPRESSION: NG tube with tip overlying the mid stomach. Electronically Signed   By: Harmon PierJeffrey  Hu M.D.   On: 10/30/2018 10:52   Dg Chest Port 1 View  Result Date: 10/31/2018 CLINICAL DATA:  Follow-up pneumonia. EXAM: PORTABLE CHEST 1 VIEW COMPARISON:  10/30/2018 and prior exams FINDINGS: Endotracheal tube with tip 1 cm above the carina, and NG tube entering the stomach with tip off the field of view noted. A RIGHT PICC line is now identified with tip overlying the LOWER SVC. Airspace opacities throughout the mid and LOWER RIGHT lung and LOWER LEFT lung are not significantly changed. No pneumothorax. IMPRESSION: RIGHT PICC line placement without other significant change. Bilateral airspace opacities, RIGHT greater than LEFT. Electronically Signed   By: Harmon PierJeffrey  Hu M.D.   On: 10/31/2018 08:08   Dg Chest Port 1 View  Result Date: 10/30/2018 CLINICAL DATA:  Acute respiratory failure. EXAM: PORTABLE CHEST 1 VIEW COMPARISON:  Chest radiograph 10/29/2018 FINDINGS: ET tube  terminates in the distal trachea. Enteric tube courses inferior to the diaphragm. Stable cardiac and mediastinal contours. Monitoring leads overlie the patient. Similar-appearing consolidation within the right mid lower lung and left lung base. No pleural effusion or pneumothorax. IMPRESSION: Similar-appearing consolidative  opacities within the right mid lower lung and left lung base. Electronically Signed   By: Annia Belt M.D.   On: 10/30/2018 07:34   Dg Chest Port 1 View  Result Date: 10/29/2018 CLINICAL DATA:  Atelectasis, intubation.  History of COPD. EXAM: PORTABLE CHEST 1 VIEW COMPARISON:  Chest radiograph October 29, 2018 FINDINGS: Endotracheal tube tip projects 16 mm above the carina. Nasogastric tube past proximal stomach, distal tip out of field of view. Diffuse interstitial prominence with similar confluent RIGHT greater than LEFT alveolar airspace opacities. Small RIGHT pleural effusion. No pneumothorax. Cardiac silhouette is mildly enlarged and unchanged. Mediastinal silhouette is not suspicious. Soft tissue planes and included osseous structures are nonacute. IMPRESSION: 1. Stable appearance of life support lines. 2. Stable interstitial and RIGHT greater LEFT alveolar airspace opacities concerning for pneumonia and underlying COPD. Electronically Signed   By: Awilda Metro M.D.   On: 10/29/2018 18:48   [place] Ekg Site Rite  Result Date: 10/30/2018 If Site Rite image not attached, placement could not be confirmed due to current cardiac rhythm.    ASSESSMENT AND PLAN:   62 year old female with history of COPD presented with shortness of breath.  1.  Acute hypoxic respiratory failure requiring intubation due to pneumonia and COPD exacerbation: SBT trials today, arousable on sedation vacation but continues to have poor weaning parameters  still on 40% FiO2 Status post bronchoscopy on 10/29/2018 for deep endotracheal cultures, follow-up on the cultures Vent management by intensivist.   Continue steroids.  Fentanyl and propofol for sedation  2.  Streptococcus pneumonia bacteremia with septic shock:  Clinically worsening   changed antibiotics to vancomycin and Zosyn  Diuretics for pulmonary congestion Keep map greater than 65 Blood culture Streptococcus pneumonia, follow-up on the repeat cultures  from 10/27/2018 are negative  Urine culture from 10/28/2018 with no growth Endotracheal aspirate was done on 11/ 20 with no growth  CT chest. Again bronch done 10/29/18- follow cultures.  3.  Gram-negative UTI: Follow-up on urine culture E. coli sensitive to ceftriaxone and Zosyn Currently patient is on Vanco and Zosyn  4.  Acute encephalopathy with cocaine intoxication: Patient is status post lumbar puncture in the emergency room Initial CSF work-up was negative.  CSF cultures are negative discontinue ganciclovir  5.  Electrolytes: Replete as needed  6.  Thrombocytopenia from septic shock: Monitor closely.  Lovenox DC'd SCDs  7.  Urine toxicology positive for cocaine Management plans discussed with the patient's husband and he is  Poor prognosis with high mortality  CODE STATUS: Full  TOTAL TIME TAKING CARE OF THIS PATIENT: 33 minutes.   POSSIBLE D/C ?days, DEPENDING ON CLINICAL CONDITION.   Ramonita Lab M.D on 10/31/2018 at 2:22 PM  Between 7am to 6pm - Pager - [phone] After 6pm go to [email or web address] - Social research officer, government  [facility]  Office  [phone]  CC: Primary care physician; Donaciano Eva, FNP  Note: This dictation was prepared with Dragon dictation along with smaller phrase technology. Any transcriptional errors that result from this process are unintentional.

## 2018-10-31 NOTE — Progress Notes (Signed)
Call from MD to discontinue Vancomycin and Zosyn. Begin Rocephin 2gm IV q24 hours to start 8 hours after last dose of Zosyn.

## 2018-10-31 NOTE — Progress Notes (Signed)
MEDICATION RELATED CONSULT NOTE    Pharmacy Consult for Electrolyte Management Indication: hypokalemia  Labs: Recent Labs    10/29/18 0543 10/30/18 0755 10/31/18 0356  WBC 7.6  --  5.8  HGB 8.0*  --  7.4*  HCT 26.5*  --  24.1*  PLT 109*  --  125*  CREATININE 0.41* 0.33* 0.40*  MG 1.9 1.7 1.7  PHOS 2.2* 2.5 2.0*  ALBUMIN 2.8*  --   --   PROT 6.2*  --   --   AST 43*  --   --   ALT 22  --   --   ALKPHOS 75  --   --   BILITOT 1.2  --   --    Potassium (mmol/L)  Date Value  10/31/2018 3.4 (L)  03/13/2014 3.7   Magnesium (mg/dL)  Date Value  16/10/960411/24/2019 1.7   Phosphorus (mg/dL)  Date Value  54/09/811911/24/2019 2.0 (L)   Calcium (mg/dL)  Date Value  14/78/295611/24/2019 7.7 (L)   Calcium, Total (mg/dL)  Date Value  21/30/865704/05/2014 8.3 (L)   Albumin (g/dL)  Date Value  84/69/629511/22/2019 2.8 (L)  03/14/2014 2.3 (L)  ] Estimated Creatinine Clearance: 59.1 mL/min (A) (by C-G formula based on SCr of 0.4 mg/dL (L)).     Assessment: Patient is a 62yo female admitted for respiratory distress. Pharmacy consulted for electrolyte management.    Goal of Therapy:  Maintain electrolytes within range  Plan:  K 3.4 Mag 1.7 Phos 2.0  Scr 0.40 Patient on Lasix 20 mg IV q12h.  Will order Magnesium 2 gram IV x 1. Will order Potassium Phosphate 20 mmol IV x 1 Will recheck electrolytes with AM labs and continue to replace as needed.   Angelique BlonderMerrill,Demitris Pokorny A, PharmD, BCPS Clinical Pharmacist 10/31/2018 8:46 AM

## 2018-10-31 NOTE — Progress Notes (Signed)
Patient remains intubated. Alert.  Responding to commands.  No concerns at this time. Report given to oncoming RN.

## 2018-11-01 ENCOUNTER — Inpatient Hospital Stay: Payer: Medicaid Other

## 2018-11-01 DIAGNOSIS — Z7189 Other specified counseling: Secondary | ICD-10-CM

## 2018-11-01 DIAGNOSIS — Z515 Encounter for palliative care: Secondary | ICD-10-CM

## 2018-11-01 LAB — BLOOD GAS, ARTERIAL
ACID-BASE EXCESS: 16.9 mmol/L — AB (ref 0.0–2.0)
Acid-Base Excess: 18.2 mmol/L — ABNORMAL HIGH (ref 0.0–2.0)
BICARBONATE: 42.1 mmol/L — AB (ref 20.0–28.0)
Bicarbonate: 43.7 mmol/L — ABNORMAL HIGH (ref 20.0–28.0)
FIO2: 0.4
FIO2: 40
MECHVT: 450 mL
O2 SAT: 93.4 %
O2 Saturation: 94.6 %
PATIENT TEMPERATURE: 37
PATIENT TEMPERATURE: 37
PCO2 ART: 56 mmHg — AB (ref 32.0–48.0)
PCO2 ART: 54 mmHg — AB (ref 32.0–48.0)
PEEP/CPAP: 5 cmH2O
PEEP: 5 cmH2O
PO2 ART: 67 mmHg — AB (ref 83.0–108.0)
PO2 ART: 62 mmHg — AB (ref 83.0–108.0)
Pressure support: 5 cmH2O
RATE: 20 resp/min
pH, Arterial: 7.5 — ABNORMAL HIGH (ref 7.350–7.450)
pH, Arterial: 7.5 — ABNORMAL HIGH (ref 7.350–7.450)

## 2018-11-01 LAB — COMPREHENSIVE METABOLIC PANEL
ALBUMIN: 2.2 g/dL — AB (ref 3.5–5.0)
ALT: 17 U/L (ref 0–44)
ANION GAP: 9 (ref 5–15)
AST: 28 U/L (ref 15–41)
Alkaline Phosphatase: 65 U/L (ref 38–126)
BILIRUBIN TOTAL: 0.7 mg/dL (ref 0.3–1.2)
BUN: 17 mg/dL (ref 6–20)
CHLORIDE: 90 mmol/L — AB (ref 98–111)
CO2: 37 mmol/L — ABNORMAL HIGH (ref 22–32)
Calcium: 7.6 mg/dL — ABNORMAL LOW (ref 8.9–10.3)
Creatinine, Ser: 0.33 mg/dL — ABNORMAL LOW (ref 0.44–1.00)
GFR calc Af Amer: >60 mL/min (ref >60)
GLUCOSE: 106 mg/dL — AB (ref 70–99)
POTASSIUM: 3.3 mmol/L — AB (ref 3.5–5.1)
Sodium: 136 mmol/L (ref 135–145)
TOTAL PROTEIN: 6.2 g/dL — AB (ref 6.5–8.1)

## 2018-11-01 LAB — GLUCOSE, CAPILLARY
Glucose-Capillary: 105 mg/dL — ABNORMAL HIGH (ref 70–99)
Glucose-Capillary: 118 mg/dL — ABNORMAL HIGH (ref 70–99)
Glucose-Capillary: 141 mg/dL — ABNORMAL HIGH (ref 70–99)
Glucose-Capillary: 132 mg/dL — ABNORMAL HIGH (ref 70–99)
Glucose-Capillary: 113 mg/dL — ABNORMAL HIGH (ref 70–99)
Glucose-Capillary: 108 mg/dL — ABNORMAL HIGH (ref 70–99)

## 2018-11-01 LAB — CULTURE, RESPIRATORY W GRAM STAIN: Culture: NORMAL

## 2018-11-01 LAB — CBC WITH DIFFERENTIAL/PLATELET
Abs Immature Granulocytes: 0.15 10*3/uL — ABNORMAL HIGH (ref 0.00–0.07)
BASOS ABS: 0 10*3/uL (ref 0.0–0.1)
Basophils Relative: 0 %
EOS ABS: 0.1 10*3/uL (ref 0.0–0.5)
Eosinophils Relative: 1 %
HEMATOCRIT: 22.2 % — AB (ref 36.0–46.0)
Hemoglobin: 7 g/dL — ABNORMAL LOW (ref 12.0–15.0)
IMMATURE GRANULOCYTES: 2 %
Lymphocytes Relative: 5 %
Lymphs Abs: 0.4 10*3/uL — ABNORMAL LOW (ref 0.7–4.0)
MCH: 29.7 pg (ref 26.0–34.0)
MCHC: 31.5 g/dL (ref 30.0–36.0)
MCV: 94.1 fL (ref 80.0–100.0)
MONOS PCT: 4 %
Monocytes Absolute: 0.3 10*3/uL (ref 0.1–1.0)
NEUTROS PCT: 88 %
NRBC: 0 % (ref 0.0–0.2)
Neutro Abs: 6 10*3/uL (ref 1.7–7.7)
PLATELETS: 139 10*3/uL — AB (ref 150–400)
RBC: 2.36 MIL/uL — ABNORMAL LOW (ref 3.87–5.11)
RDW: 21.5 % — ABNORMAL HIGH (ref 11.5–15.5)
WBC: 6.8 10*3/uL (ref 4.0–10.5)

## 2018-11-01 LAB — MAGNESIUM: Magnesium: 1.9 mg/dL (ref 1.7–2.4)

## 2018-11-01 LAB — CULTURE, BLOOD (ROUTINE X 2)
CULTURE: NO GROWTH
Culture: NO GROWTH
SPECIAL REQUESTS: ADEQUATE
Special Requests: ADEQUATE

## 2018-11-01 LAB — CULTURE, RESPIRATORY: CULTURE: NORMAL

## 2018-11-01 LAB — TRIGLYCERIDES: Triglycerides: 95 mg/dL (ref <150)

## 2018-11-01 LAB — PHOSPHORUS: Phosphorus: 2.6 mg/dL (ref 2.5–4.6)

## 2018-11-01 MED ORDER — ALBUMIN HUMAN 25 % IV SOLN
25.0000 g | Freq: Once | INTRAVENOUS | Status: AC
  Administered 2018-11-01: 25 g via INTRAVENOUS
  Filled 2018-11-01: qty 100

## 2018-11-01 MED ORDER — SODIUM CHLORIDE 0.9 % IV SOLN
2.0000 g | INTRAVENOUS | Status: DC
  Administered 2018-11-01 – 2018-11-02 (×3): 2 g via INTRAVENOUS
  Filled 2018-11-01 (×2): qty 2
  Filled 2018-11-01: qty 20

## 2018-11-01 MED ORDER — POTASSIUM CHLORIDE 20 MEQ PO PACK
40.0000 meq | PACK | ORAL | Status: AC
  Administered 2018-11-01 (×2): 40 meq
  Filled 2018-11-01 (×2): qty 2

## 2018-11-01 MED ORDER — BISACODYL 5 MG PO TBEC
5.0000 mg | DELAYED_RELEASE_TABLET | Freq: Every day | ORAL | Status: DC | PRN
  Filled 2018-11-01: qty 1

## 2018-11-01 MED ORDER — MAGNESIUM SULFATE IN D5W 1-5 GM/100ML-% IV SOLN
1.0000 g | Freq: Once | INTRAVENOUS | Status: AC
  Administered 2018-11-01: 1 g via INTRAVENOUS
  Filled 2018-11-01: qty 100

## 2018-11-01 MED ORDER — JUVEN PO PACK
1.0000 | PACK | Freq: Two times a day (BID) | ORAL | Status: DC
  Administered 2018-11-01 – 2018-11-02 (×2): 1

## 2018-11-01 NOTE — Progress Notes (Signed)
Nutrition Follow-up  DOCUMENTATION CODES:   Non-severe (moderate) malnutrition in context of chronic illness  INTERVENTION:  Continue Vital AF 1.2 at 50 mL/hr (1200 mL goal daily volume) per OGT. Provides 1440 kcal, 90 grams of protein, 972 mL H2O daily. With current propofol rate provides 1572 kcal daily.  With free water flush of 100 mL Q6hrs patient receiving a total of 1372 mL H2O daily.  Continue liquid MVI daily per tube.   Provide Juven BID per tube, each supplement provides 90-95 kcal, 14 grams amino acids, and vitamins/minerals essential for wound healing.  NUTRITION DIAGNOSIS:   Moderate Malnutrition related to chronic illness(COPD, hepatitis C) as evidenced by moderate fat depletion, moderate muscle depletion.  Ongoing - addressing with TF regimen.  GOAL:   Provide needs based on ASPEN/SCCM guidelines  Met with TF regimen.  MONITOR:   Vent status, Labs, Weight trends, TF tolerance, I & O's  REASON FOR ASSESSMENT:   Ventilator    ASSESSMENT:   62 year old female with PMHx of hepatitis C, HTN, COPD, hx cholecystectomy admitted with respiratory failure requiring intubation on 11/17, PNA, UTI, septic shock, AMS with cocaine intoxication.  Patient remains intubated and sedated. On PRVC mode with FiO2 40% and PEEP 5 cmH2O. Patient has been failing SBTs. Abdomen is distended but not taut. Last BM was 11/22.  Enteral Access: OGT replaced on 11/24; enters stomach per chest x-ray 11/24; 55 cm at corner of mouth  MAP: 63-92 mmHg  TF: pt has been tolerating Vital AF 1.2 at 50 mL/hr  Patient is currently intubated on ventilator support Ve: 9.8 L/min Temp (24hrs), Avg:99.4 F (37.4 C), Min:98.4 F (36.9 C), Max:100 F (37.8 C)  Propofol: 5 mL/hr (132 kcal daily)  Medications reviewed and include: Xanax 0.5 mg BID, Colace 100 mg daily, famotidine, free water flush 100 mL Q6hrs, Lasix 20 mg Q8hrs IV, liquid MVI daily per tube, Senokot 5 mL BID per tube,  ceftriaxone, fentanyl gtt, propofol gtt.  Labs reviewed: CBG 105-141, Potassium 3.3, Chloride 90, CO2 37, Creatinine 0.33.  I/O: 3650 mL UOP yesterday (2.6 mL/kg/hr)  Weight trend: 58.9 kg on 11/24; +3.2 kg from admission  Discussed with RN and on rounds. Patient now has stage II pressure ulcer.  Diet Order:   Diet Order            Diet NPO time specified  Diet effective now             EDUCATION NEEDS:   No education needs have been identified at this time  Skin:  Skin Assessment: Skin Integrity Issues: Skin Integrity Issues:: Stage II Stage II: left buttocks  Last BM:  10/29/2018 - large type 7  Height:   Ht Readings from Last 1 Encounters:  10/24/18 _0  (1.575 m)   Weight:   Wt Readings from Last 1 Encounters:  10/31/18 58.9 kg   Ideal Body Weight:  50 kg  BMI:  Body mass index is 23.75 kg/m.  Estimated Nutritional Needs:   Kcal:  1570 (PSU 2003b w/ MSJ 1099)  Protein:  80-95 grams (1.4-1.7 grams/kg)  Fluid:  1.4-1.7 L/day (25-30 mL/kg)  Willey Blade, MS, RD, LDN Office: [phone] Pager: [phone] After Hours/Weekend Pager: [phone]

## 2018-11-01 NOTE — Progress Notes (Signed)
1100 Wake up assessment done. Patient had a large stool during weaning and spontaneous mode on ventilator. Significant other got very upset with nursing staff because patient had to wait 15 minutes before she was cleaned up. Clean up of stool took approximately 1 hour with a bed change x 2. Patient coughed continuously and had to be sedated with Fentanyl and Propofol restarted. Patient calmed down quickly. Significant other blamed delay in stool clean for being unable to extubate patient. Dr. Duanne LimerickSamaan in to talk with significant other to explain low oxygen concentration not delay in stool clean up was cause of failure to extubate.

## 2018-11-01 NOTE — Consult Note (Signed)
Pharmacy Antibiotic Note  Kylie HuntsmanSharon D Monroe is a 62 y.o. female admitted on 10/24/2018 with pneumonia.  Pharmacy has been consulted for vancomycin and zosyn dosing. Patient also has positive blood cultures (Streptococcus pneumoniae) and positive Ucx (> 100,000 E. Coli). Resp culture showing rate strep pneumo and MSSA). ID has been consulted.   Plan: Per ID consult, vancomycin/Zosyn were discontinued (cultures growing strep pneumo) and switched to ceftriaxone on 11/24.  Continue ceftriaxone 2 g IV q24h.  Height: 5\' 2"  (157.5 cm) Weight: 129 lb 13.6 oz (58.9 kg) IBW/kg (Calculated) : 50.1  Temp (24hrs), Avg:99.2 F (37.3 C), Min:98.4 F (36.9 C), Max:100 F (37.8 C)  Recent Labs  Lab 10/26/18 1401 10/26/18 1553  10/28/18 0408 10/29/18 0543  10/30/18 0755 10/31/18 0356 10/31/18 1500 11/01/18 0449  WBC 9.3  --   --  10.4 7.6  --   --  5.8  --  6.8  CREATININE  --   --    < > 0.53 0.41*  --  0.33* 0.40*  --  0.33*  LATICACIDVEN  --  1.6  --   --   --   --   --   --   --   --   VANCOTROUGH  --   --   --   --   --    < > 12*  --  13*  --    < > = values in this interval not displayed.    Estimated Creatinine Clearance: 59.1 mL/min (A) (by C-G formula based on SCr of 0.33 mg/dL (L)).    No Known Allergies  Antimicrobials this admission: 11/17 Cefepime >>  11/20 11/17 Ceftriaxone >> 11/20 11/24          "          >> 11/20 Vancomycin >> 11/24 11/20 Zosyn >> 11/25   Dose adjustments this admission: 11/22 Vancomycin 750mg  Q12H changed to Vanc 750mg  Q8H  Microbiology results: 11/22 Resp Cx x3: negative  11/20 BCx x2: negative  11/17 BCx: Strep Pneumoniae (sensitive to CTX) 11/17 UCx: E. Coli (pansensitive)  11/17 Sputum/Respiratory: MSSA 11/17 MRSA PCR: Negative  Thank you for allowing pharmacy to be a part of this patient's care.  Broadus Johnhaeyeong Pryor Guettler, PharmD candidate  11/01/2018 11:40 AM

## 2018-11-01 NOTE — Progress Notes (Signed)
Rough day.Failed weaning. Very agitated until Propofol restarted. Rested this afternoon with oxygen saturations in the high 90s.. Deaf daughter updated x 2 with interpreter. Output remains good. BM today.

## 2018-11-01 NOTE — Consult Note (Signed)
Consultation Note Date: 11/01/2018   Patient Name: Kylie Monroe  DOB: 1955/12/31  MRN: [id number]  Age / Sex: 62 y.o., female  PCP: Maryanna Shape, [place] Referring Physician: Epifanio Lesches, MD  Reason for Consultation: Establishing goals of care and Psychosocial/spiritual support  HPI/Patient Profile: 62 y.o. female  admitted on 10/24/2018 with PMH significant  for  HEPC, COPD, HTN was admitted from home on 11/17 with headache, confusion and incontinence.  According to significant other she was confused for a few days, in  the ED she was noted to have fever. She was confused and lethargic.  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 Urine tox screen positive for Cocaine.  Her husband tells me today "now I have to get her off of this".  He speaks to his efforts in helping the patient stop utilization of her heroin and alcohol in the past.   CT head was unremarkable she had a LP which was normal. She had resp distress with pulse ox of 80% and was intubated in the ED.   CXR showed RLL infiltrate. Pt was admitted to ICU. Blood culture came back as pneumococcus. She was started on vanco and ceftriaxone and then deescalated to ceftriaxone.    Patient remains intubated.  Family face treatment option decisions, advanced directive decisions and anticipatory care needs.   Clinical Assessment and Goals of Care:  This NP Wadie Lessen reviewed medical records, received report from team, assessed the patient and then meet at the patient's bedside along with her SO/thirty years/ [name]  to discuss diagnosis, prognosis, GOC, EOL wishes disposition and options.  I then placed a call to her daughter [name] to again discuss current medical situation/ as above  Concept of  Palliative Care was discussed  A detailed discussion was had today regarding  advanced directives.  Concepts specific to code status, artifical feeding and hydration, continued IV antibiotics and rehospitalization was had.  The difference between a aggressive medical intervention path  and a palliative comfort care path for this patient at this time was had.  Values and goals of care important to patient and family were attempted to be elicited.  Questions and concerns addressed.   Family encouraged to call with questions or concerns.    PMT will continue to support holistically.   NEXT OF KIN   There is no documented healthcare power of attorney or advanced directive.  The daughter [name] tells me that her significant other/ Mr. [name] and herself will work together to make medical decisions for the patient if she cannot make those decisions for herself.  Her daughter/ Quentin Mulling noted in chart is known to have significant substance abuse issues and is estranged from the patient    [place]:  Full code   Palliative Prophylaxis:   Aspiration, Bowel Regimen, Delirium Protocol, Frequent Pain Assessment and Oral Care  Additional Recommendations (Limitations, Scope, Preferences):  Full Scope Treatment- family is hopeful for return to baseline  Psycho-social/Spiritual:   Desire for further Chaplaincy support:no   Additional Recommendations: Created space and opportunity for Mr. Kylie Ill to explore his thoughts and feelings regarding the current medical situation.  Prognosis:   Unable to determine  Discharge Planning:  Discussed the likelihood that even the best case scenario;  the patient is going to need some transition of care before returning home, possibly a skilled nursing facility for short-term rehab   To Be Determined      Primary Diagnoses: Present on Admission: **None**   I have reviewed the medical record, interviewed the patient and family, and examined the patient. The following  aspects are pertinent.  Past Medical History:  Diagnosis Date  . COPD (chronic obstructive pulmonary disease) ([place])   . Hepatitis C   . Hypertension    Social History   Socioeconomic History  . Marital status: Divorced    Spouse name: Not on file  . Number of children: Not on file  . Years of education: Not on file  . Highest education level: Not on file  Occupational History  . Not on file  Social Needs  . Financial resource strain: Not on file  . Food insecurity:    Worry: Not on file    Inability: Not on file  . Transportation needs:    Medical: Not on file    Non-medical: Not on file  Tobacco Use  . Smoking status: Current Every Day Smoker    Packs/day: 1.00    Years: 44.00    Pack years: 44.00    Types: Cigarettes  . Smokeless tobacco: Never Used  Substance and Sexual Activity  . Alcohol use: No  . Drug use: Yes    Types: Cocaine    Comment: valium  . Sexual activity: Not on file  Lifestyle  . Physical activity:    Days per week: Not on file    Minutes per session: Not on file  . Stress: Not on file  Relationships  . Social connections:    Talks on phone: Not on file    Gets together: Not on file    Attends religious service: Not on file    Active member of club or organization: Not on file    Attends meetings of clubs or organizations: Not on file    Relationship status: Not on file  Other Topics Concern  . Not on file  Social History Narrative  . Not on file   Family History  Problem Relation Age of Onset  . Cancer Mother    Scheduled Meds: . ALPRAZolam  0.5 mg Oral BID  . budesonide (PULMICORT) nebulizer solution  0.5 mg Nebulization BID  . buprenorphine  8 mg Sublingual Daily  . chlorhexidine gluconate (MEDLINE KIT)  15 mL Mouth Rinse BID  . docusate  100 mg Oral Daily  . enoxaparin (LOVENOX) injection  40 mg Subcutaneous Q24H  . famotidine  20 mg Per Tube Daily  . free water  100 mL Per Tube Q6H  . furosemide  20 mg Intravenous Q8H  .  gabapentin  300 mg Oral Daily  . ipratropium-albuterol  3 mL Nebulization Q4H  . mouth rinse  15 mL Mouth Rinse 10 times per day  . multivitamin  15 mL Per Tube Daily  . potassium chloride  40 mEq Per Tube Q4H  . sennosides  5 mL Per Tube BID  . sodium chloride flush  10-40 mL Intracatheter Q12H  Continuous Infusions: . sodium chloride Stopped (10/27/18 1322)  . albumin human    . cefTRIAXone (ROCEPHIN)  IV    . feeding supplement (VITAL AF 1.2 CAL) 50 mL/hr at 11/01/18 0600  . fentaNYL infusion INTRAVENOUS 200 mcg/hr (11/01/18 0700)  . norepinephrine (LEVOPHED) Adult infusion Stopped (10/30/18 1814)  . propofol (DIPRIVAN) infusion 20 mcg/kg/min (11/01/18 1157)  . vasopressin (PITRESSIN) infusion - *FOR SHOCK* Stopped (10/24/18 0727)   PRN Meds:.acetaminophen **OR** acetaminophen, albuterol, LORazepam, ondansetron **OR** ondansetron (ZOFRAN) IV, sodium chloride flush Medications Prior to Admission:  Prior to Admission medications   Medication Sig Start Date End Date Taking? Authorizing Provider  albuterol (PROAIR HFA) 108 (90 Base) MCG/ACT inhaler Inhale 90 mcg into the lungs as needed. 01/25/13   [provider]  aspirin 81 MG chewable tablet Chew 81 mg by mouth daily. 09/19/16   [provider]  bisacodyl (DULCOLAX) 5 MG EC tablet Take 5 mg by mouth daily. 09/25/08   [provider]  Buprenorphine HCl-Naloxone HCl (SUBOXONE) 8-2 MG FILM Take 8.5 Film by mouth 3 (three) times daily. 09/25/16   [provider]  FLOVENT HFA 220 MCG/ACT inhaler Inhale 220 mcg into the lungs 2 (two) times daily. 12/10/17   [provider]  gabapentin (NEURONTIN) 300 MG capsule Take 300 mg by mouth daily. 09/19/16   [provider]   No Known Allergies Review of Systems  Unable to perform ROS: Intubated    Physical Exam  Constitutional: She appears cachectic. She appears ill. She is intubated.  Cardiovascular: Tachycardia present.  Pulmonary/Chest:  She is intubated.  Skin: Skin is warm and dry.    Vital Signs: BP 130/77   Pulse (!) 110   Temp 100 F (37.8 C) (Bladder)   Resp (!) 23   Ht 5' 2"  (1.575 m)   Wt 58.9 kg   SpO2 95%   BMI 23.75 kg/m  Pain Scale: CPOT   Pain Score: 0-No pain   SpO2: SpO2: 95 % O2 Device:SpO2: 95 % O2 Flow Rate: .O2 Flow Rate (L/min): 2 L/min  IO: Intake/output summary:   Intake/Output Summary (Last 24 hours) at 11/01/2018 1158 Last data filed at 11/01/2018 6389 Gross per 24 hour  Intake 3186.27 ml  Output 2650 ml  Net 536.27 ml    LBM: Last BM Date: 10/30/18 Baseline Weight: Weight: 55.9 kg Most recent weight: Weight: 58.9 kg     Palliative Assessment/Data: 30%    Discussed with Dr Vianne Bulls  Time In: 1000 Time Out: 1115 Time Total: 75 minutes Greater than 50%  of this time was spent counseling and coordinating care related to the above assessment and plan.  Signed by: Wadie Lessen, NP   Please contact Palliative Medicine Team phone at [phone] for questions and concerns.  For individual provider: See Shea Evans

## 2018-11-01 NOTE — Progress Notes (Signed)
[employer] [facility] [facility] [place] [facility]   PATIENT NAME: Kylie LeydenSharon Wadel    MR#:  [id number]  DATE OF BIRTH:  60/02/1956  SUBJECTIVE:   patient on vent, FiO2 40%, bronchoscopy on 10/29/2018 Patient is on sedation vacation for the last 3 days but unable to extubate.  Spoke with ICU attending today.  May need trach.  REVIEW OF SYSTEMS:    Unable to obtain patient intubated   Tolerating Diet:NPO  DRUG ALLERGIES:  No Known Allergies  VITALS:  Blood pressure 130/77, pulse (!) 110, temperature 100 F (37.8 C), temperature source Bladder, resp. rate (!) 23, height 5\' 2"  (1.575 m), weight 58.9 kg, SpO2 95 %.  PHYSICAL EXAMINATION:  Constitutional: Appears thin intubated , awake sedation being weaned off hENT: Normocephalic.  ET tube intact Eyes: Conjunctivae  are normal. PERRLA, no scleral icterus.  Neck: Normal ROM. Neck supple. No JVD CVS: RRR, S1/S2 +, no murmurs, no gallops, no carotid bruit.  Pulmonary: Effort and breath sounds normal, no stridor, decreaed entry on right side, creps present on vent support.  Abdominal: Soft. BS +,  no distension, tenderness, rebound or guarding.  Musculoskeletal:  No edema and no tenderness.  Neuro: Full vent, no focal neuro deficits observed. Skin: Skin is warm and dry. No rash noted.  LABORATORY PANEL:   CBC Recent Labs  Lab 11/01/18 0449  WBC 6.8  HGB 7.0*  HCT 22.2*  PLT 139*   ------------------------------------------------------------------------------------------------------------------  Chemistries  Recent Labs  Lab 11/01/18 0449  NA 136  K 3.3*  CL 90*  CO2 37*  GLUCOSE 106*  BUN 17  CREATININE 0.33*  CALCIUM 7.6*  MG 1.9  AST 28  ALT 17  ALKPHOS 65  BILITOT 0.7   ------------------------------------------------------------------------------------------------------------------  Cardiac Enzymes No results for input(s): TROPONINI in the last 168  hours. ------------------------------------------------------------------------------------------------------------------  RADIOLOGY:  Dg Abd 1 View  Result Date: 10/30/2018 CLINICAL DATA:  NG tube placement. EXAM: ABDOMEN - 1 VIEW COMPARISON:  Recent chest radiograph FINDINGS: An NG tube is identified with tip overlying the mid stomach. Bowel gas pattern is unremarkable. Pulmonary opacities are relatively unchanged from earlier today. IMPRESSION: NG tube with tip overlying the mid stomach. Electronically Signed   By: Harmon PierJeffrey  Hu M.D.   On: 10/30/2018 10:52   Dg Chest Port 1 View  Result Date: 11/01/2018 CLINICAL DATA:  Pneumonia EXAM: PORTABLE CHEST 1 VIEW COMPARISON:  10/31/2018 FINDINGS: Dense consolidation throughout much of the right lung, unchanged. Left lower lobe airspace opacity also unchanged. Cardiomegaly. No visible effusions. Support devices are unchanged. IMPRESSION: Stable diffuse right lung and left lower lobe airspace opacities. Cardiomegaly. Electronically Signed   By: Charlett NoseKevin  Dover M.D.   On: 11/01/2018 08:58   Dg Chest Port 1 View  Result Date: 10/31/2018 CLINICAL DATA:  Follow-up pneumonia. EXAM: PORTABLE CHEST 1 VIEW COMPARISON:  10/30/2018 and prior exams FINDINGS: Endotracheal tube with tip 1 cm above the carina, and NG tube entering the stomach with tip off the field of view noted. A RIGHT PICC line is now identified with tip overlying the LOWER SVC. Airspace opacities throughout the mid and LOWER RIGHT lung and LOWER LEFT lung are not significantly changed. No pneumothorax. IMPRESSION: RIGHT PICC line placement without other significant change. Bilateral airspace opacities, RIGHT greater than LEFT. Electronically Signed   By: Harmon PierJeffrey  Hu M.D.   On: 10/31/2018 08:08     ASSESSMENT AND PLAN:   62 year old female with history of COPD presented with shortness of breath.  1.  Acute hypoxic respiratory failure requiring intubation due to pneumonia and COPD  exacerbation: SBT trials today, arousable on sedation vacation but continues to have poor weaning parameters  still on 40% FiO2 Status post bronchoscopy on 10/29/2018 for deep endotracheal cultures, so far no growth. Vent management by intensivist.   Continue steroids.  Fentanyl and propofol for sedation  2.  Streptococcus pneumonia bacteremia with septic shock:  Clinically worsening   changed antibiotics to vancomycin and Zosyn  Diuretics for pulmonary congestion Keep map greater than 65 Blood culture Streptococcus pneumonia, follow-up on the repeat cultures from 10/27/2018 are negative  Urine culture from 10/28/2018 with no growth Endotracheal aspirate was done on 11/ 20 with no growth  CT chest. Again bronch done 10/29/18- follow cultures.  3.  Gram-negative UTI: Follow-up on urine culture E. coli sensitive to ceftriaxone and Zosyn Currently patient is on Vanco and Zosyn  4.  Acute encephalopathy with cocaine intoxication: Patient is status post lumbar puncture in the emergency room Initial CSF work-up was negative.  CSF cultures are negative discontinue ganciclovir  5.  Electrolytes: Replete as needed  6.  Thrombocytopenia from septic shock: Monitor closely.  Lovenox DC'd SCDs  7.  Urine toxicology positive for cocaine Management plans discussed with the patient's husband and he is  Poor prognosis with high mortality  CODE STATUS: Full  TOTAL TIME TAKING CARE OF THIS PATIENT: 33 minutes.   POSSIBLE D/C ?days, DEPENDING ON CLINICAL CONDITION.   Katha Hamming M.D on 11/01/2018 at 9:40 AM  Between 7am to 6pm - Pager - [phone] After 6pm go to [email or web address] - Social research officer, government  [facility]  Office  [phone]  CC: Primary care physician; Donaciano Eva, FNP  Note: This dictation was prepared with Dragon dictation along with smaller phrase technology. Any transcriptional errors that result from this process are unintentional.

## 2018-11-01 NOTE — Progress Notes (Signed)
Name: SPARROW SIRACUSA MRN: [id number] DOB: [birth year]     CONSULTATION DATE: 10/24/2018  Subjective & objectives: Fentanyl drip for sedation.  Afebrile  PAST MEDICAL HISTORY :   has a past medical history of COPD (chronic obstructive pulmonary disease) ([place]), Hepatitis C, and Hypertension.  has a past surgical history that includes Cholecystectomy and prolapsed rectum. Prior to Admission medications   Medication Sig Start Date End Date Taking? Authorizing Provider  albuterol (PROAIR HFA) 108 (90 Base) MCG/ACT inhaler Inhale 90 mcg into the lungs as needed. 01/25/13   [provider]  aspirin 81 MG chewable tablet Chew 81 mg by mouth daily. 09/19/16   [provider]  bisacodyl (DULCOLAX) 5 MG EC tablet Take 5 mg by mouth daily. 09/25/08   [provider]  Buprenorphine HCl-Naloxone HCl (SUBOXONE) 8-2 MG FILM Take 8.5 Film by mouth 3 (three) times daily. 09/25/16   [provider]  FLOVENT HFA 220 MCG/ACT inhaler Inhale 220 mcg into the lungs 2 (two) times daily. 12/10/17   [provider]  gabapentin (NEURONTIN) 300 MG capsule Take 300 mg by mouth daily. 09/19/16   [provider]   No Known Allergies  FAMILY HISTORY:  family history includes Cancer in her mother. SOCIAL HISTORY:  reports that she has been smoking cigarettes. She has a 44.00 pack-year smoking history. She has never used smokeless tobacco. She reports that she has current or past drug history. Drug: Cocaine. She reports that she does not drink alcohol.  REVIEW OF SYSTEMS:   Unable to obtain due to critical illness   VITAL SIGNS: Temp:  [98.4 F (36.9 C)-100 F (37.8 C)] 100 F (37.8 C) (11/25 0800) Pulse Rate:  [87-125] 110 (11/25 0900) Resp:  [16-27] 23 (11/25 0900) BP: (91-155)/(47-89) 130/77 (11/25 0900) SpO2:  [92 %-99 %] 95 % (11/25 0900) FiO2 (%):  [40 %] 40 % (11/25 0715)   Physical Examination: Sedated with fentanylto RASS of -1.Followed  command and no focal motor deficits On vent, no distress, bilateral equal air entry with no adventitious sounds.Minimal clear secretions S1 & S2 are audible with no murmur Benign abdominal exam with feeble peristalsis No leg edema  ASSESSMENT / PLAN:  Ventilator dependent respiratory failure.P/F168on 40% PEEP of 5 -Tolerating SBT, continues to have poor weaning parameter. -Monitor ABG, optimize vent settings and continuous ventilator support -Consider trach and PEG if continues to have poor weaning parameters  Pneumonia.improvedbilateral airspace disease Rt >Lt with Pl. Eff.And pulmonary congestion.Resp. Cult -ve.MRSA PCR negative. BAL on 10/29/2018 remains -ve -Vanc + Zosyn,s/pRocephin. Monitor CXR + CBC + FiO2 -Diuresis to improve lung compliance -ID for ABX.  COPD -Optimize bronchodilators + inhaled steroids and tapering systemic steroids  UTI. E Coli -ABX as per above. Monitor urine culture  Sepsisand septic shock. Improved and off pressers. Streptococcus pneumonia bacteremia.Procalcitonin 1.99 to 1.72to 0.89. BLD, Urineand Res cult on 11/20 remains -ve Echocardiogram 10/28/2018 LVEF 60 to 65%, no regional wall abnormalities and no reported vegetations. Rocephin, s/p  Vanc + Zosyn as per ID   Altered mental status with cocaine intoxication.CT head negative for acute intracranial abnormalities -CSF cult -ve. S/p Acyclovir  Prerenal azotemia with mild hypernatremia (improved) -Optimize hydration, monitor renal panel and urine out put.  Hypertriglyceridemia due to propofol -Optimize propofol and monitor triglycerides  Anemia -Keep hemoglobin more than 7 g/dL  Thrombocytopenia(improved) -ResumeLovenox and monitor platelet count  Hypokalemia -Replete and monitor electrolytes  Full code  DVT &GI prophylaxis. Continue with supportive care.  Mr.Andre and  daughters wereupdatedat the bedside  Critical care time 45  min

## 2018-11-01 NOTE — Progress Notes (Signed)
MEDICATION RELATED CONSULT NOTE    Pharmacy Consult for Electrolyte Management Indication: hypokalemia  Labs: Recent Labs    10/30/18 0755 10/31/18 0356 11/01/18 0449  WBC  --  5.8 6.8  HGB  --  7.4* 7.0*  HCT  --  24.1* 22.2*  PLT  --  125* 139*  CREATININE 0.33* 0.40* 0.33*  MG 1.7 1.7 1.9  PHOS 2.5 2.0* 2.6  ALBUMIN  --   --  2.2*  PROT  --   --  6.2*  AST  --   --  28  ALT  --   --  17  ALKPHOS  --   --  65  BILITOT  --   --  0.7   Potassium (mmol/L)  Date Value  11/01/2018 3.3 (L)  03/13/2014 3.7   Magnesium (mg/dL)  Date Value  21/30/865711/25/2019 1.9   Phosphorus (mg/dL)  Date Value  84/69/629511/25/2019 2.6   Calcium (mg/dL)  Date Value  28/41/324411/25/2019 7.6 (L)   Calcium, Total (mg/dL)  Date Value  01/02/725304/05/2014 8.3 (L)   Albumin (g/dL)  Date Value  66/44/034711/25/2019 2.2 (L)  03/14/2014 2.3 (L)   Estimated Creatinine Clearance: 59.1 mL/min (A) (by C-G formula based on SCr of 0.33 mg/dL (L)).   Assessment: Patient is a 62yo female admitted for respiratory distress. Pharmacy consulted for electrolyte management.   Goal of Therapy:  Replace electrolytes with goals: K+ ~4, Mg ~2   Plan:  Patient's potassium was 3.3, magnesium 1.9 this morning. Patient ordered to receive magnesium IV 1 g x 1 dose and potassium chloride 40 mEq VT q4h x 2 doses today.   Patient is also on Lasix 20 mg IV q8h.  Will recheck electrolytes with AM labs.   Pharmacy will continue to follow and adjust per consult.   Broadus Johnhaeyeong Beyonca Wisz, PharmD candidate  11/01/2018 11:32 AM

## 2018-11-02 ENCOUNTER — Inpatient Hospital Stay: Payer: Medicaid Other

## 2018-11-02 DIAGNOSIS — Z9889 Other specified postprocedural states: Secondary | ICD-10-CM

## 2018-11-02 DIAGNOSIS — F1490 Cocaine use, unspecified, uncomplicated: Secondary | ICD-10-CM

## 2018-11-02 DIAGNOSIS — L8990 Pressure ulcer of unspecified site, unspecified stage: Secondary | ICD-10-CM

## 2018-11-02 DIAGNOSIS — Z7189 Other specified counseling: Secondary | ICD-10-CM

## 2018-11-02 DIAGNOSIS — D649 Anemia, unspecified: Secondary | ICD-10-CM

## 2018-11-02 DIAGNOSIS — F1910 Other psychoactive substance abuse, uncomplicated: Secondary | ICD-10-CM

## 2018-11-02 DIAGNOSIS — Z515 Encounter for palliative care: Secondary | ICD-10-CM

## 2018-11-02 LAB — CBC WITH DIFFERENTIAL/PLATELET
Abs Immature Granulocytes: 0.07 10*3/uL (ref 0.00–0.07)
BASOS ABS: 0 10*3/uL (ref 0.0–0.1)
Basophils Relative: 0 %
Eosinophils Absolute: 0 10*3/uL (ref 0.0–0.5)
Eosinophils Relative: 1 %
HCT: 20.6 % — ABNORMAL LOW (ref 36.0–46.0)
Hemoglobin: 6.2 g/dL — ABNORMAL LOW (ref 12.0–15.0)
Immature Granulocytes: 1 %
LYMPHS PCT: 7 %
Lymphs Abs: 0.4 10*3/uL — ABNORMAL LOW (ref 0.7–4.0)
MCH: 30 pg (ref 26.0–34.0)
MCHC: 30.1 g/dL (ref 30.0–36.0)
MCV: 99.5 fL (ref 80.0–100.0)
Monocytes Absolute: 0.3 10*3/uL (ref 0.1–1.0)
Monocytes Relative: 6 %
NEUTROS ABS: 4.5 10*3/uL (ref 1.7–7.7)
NEUTROS PCT: 85 %
NRBC: 0 % (ref 0.0–0.2)
PLATELETS: 144 10*3/uL — AB (ref 150–400)
RBC: 2.07 MIL/uL — AB (ref 3.87–5.11)
RDW: 22.7 % — ABNORMAL HIGH (ref 11.5–15.5)
WBC: 5.3 10*3/uL (ref 4.0–10.5)

## 2018-11-02 LAB — GLUCOSE, CAPILLARY
Glucose-Capillary: 108 mg/dL — ABNORMAL HIGH (ref 70–99)
Glucose-Capillary: 102 mg/dL — ABNORMAL HIGH (ref 70–99)
Glucose-Capillary: 98 mg/dL (ref 70–99)
Glucose-Capillary: 64 mg/dL — ABNORMAL LOW (ref 70–99)
Glucose-Capillary: 71 mg/dL (ref 70–99)
Glucose-Capillary: 90 mg/dL (ref 70–99)
Glucose-Capillary: 75 mg/dL (ref 70–99)

## 2018-11-02 LAB — PROTIME-INR
INR: 1.19
PROTHROMBIN TIME: 15 s (ref 11.4–15.2)

## 2018-11-02 LAB — BLOOD GAS, ARTERIAL
ACID-BASE EXCESS: 16.7 mmol/L — AB (ref 0.0–2.0)
Bicarbonate: 42.7 mmol/L — ABNORMAL HIGH (ref 20.0–28.0)
FIO2: 40
MECHANICAL RATE: 20
O2 Saturation: 95.3 %
PEEP: 5 cmH2O
Patient temperature: 37
VT: 450 mL
pCO2 arterial: 60 mmHg — ABNORMAL HIGH (ref 32.0–48.0)
pH, Arterial: 7.46 — ABNORMAL HIGH (ref 7.350–7.450)
pO2, Arterial: 73 mmHg — ABNORMAL LOW (ref 83.0–108.0)

## 2018-11-02 LAB — PREPARE RBC (CROSSMATCH)

## 2018-11-02 LAB — BASIC METABOLIC PANEL
Anion gap: 6 (ref 5–15)
BUN: 22 mg/dL — AB (ref 6–20)
CO2: 37 mmol/L — ABNORMAL HIGH (ref 22–32)
CREATININE: 0.33 mg/dL — AB (ref 0.44–1.00)
Calcium: 7.8 mg/dL — ABNORMAL LOW (ref 8.9–10.3)
Chloride: 95 mmol/L — ABNORMAL LOW (ref 98–111)
GFR calc Af Amer: >60 mL/min (ref >60)
Glucose, Bld: 115 mg/dL — ABNORMAL HIGH (ref 70–99)
Potassium: 3.6 mmol/L (ref 3.5–5.1)
SODIUM: 138 mmol/L (ref 135–145)

## 2018-11-02 LAB — CALCIUM, IONIZED: Calcium, Ionized, Serum: 4.5 mg/dL (ref 4.5–5.6)

## 2018-11-02 LAB — TRIGLYCERIDES
Triglycerides: 87 mg/dL (ref <150)
Triglycerides: 94 mg/dL (ref <150)

## 2018-11-02 LAB — APTT: APTT: 36 s (ref 24–36)

## 2018-11-02 LAB — PHOSPHORUS: Phosphorus: 2.7 mg/dL (ref 2.5–4.6)

## 2018-11-02 LAB — MAGNESIUM: MAGNESIUM: 2 mg/dL (ref 1.7–2.4)

## 2018-11-02 LAB — ABO/RH: ABO/RH(D): O POS

## 2018-11-02 MED ORDER — FUROSEMIDE 10 MG/ML IJ SOLN
20.0000 mg/h | Freq: Once | INTRAVENOUS | Status: DC
  Filled 2018-11-02: qty 25

## 2018-11-02 MED ORDER — JUVEN PO PACK: 1.0000 | PACK | Freq: Two times a day (BID) | ORAL | Status: DC

## 2018-11-02 MED ORDER — ORAL CARE MOUTH RINSE
15.0000 mL | Freq: Two times a day (BID) | OROMUCOSAL | Status: DC
  Administered 2018-11-03: 15 mL via OROMUCOSAL

## 2018-11-02 MED ORDER — POTASSIUM CHLORIDE 20 MEQ PO PACK: 40.0000 meq | PACK | Freq: Once | ORAL | Status: DC

## 2018-11-02 MED ORDER — CHLORHEXIDINE GLUCONATE 0.12 % MT SOLN
15.0000 mL | Freq: Two times a day (BID) | OROMUCOSAL | Status: DC
  Administered 2018-11-03 (×2): 15 mL via OROMUCOSAL
  Filled 2018-11-02: qty 15

## 2018-11-02 MED ORDER — FUROSEMIDE 10 MG/ML IJ SOLN
20.0000 mg | Freq: Once | INTRAMUSCULAR | Status: AC
  Administered 2018-11-02: 20 mg via INTRAVENOUS
  Filled 2018-11-02: qty 2

## 2018-11-02 MED ORDER — DEXMEDETOMIDINE HCL IN NACL 400 MCG/100ML IV SOLN
0.4000 ug/kg/h | INTRAVENOUS | Status: DC
  Administered 2018-11-02: 1.2 ug/kg/h via INTRAVENOUS

## 2018-11-02 MED ORDER — SODIUM CHLORIDE 0.9% IV SOLUTION
Freq: Once | INTRAVENOUS | Status: AC
  Administered 2018-11-02: 08:00:00 via INTRAVENOUS

## 2018-11-02 MED ORDER — ACETYLCYSTEINE 10 % IN SOLN: 2.0000 mL | Freq: Three times a day (TID) | RESPIRATORY_TRACT | Status: DC

## 2018-11-02 MED ORDER — KETOROLAC TROMETHAMINE 15 MG/ML IJ SOLN
15.0000 mg | Freq: Once | INTRAMUSCULAR | Status: AC
  Administered 2018-11-02: 15 mg via INTRAVENOUS
  Filled 2018-11-02: qty 1

## 2018-11-02 MED ORDER — FUROSEMIDE 10 MG/ML IJ SOLN
20.0000 mg | Freq: Once | INTRAMUSCULAR | Status: AC
  Administered 2018-11-02: 20 mg via INTRAVENOUS

## 2018-11-02 MED ORDER — PHENOL 1.4 % MT LIQD
1.0000 | OROMUCOSAL | Status: DC | PRN
  Filled 2018-11-02: qty 177

## 2018-11-02 NOTE — Progress Notes (Signed)
Extubated at 0905 to 40%/40 liters High Flow oxygen. Tolerating the change well. Spoke a few words. Resting quietly. Daughter updated when she called. Swallow evaluation ordered.

## 2018-11-02 NOTE — Progress Notes (Signed)
Date of Admission:  10/24/2018      Subjective: Got extubated- sedated now, sleeping  Medications:  . ALPRAZolam  0.5 mg Oral BID  . budesonide (PULMICORT) nebulizer solution  0.5 mg Nebulization BID  . buprenorphine  8 mg Sublingual Daily  . chlorhexidine gluconate (MEDLINE KIT)  15 mL Mouth Rinse BID  . docusate  100 mg Oral Daily  . famotidine  20 mg Per Tube Daily  . furosemide  20 mg Intravenous Q8H  . gabapentin  300 mg Oral Daily  . ipratropium-albuterol  3 mL Nebulization Q4H  . mouth rinse  15 mL Mouth Rinse 10 times per day  . nutrition supplement (JUVEN)  1 packet Per Tube BID BM  . potassium chloride  40 mEq Per Tube Once  . sennosides  5 mL Per Tube BID  . sodium chloride flush  10-40 mL Intracatheter Q12H    Objective: Vital signs in last 24 hours: Temp:  [98.2 F (36.8 C)-99.7 F (37.6 C)] 98.8 F (37.1 C) (11/26 1615) Pulse Rate:  [82-101] 91 (11/26 1600) Resp:  [10-25] 10 (11/26 1600) BP: (83-116)/(45-68) 116/68 (11/26 1615) SpO2:  [90 %-100 %] 90 % (11/26 1603) FiO2 (%):  [40 %] 40 % (11/26 1603) Weight:  [57.5 kg] 57.5 kg (11/26 0500)  PHYSICAL EXAM:  General:extubated, on nasal Hiflo Sedated  Head: Normocephalic, without obvious abnormality, atraumatic. Eyes: Conjunctivae clear, anicteric sclerae. Pupils are equal ENT cannot examine Rt IJ Back: did not examine Lungs: b/l air entry Heart:s1s2 Abdomen: Soft, non-tender,not distended. Bowel sounds normal. No masses Extremities: atraumatic, no cyanosis. No edema. No clubbing Skin: No rashes or lesions. Or bruising Lymph: Cervical, supraclavicular normal. Neurologic: cannot examine  Lab Results Recent Labs    11/01/18 0449 11/02/18 0535  WBC 6.8 5.3  HGB 7.0* 6.2*  HCT 22.2* 20.6*  NA 136 138  K 3.3* 3.6  CL 90* 95*  CO2 37* 37*  BUN 17 22*  CREATININE 0.33* 0.33*   Liver Panel Recent Labs    11/01/18 0449  PROT 6.2*  ALBUMIN 2.2*  AST 28  ALT 17  ALKPHOS 65  BILITOT 0.7      Microbiology:  Studies/Results:   Result Date: 11/02/2018 CLINICAL DATA:  Follow-up pneumonia EXAM: PORTABLE CHEST 1 VIEW COMPARISON:  11/01/2018 FINDINGS: Check shadow is stable. Right-sided PICC line, endotracheal tube and nasogastric catheter are again noted in satisfactory position. The left lung is clear aside from some minimal basilar atelectasis stable from the previous exam. The right lung demonstrates diffuse parenchymal opacities stable from the previous day. No new focal abnormality is noted. IMPRESSION: No change in bilateral parenchymal opacities right greater than left. Tubes and lines as described stable in appearance. Electronically Signed   By: Inez Catalina M.D.   On: 11/02/2018 07:04     Assessment/Plan: 62 y.o. female with a history of HEPC, COPD, HTN was admitted from home on 11/17 with headache, confusion and incontinence. As per EMS note the patient's boyfriend had mentioned that she was confused for a few days .and In the ED she was noted to have fever. 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 CT head was unremarkable she had a LP which was normal. She had resp distress with pulse ox of 80% and was intubated in the ED. Urine tox screen positive for Cocaine. CXR showed RLL infiltrate. Pt was admitted to ICU. Blood culture came back as pneumococcus. She was  started on vanco and ceftriaxone and then deescalated to ceftriaxone. She spiked another fever today and antibiotics were broadened to vanco and zosyn. I am consulted for the same.  ?Strep pneumoniae bacteremia secondary to a very dense rt sided pneumonia  Rt lung Pneumonia due to pneumococcus /rare staph in the sputum-  Repeat blood culture neg, echo no veg, Ct chest no empyema On ceftriaxone until 11/08/18 ? Acute hypoxic resp failure- due to rt sided severe pneumonia and underlying COPD -- intubated on 10/24/18 in the ED and extubated 11/26  New fever and  increasing Fio2 -resolved   Anemia- HB 6.2- getting PRBC  E.coli in the urine  Polysubstance use- + cocaine  Discussed with intensivist and her nurse  ID will sign off- call if needed

## 2018-11-02 NOTE — Progress Notes (Signed)
Name: Kylie Monroe MRN: [id number] DOB: Aug 24, 1956     CONSULTATION DATE: 10/24/2018 Subjective and objective: Remains on vent, fentanyl + propofol for sedation.  Afebrile, globin dropped to 6.2 from 7   PAST MEDICAL HISTORY :   has a past medical history of COPD (chronic obstructive pulmonary disease) ([place]), Hepatitis C, and Hypertension.  has a past surgical history that includes Cholecystectomy and prolapsed rectum. Prior to Admission medications   Medication Sig Start Date End Date Taking? Authorizing Provider  albuterol (PROAIR HFA) 108 (90 Base) MCG/ACT inhaler Inhale 90 mcg into the lungs as needed. 01/25/13   [provider]  aspirin 81 MG chewable tablet Chew 81 mg by mouth daily. 09/19/16   [provider]  bisacodyl (DULCOLAX) 5 MG EC tablet Take 5 mg by mouth daily. 09/25/08   [provider]  Buprenorphine HCl-Naloxone HCl (SUBOXONE) 8-2 MG FILM Take 8.5 Film by mouth 3 (three) times daily. 09/25/16   [provider]  FLOVENT HFA 220 MCG/ACT inhaler Inhale 220 mcg into the lungs 2 (two) times daily. 12/10/17   [provider]  gabapentin (NEURONTIN) 300 MG capsule Take 300 mg by mouth daily. 09/19/16   [provider]   No Known Allergies  FAMILY HISTORY:  family history includes Cancer in her mother. SOCIAL HISTORY:  reports that she has been smoking cigarettes. She has a 44.00 pack-year smoking history. She has never used smokeless tobacco. She reports that she has current or past drug history. Drug: Cocaine. She reports that she does not drink alcohol.  REVIEW OF SYSTEMS:   Unable to obtain due to critical illness   VITAL SIGNS: Temp:  [98.4 F (36.9 C)-100 F (37.8 C)] 98.8 F (37.1 C) (11/26 0400) Pulse Rate:  [82-114] 93 (11/26 0500) Resp:  [17-24] 18 (11/26 0500) BP: (83-130)/(45-77) 90/50 (11/26 0500) SpO2:  [91 %-98 %] 98 % (11/26 0500) FiO2 (%):  [40 %] 40 % (11/26 0334) Weight:  [57.5 kg] 57.5 kg  (11/26 0500)  Physical Examination: Sedated with fentanylto RASS of -1.Followed command and no focal motor deficits On vent, no distress, bilateral equal air entry with no adventitious sounds.Minimal clear secretions S1 & S2 are audible with no murmur Benign abdominal exam with feeble peristalsis No leg edema  ASSESSMENT / PLAN:  Ventilator dependent respiratory failure.P/F183on 40% PEEP of 5 -Tolerating SBT. -Monitor ABG, optimize vent settings and continuous ventilator support -Consider trach and PEG if continues to have poor weaning parameters  Pneumonia.improvedbilateral airspace disease Rt >Lt with Pl. Eff.And pulmonary congestion.Resp. Cult -ve.MRSA PCR negative. BAL on 10/29/2018 remains -ve -Vanc + Zosyn,s/pRocephin. Monitor CXR + CBC + FiO2 -Diuresis to improve lung compliance -ID for ABX.  COPD -Optimize bronchodilators + inhaled steroids and tapering systemic steroids  UTI. E Coli -ABX as per above. Monitor urine culture  Sepsisand septic shock. Improved and off pressers. Streptococcus pneumonia bacteremia.Procalcitonin 1.99 to 1.72to 0.89. BLD, Urineand Res cult on 11/20 remains -ve Echocardiogram 10/28/2018 LVEF 60 to 65%, no regional wall abnormalities and no reported vegetations. Rocephin, s/p  Vanc + Zosyn as per ID   Altered mental status with cocaine intoxication.CT head negative for acute intracranial abnormalities -CSF cult -ve. S/p Acyclovir  Prerenal azotemia with mild hypernatremia (improved) -Optimize hydration, monitor renal panel and urine out put.  Hypertriglyceridemia due to propofol -Optimize propofol and monitor triglycerides  Anemia -Keep hemoglobin more than 7 g/dL  Thrombocytopenia(improved) -ResumeLovenox and monitor platelet count  Full code  DVT &GI prophylaxis. Continue with  supportive care.  Mr.Hartzell and daughters wereupdatedat the bedside  Critical care time 45 min

## 2018-11-02 NOTE — Progress Notes (Addendum)
Good day. Extubated at 0905 to 40% High Flow Nasal Cannula. Oxygen saturations have been 89-96%.  Tried to refuse a bath and hair wash. Patient has complained of a headache this afternoon but cannot stay awake after she complains. My nursing judgement was not to treat the headache due to possible decline in respiratory staus. Patient also cannot remember she has a headache. Permission for sparing ice chips was granted by Dr.Samaan. Mr Esaw DaceSmithy (significant other) was warned to give them sparingly. Mr. Esaw DaceSmithy also reported that patient had problems swallowing before admission. Swallow evaluation ordered but patient not consistently alert to participate.

## 2018-11-02 NOTE — Progress Notes (Signed)
Pt was suctioned for a small amount of thick tan secretions. Per Dr. Hollace HaywardSamaan's order, she was extubated to HFNC.

## 2018-11-02 NOTE — Progress Notes (Signed)
[facility] [place] [facility]   PATIENT NAME: Kylie Monroe    MR#:  [id number]  DATE OF BIRTH:  28-Jan-1956  SUBJECTIVE:  On full vent support, with sedation.  Tolerating tube feeding.  REVIEW OF SYSTEMS:    Unable to obtain patient intubated   Tolerating Diet: Tolerating tube feeding.  DRUG ALLERGIES:  No Known Allergies  VITALS:  Blood pressure (!) 95/53, pulse 92, temperature 99.1 F (37.3 C), temperature source Bladder, resp. rate 19, height 5\' 2"  (1.575 m), weight 57.5 kg, SpO2 99 %.  PHYSICAL EXAMINATION:  Constitutional: Fragile, intubated, sedated.  Failing weaning attempts.  HENT: Normocephalic.  ET tube intact Eyes: Conjunctivae  are normal. PERRLA, no scleral icterus.  Neck: Normal ROM. Neck supple. No JVD CVS: RRR, S1/S2 +, no murmurs, no gallops, no carotid bruit.  Pulmonary: Diminished breath sounds bilaterally.  Abdominal: Soft. BS +,  no distension, tenderness, rebound or guarding.  Musculoskeletal:  No edema and no tenderness.  Neuro: Full vent, no focal neuro deficits observed. Skin: Skin is warm and dry. No rash noted.  LABORATORY PANEL:   CBC Recent Labs  Lab 11/02/18 0535  WBC 5.3  HGB 6.2*  HCT 20.6*  PLT 144*   ------------------------------------------------------------------------------------------------------------------  Chemistries  Recent Labs  Lab 11/01/18 0449 11/02/18 0535  NA 136 138  K 3.3* 3.6  CL 90* 95*  CO2 37* 37*  GLUCOSE 106* 115*  BUN 17 22*  CREATININE 0.33* 0.33*  CALCIUM 7.6* 7.8*  MG 1.9 2.0  AST 28  --   ALT 17  --   ALKPHOS 65  --   BILITOT 0.7  --    ------------------------------------------------------------------------------------------------------------------  Cardiac Enzymes No results for input(s): TROPONINI in the last 168 hours. ------------------------------------------------------------------------------------------------------------------  RADIOLOGY:  Dg Chest Port  1 View  Result Date: 11/02/2018 CLINICAL DATA:  Follow-up pneumonia EXAM: PORTABLE CHEST 1 VIEW COMPARISON:  11/01/2018 FINDINGS: Check shadow is stable. Right-sided PICC line, endotracheal tube and nasogastric catheter are again noted in satisfactory position. The left lung is clear aside from some minimal basilar atelectasis stable from the previous exam. The right lung demonstrates diffuse parenchymal opacities stable from the previous day. No new focal abnormality is noted. IMPRESSION: No change in bilateral parenchymal opacities right greater than left. Tubes and lines as described stable in appearance. Electronically Signed   By: Alcide Clever M.D.   On: 11/02/2018 07:04   Dg Chest Port 1 View  Result Date: 11/01/2018 CLINICAL DATA:  Pneumonia EXAM: PORTABLE CHEST 1 VIEW COMPARISON:  10/31/2018 FINDINGS: Dense consolidation throughout much of the right lung, unchanged. Left lower lobe airspace opacity also unchanged. Cardiomegaly. No visible effusions. Support devices are unchanged. IMPRESSION: Stable diffuse right lung and left lower lobe airspace opacities. Cardiomegaly. Electronically Signed   By: Charlett Nose M.D.   On: 11/01/2018 08:58     ASSESSMENT AND PLAN:   62 year old female with history of COPD presented with shortness of breath.  1.  Acute hypoxic respiratory failure requiring intubation due to pneumonia and COPD exacerbation: Now looks like she has been dependent respiratory failure, poor when weaning parameters, may need trach and PEG . status post bronchoscopy on 10/29/2018 for deep endotracheal cultures, so far no growth. Vent management by intensivist.   Continue steroids.  Fentanyl and propofol for sedation   2.  Streptococcus pneumonia bacteremia with septic shock: Improving, decreased procalcitonin, , continue Rocephin only.  Diuretics for pulmonary congestion, continue Keep map greater than 65 Blood culture Streptococcus  pneumonia, follow-up on the repeat cultures  from 10/27/2018 are negative  Urine culture from 10/28/2018 with no growth Endotracheal aspirate was done on 11/ 20 with no growth  CT chest. Again bronch done 10/29/18 , cultures no growth.  3.  Gram-negative UTI: Follow-up on urine culture E. coli sensitive to ceftriaxone, continue Rocephin.  4.  Acute encephalopathy with cocaine intoxication: Patient is status post lumbar puncture in the emergency room Initial CSF work-up was negative.  CSF cultures are negative Discontinue ganciclovir, patient is probably having addiction with drugs, patient started on sublingual Subutex.  5.  Electrolytes: Replete as needed  6.  Thrombocytopenia from septic shock: Monitor closely.  Lovenox DC'd SCDs platelets are stable.  7.  Urine toxicology positive for cocaine  #8 anemia chronic with chronic disease, hemoglobin dropped from 7-6.2 today.  Transfuse 1 unit of packed RBC, Management plans discussed with the patient's husband and he is  Poor prognosis with high mortality  CODE STATUS: Full  TOTAL TIME TAKING CARE OF THIS PATIENT: 33 minutes.   POSSIBLE D/C ?days, DEPENDING ON CLINICAL CONDITION.   Katha HammingSnehalatha Chesley Veasey M.D on 11/02/2018 at 10:42 AM  Between 7am to 6pm - Pager - [phone] After 6pm go to [email or web address] - Social research officer, governmentpassword EPAS [facility]  [facility]  Office  [phone]  CC: Primary care physician; Donaciano EvaWoodruff, Sarah, FNP  Note: This dictation was prepared with Dragon dictation along with smaller phrase technology. Any transcriptional errors that result from this process are unintentional.

## 2018-11-02 NOTE — Progress Notes (Addendum)
Patient ID: Kylie HuntsmanSharon D Monroe, female   DOB: 03/09/1956, 62 y.o.   MRN: [id number]  This NP visited patient at the bedside as a follow up to  yesterday's GOCs meeting.  Significant other Mr. Kylie Monroe  is at the bedside.  Patient has been extubated and is doing well.  She is easily agitated by the medical interventions he is experiencing being in the ICU.   She is receptive to applanation for medical interventions and quickly calms.  Mr. Kylie Monroe and I discussed the difficulties of  caring for a medically complicated patient layered with the struggles of addiction.  Patient is receiving her Buprennorphine 8 mg daily.    We discussed the patient's high risk for decompensation secondary to her pulmonary disease.  Spoke to her daughter Kylie Monroe this morning and updated her on the current medical situation.  She reinforces that the SO Kylie Monroe is to be  the main decision maker if the patient does not have decisional capacity herself.  Discussed the importance of continued assessing and conversation with family and  the medical providers regarding overall plan of care and treatment options ensuring decisions are within the context of the patient's values and goals of care.  Discussed the importance of consideration regarding advanced directives specific to reintubation.  Questions and concerns addressed   Discussed with bedside RN  Total time spent on the unit was 35 minutes   Greater than 50% of the time was spent in counseling and coordination of care  Lorinda CreedMary Jenea Dake NP  Palliative Medicine Team Team Phone # [phone]- [phone] Pager [phone]

## 2018-11-02 NOTE — Progress Notes (Signed)
MEDICATION RELATED CONSULT NOTE    Pharmacy Consult for Electrolyte Management Indication: hypokalemia  Labs: Recent Labs    10/31/18 0356 11/01/18 0449 11/02/18 0535 11/02/18 0920  WBC 5.8 6.8 5.3  --   HGB 7.4* 7.0* 6.2*  --   HCT 24.1* 22.2* 20.6*  --   PLT 125* 139* 144*  --   APTT  --   --   --  36  CREATININE 0.40* 0.33* 0.33*  --   MG 1.7 1.9 2.0  --   PHOS 2.0* 2.6 2.7  --   ALBUMIN  --  2.2*  --   --   PROT  --  6.2*  --   --   AST  --  28  --   --   ALT  --  17  --   --   ALKPHOS  --  65  --   --   BILITOT  --  0.7  --   --    Potassium (mmol/L)  Date Value  11/02/2018 3.6  03/13/2014 3.7   Magnesium (mg/dL)  Date Value  40/98/119111/26/2019 2.0   Phosphorus (mg/dL)  Date Value  47/82/956211/26/2019 2.7   Calcium (mg/dL)  Date Value  13/08/657811/26/2019 7.8 (L)   Calcium, Total (mg/dL)  Date Value  46/96/295204/05/2014 8.3 (L)   Albumin (g/dL)  Date Value  84/13/244011/25/2019 2.2 (L)  03/14/2014 2.3 (L)   Estimated Creatinine Clearance: 59.1 mL/min (A) (by C-G formula based on SCr of 0.33 mg/dL (L)).   Assessment: Patient is a 62yo female admitted for respiratory distress. Pharmacy consulted for electrolyte management.   Goal of Therapy:  Replace electrolytes with goals: K+ ~4, Mg ~2   Plan:  Patient's potassium was 3.6 this morning. Patient ordered to receive potassium chloride 40 mEq VT q4h x 1 dose today.   Patient is also on Lasix 20 mg IV q8h.  Will recheck electrolytes with AM labs.   Pharmacy will continue to follow and adjust per consult.   Broadus Johnhaeyeong Alexx Mcburney, PharmD candidate  11/02/2018 10:55 AM

## 2018-11-03 ENCOUNTER — Inpatient Hospital Stay: Payer: Medicaid Other

## 2018-11-03 DIAGNOSIS — F1910 Other psychoactive substance abuse, uncomplicated: Secondary | ICD-10-CM

## 2018-11-03 LAB — TYPE AND SCREEN
ABO/RH(D): O POS
Antibody Screen: NEGATIVE
UNIT DIVISION: 0
Unit division: 0

## 2018-11-03 LAB — CBC WITH DIFFERENTIAL/PLATELET
ABS IMMATURE GRANULOCYTES: 0.07 10*3/uL (ref 0.00–0.07)
BASOS ABS: 0 10*3/uL (ref 0.0–0.1)
BASOS PCT: 0 %
Eosinophils Absolute: 0 10*3/uL (ref 0.0–0.5)
Eosinophils Relative: 1 %
HEMATOCRIT: 32.4 % — AB (ref 36.0–46.0)
Hemoglobin: 10.5 g/dL — ABNORMAL LOW (ref 12.0–15.0)
Immature Granulocytes: 1 %
LYMPHS PCT: 6 %
Lymphs Abs: 0.4 10*3/uL — ABNORMAL LOW (ref 0.7–4.0)
MCH: 29.7 pg (ref 26.0–34.0)
MCHC: 32.4 g/dL (ref 30.0–36.0)
MCV: 91.8 fL (ref 80.0–100.0)
MONO ABS: 0.4 10*3/uL (ref 0.1–1.0)
Monocytes Relative: 6 %
NEUTROS ABS: 5.7 10*3/uL (ref 1.7–7.7)
NEUTROS PCT: 86 %
NRBC: 0 % (ref 0.0–0.2)
Platelets: 153 10*3/uL (ref 150–400)
RBC: 3.53 MIL/uL — AB (ref 3.87–5.11)
RDW: 21.5 % — ABNORMAL HIGH (ref 11.5–15.5)
WBC: 6.5 10*3/uL (ref 4.0–10.5)

## 2018-11-03 LAB — COMPREHENSIVE METABOLIC PANEL
ALBUMIN: 2.7 g/dL — AB (ref 3.5–5.0)
ALK PHOS: 66 U/L (ref 38–126)
ALT: 17 U/L (ref 0–44)
ANION GAP: 5 (ref 5–15)
AST: 29 U/L (ref 15–41)
BUN: 21 mg/dL — ABNORMAL HIGH (ref 6–20)
CALCIUM: 8 mg/dL — AB (ref 8.9–10.3)
CO2: 37 mmol/L — AB (ref 22–32)
Chloride: 95 mmol/L — ABNORMAL LOW (ref 98–111)
GLUCOSE: 96 mg/dL (ref 70–99)
Potassium: 3.5 mmol/L (ref 3.5–5.1)
SODIUM: 137 mmol/L (ref 135–145)
Total Bilirubin: 1.1 mg/dL (ref 0.3–1.2)
Total Protein: 7.4 g/dL (ref 6.5–8.1)

## 2018-11-03 LAB — MAGNESIUM: Magnesium: 1.8 mg/dL (ref 1.7–2.4)

## 2018-11-03 LAB — GLUCOSE, CAPILLARY
Glucose-Capillary: 77 mg/dL (ref 70–99)
Glucose-Capillary: 104 mg/dL — ABNORMAL HIGH (ref 70–99)
Glucose-Capillary: 124 mg/dL — ABNORMAL HIGH (ref 70–99)
Glucose-Capillary: 108 mg/dL — ABNORMAL HIGH (ref 70–99)
Glucose-Capillary: 126 mg/dL — ABNORMAL HIGH (ref 70–99)

## 2018-11-03 LAB — BPAM RBC
Blood Product Expiration Date: 201911292359
Blood Product Expiration Date: 201912222359
ISSUE DATE / TIME: 201911260915
ISSUE DATE / TIME: 201911261306
UNIT TYPE AND RH: 5100
Unit Type and Rh: 5100

## 2018-11-03 LAB — PROCALCITONIN: Procalcitonin: 0.15 ng/mL

## 2018-11-03 LAB — PHOSPHORUS: Phosphorus: 3.2 mg/dL (ref 2.5–4.6)

## 2018-11-03 MED ORDER — SODIUM CHLORIDE 0.9 % IV SOLN
2.0000 g | INTRAVENOUS | Status: DC
  Administered 2018-11-03 – 2018-11-07 (×5): 2 g via INTRAVENOUS
  Filled 2018-11-03 (×5): qty 2

## 2018-11-03 MED ORDER — BUPRENORPHINE HCL-NALOXONE HCL 8-2 MG SL SUBL
1.0000 | SUBLINGUAL_TABLET | SUBLINGUAL | Status: AC
  Administered 2018-11-03: 1 via SUBLINGUAL
  Filled 2018-11-03: qty 1

## 2018-11-03 MED ORDER — ACETYLCYSTEINE 20 % IN SOLN
4.0000 mL | Freq: Three times a day (TID) | RESPIRATORY_TRACT | Status: DC
  Administered 2018-11-03: 4 mL via RESPIRATORY_TRACT
  Filled 2018-11-03 (×2): qty 4

## 2018-11-03 MED ORDER — MAGNESIUM SULFATE 2 GM/50ML IV SOLN
2.0000 g | Freq: Once | INTRAVENOUS | Status: AC
  Administered 2018-11-03: 2 g via INTRAVENOUS
  Filled 2018-11-03: qty 50

## 2018-11-03 MED ORDER — DEXTROSE 5 % IV SOLN
INTRAVENOUS | Status: DC
  Administered 2018-11-03: 02:00:00 via INTRAVENOUS

## 2018-11-03 MED ORDER — POTASSIUM CHLORIDE 10 MEQ/50ML IV SOLN
10.0000 meq | INTRAVENOUS | Status: AC
  Administered 2018-11-03 (×2): 10 meq via INTRAVENOUS
  Filled 2018-11-03 (×2): qty 50

## 2018-11-03 MED ORDER — POTASSIUM CHLORIDE 20 MEQ PO PACK
40.0000 meq | PACK | Freq: Once | ORAL | Status: AC
  Administered 2018-11-03: 40 meq via ORAL
  Filled 2018-11-03: qty 2

## 2018-11-03 MED ORDER — KETOROLAC TROMETHAMINE 15 MG/ML IJ SOLN
15.0000 mg | Freq: Once | INTRAMUSCULAR | Status: AC
  Administered 2018-11-03: 15 mg via INTRAVENOUS
  Filled 2018-11-03: qty 1

## 2018-11-03 MED ORDER — OCUVITE-LUTEIN PO CAPS
1.0000 | ORAL_CAPSULE | Freq: Every day | ORAL | Status: DC
  Administered 2018-11-03 – 2018-11-07 (×4): 1 via ORAL
  Filled 2018-11-03 (×5): qty 1

## 2018-11-03 MED ORDER — BUPRENORPHINE HCL 8 MG SL SUBL
8.0000 mg | SUBLINGUAL_TABLET | Freq: Three times a day (TID) | SUBLINGUAL | Status: DC
  Administered 2018-11-03 – 2018-11-07 (×12): 8 mg via SUBLINGUAL
  Filled 2018-11-03 (×13): qty 1

## 2018-11-03 NOTE — Evaluation (Signed)
Clinical/Bedside Swallow Evaluation Patient Details  Name: Kylie Monroe MRN: [id number] Date of Birth: [birth year]  Today's Date: 11/03/2018 Time: SLP Start Time (ACUTE ONLY): 0851 SLP Stop Time (ACUTE ONLY): 0951 SLP Time Calculation (min) (ACUTE ONLY): 60 min  Past Medical History:  Past Medical History:  Diagnosis Date  . COPD (chronic obstructive pulmonary disease) ([place])   . Hepatitis C   . Hypertension    Past Surgical History:  Past Surgical History:  Procedure Laterality Date  . CHOLECYSTECTOMY    . prolapsed rectum     HPI:  HPI: (10/24/2018) The patient with past medical history of hypertension, hepatitis C and COPD presents emergency department complaining of headache.  The patient also complained of pain in her bottom.  Her husband reports the patient had been very weak today and had an episode of incontinence which prompted him to take her to call EMS for evaluation in the emergency department.  Patient was found to be febrile and met criteria for sepsis.  Due to her headache and wavering mental status emergency department performed a lumbar puncture after which the patient became hypoxic and was not protecting her airway.  She was subsequently intubated and placed on mechanical ventilation prior to the emergency department staff called the hospitalist service for admission.  The patient was extubated11/26/2019 AM.  Per infectious disease: Rt lung Pneumonia due to pneumococcus /rare staph in the sputum.   Assessment / Plan / Recommendation Clinical Impression  This 62 year old woman; with COPD, hepatitis C, Rt lung Pneumonia due to pneumococcus /rare staph in the sputum, extubated 24 hours ago; is presenting with mild oropharyngeal dysphagia characterized by consistent clinical indicators of aspiration with thin liquid (cough and throat clear).  The patient is able to take nectar-thick liquid via cup rim (with assist to hold cup) and via straw with no clinical indicators of  aspiration.  The patient is edentulous and does not have her teeth. She is able to masticate crumbled cracker in applesauce with no difficulty.  Recommend dysphagia 2 diet with nectar-thick liquids and meds whole in applesauce.  SLP will follow for ongoing assessment for diet upgrade as appropriate. SLP Visit Diagnosis: Dysphagia, oropharyngeal phase (R13.12)    Aspiration Risk  Mild aspiration risk with recommended modified diet    Diet Recommendation Dysphagia 2 (Fine chop);Nectar-thick liquid   Liquid Administration via: Cup;Straw Medication Administration: Whole meds with puree Supervision: Staff to assist with self feeding Compensations: Minimize environmental distractions;Slow rate;Small sips/bites Postural Changes: Seated upright at 90 degrees;Remain upright for at least 30 minutes after po intake    Other  Recommendations Oral Care Recommendations: Oral care QID   Follow up Recommendations Other (comment)(TBD)      Frequency and Duration min 3x week  2 weeks       Prognosis Prognosis for Safe Diet Advancement: Good      Swallow Study   General Date of Onset: 10/24/18 HPI: HPI: (10/24/2018) The patient with past medical history of hypertension, hepatitis C and COPD presents emergency department complaining of headache.  The patient also complained of pain in her bottom.  Her husband reports the patient had been very weak today and had an episode of incontinence which prompted him to take her to call EMS for evaluation in the emergency department.  Patient was found to be febrile and met criteria for sepsis.  Due to her headache and wavering mental status emergency department performed a lumbar puncture after which the patient became hypoxic and was not protecting  her airway.  She was subsequently intubated and placed on mechanical ventilation prior to the emergency department staff called the hospitalist service for admission.  The patient was extubated11/26/2019 AM.  Per  infectious disease: Rt lung Pneumonia due to pneumococcus /rare staph in the sputum. Type of Study: Bedside Swallow Evaluation Diet Prior to this Study: NPO Respiratory Status: Nasal cannula History of Recent Intubation: Yes Length of Intubations (days): 8 days Date extubated: 11/02/18 Behavior/Cognition: Alert;Cooperative;Distractible Oral Cavity Assessment: Within Functional Limits Oral Cavity - Dentition: Edentulous Self-Feeding Abilities: Needs assist Patient Positioning: Upright in bed Baseline Vocal Quality: Hoarse;Low vocal intensity;Breathy Volitional Cough: Weak    Oral/Motor/Sensory Function Overall Oral Motor/Sensory Function: Within functional limits   Ice Chips Ice chips: Within functional limits Presentation: Spoon   Thin Liquid Thin Liquid: Impaired Presentation: Cup;Spoon Pharyngeal  Phase Impairments: Suspected delayed Swallow;Throat Clearing - Immediate;Cough - Immediate    Nectar Thick Nectar Thick Liquid: Within functional limits Presentation: Cup;Straw   Honey Thick     Puree Puree: Within functional limits Presentation: Spoon   Solid     Solid: Impaired Presentation: Spoon Oral Phase Impairments: Other (comment)(Generalized weakness, edentulous) Oral Phase Functional Implications: Impaired mastication     Leroy Sea, MS/CCC- SLP  Valetta Fuller, Susie 11/03/2018,9:52 AM

## 2018-11-03 NOTE — Progress Notes (Signed)
[employer] [facility] [place] [facility]   PATIENT NAME: Kylie Monroe    MR#:  [id number]  DATE OF BIRTH:  1956/01/31  SUBJECTIVE:  Patient c/o SOB Husband at bedside  REVIEW OF SYSTEMS:    Review of Systems  Constitutional: Negative for fever, chills weight loss HENT: Negative for ear pain, nosebleeds, congestion, facial swelling, rhinorrhea, neck pain, neck stiffness and ear discharge.   Respiratory:++ for cough, shortness of breath, wheezing  Cardiovascular: Negative for chest pain, palpitations and leg swelling.  Gastrointestinal: Negative for heartburn, abdominal pain, vomiting, diarrhea or consitpation Genitourinary: Negative for dysuria, urgency, frequency, hematuria Musculoskeletal: Negative for back pain or joint pain Neurological: Negative for dizziness, seizures, syncope, focal weakness,  numbness and headaches.  Hematological: Does not bruise/bleed easily.  Psychiatric/Behavioral: Negative for hallucinations, confusion, dysphoric mood    Tolerating Diet:yes      DRUG ALLERGIES:  No Known Allergies  VITALS:  Blood pressure 133/75, pulse 98, temperature 97.8 F (36.6 C), temperature source Oral, resp. rate 13, height 5\' 2"  (1.575 m), weight 57.5 kg, SpO2 97 %.  PHYSICAL EXAMINATION:  Constitutional: Appears thin and frail on high flow nasal cannula. No distress. HENT: Normocephalic. Marland Kitchen Oropharynx is clear and moist.  Eyes: Conjunctivae and EOM are normal. PERRLA, no scleral icterus.  Neck: Normal ROM. Neck supple. No JVD. No tracheal deviation. CVS: RRR, S1/S2 +, no murmurs, no gallops, no carotid bruit.  Pulmonary: Decreased breath sounds with bilateral wheezing abdominal: Soft. BS +,  no distension, tenderness, rebound or guarding.  Musculoskeletal: Normal range of motion. No edema and no tenderness.  Neuro: Alert. CN 2-12 grossly intact. No focal deficits. Skin: Skin is warm and dry. No rash noted. Psychiatric: Normal mood and affect.       LABORATORY PANEL:   CBC Recent Labs  Lab 11/03/18 0446  WBC 6.5  HGB 10.5*  HCT 32.4*  PLT 153   ------------------------------------------------------------------------------------------------------------------  Chemistries  Recent Labs  Lab 11/03/18 0446  NA 137  K 3.5  CL 95*  CO2 37*  GLUCOSE 96  BUN 21*  CREATININE <0.30*  CALCIUM 8.0*  MG 1.8  AST 29  ALT 17  ALKPHOS 66  BILITOT 1.1   ------------------------------------------------------------------------------------------------------------------  Cardiac Enzymes No results for input(s): TROPONINI in the last 168 hours. ------------------------------------------------------------------------------------------------------------------  RADIOLOGY:  Dg Chest Port 1 View  Result Date: 11/03/2018 CLINICAL DATA:  Follow-up pneumonia EXAM: PORTABLE CHEST 1 VIEW COMPARISON:  11/02/2018 FINDINGS: Cardiac shadow is stable. Right-sided PICC line is again seen and stable. Endotracheal tube and nasogastric catheter have been removed in the interval. Stable infiltrative changes are noted bilaterally right greater than left. No new focal abnormality is seen. IMPRESSION: Interval tube and line removal.  No change in bilateral infiltrates. Electronically Signed   By: Alcide Clever M.D.   On: 11/03/2018 07:36   Dg Chest Port 1 View  Result Date: 11/02/2018 CLINICAL DATA:  Follow-up pneumonia EXAM: PORTABLE CHEST 1 VIEW COMPARISON:  11/01/2018 FINDINGS: Check shadow is stable. Right-sided PICC line, endotracheal tube and nasogastric catheter are again noted in satisfactory position. The left lung is clear aside from some minimal basilar atelectasis stable from the previous exam. The right lung demonstrates diffuse parenchymal opacities stable from the previous day. No new focal abnormality is noted. IMPRESSION: No change in bilateral parenchymal opacities right greater than left. Tubes and lines as described stable in  appearance. Electronically Signed   By: Alcide Clever M.D.   On: 11/02/2018 07:04  ASSESSMENT AND PLAN:   62 year old female with history of COPD and heroin abuse on daily Suboxone who was brought in on November 17 with confusion and respiratory failure.   1.  Acute hypoxic respiratory failure requiring intubation and now extubated on 26 November. Wean high flow to nasal cannula as tolerated  2.  Right lung pneumonia due to Streptococcus pneumonia with bacteremia: ID consultation appreciated Echo showed no vegetation Continue ceftriaxone through December 2 as per ID recommendations.  3.  E. coli UTI: Sensitive to Rocephin.  4.  Acute encephalopathy with cocaine intoxication: Patient is status post lumbar puncture on admission.  LP was essentially unremarkable. Continue sublingual Subutex due to history of heroin abuse  5.  Thrombocytopenia from septic shock: Platelets are stable  6.  Acute on chronic anemia: Patient status post 1 unit PRBC 7.  Nonsevere malnutrition in context of chronic illness: Continue nutritional supplements  Patient will need outpatient palliative care services upon discharge  Management plans discussed with the patient and husband and they are in agreement.  CODE STATUS: Full  TOTAL TIME TAKING CARE OF THIS PATIENT: 23 minutes.     POSSIBLE D/C 2 to 4 days, DEPENDING ON CLINICAL CONDITION.   Kylie Monroe M.D on 11/03/2018 at 12:35 PM  Between 7am to 6pm - Pager - [phone] After 6pm go to [email or web address] - Social research officer, governmentpassword [email or web address]  [facility] [place] [facility]  Office  [phone]  CC: Primary care physician; Donaciano EvaWoodruff, Sarah, FNP  Note: This dictation was prepared with Dragon dictation along with smaller phrase technology. Any transcriptional errors that result from this process are unintentional.

## 2018-11-03 NOTE — Progress Notes (Addendum)
Pharmacy Electrolyte Monitoring Consult:  Pharmacy consulted to assist in monitoring and replacing electrolytes in this 62 y.o. female admitted on 10/24/2018 with Headache and Urinary Tract Infection  Labs:  Sodium (mmol/L)  Date Value  11/03/2018 137  03/13/2014 136   Potassium (mmol/L)  Date Value  11/03/2018 3.5  03/13/2014 3.7   Magnesium (mg/dL)  Date Value  11/03/2018 1.8   Phosphorus (mg/dL)  Date Value  11/03/2018 3.2   Calcium (mg/dL)  Date Value  11/03/2018 8.0 (L)   Calcium, Total (mg/dL)  Date Value  03/13/2014 8.3 (L)   Albumin (g/dL)  Date Value  11/03/2018 2.7 (L)  03/14/2014 2.3 (L)    Assessment/Plan: Patient did not receive ordered potassium replacement on 11/27.   Patient ordered furosemide 20mg  IV Q8hr.   Will order potassium <[id number]>4Roxy Ce<MRoxy Ce<MERoxy Ce<MRoxy Ce<Roxy <MEASRoxy Ce<MEASURoxy C<MERoxy Ce<MEASUREMENT78469a20472 Colum562JaneneAntiRedingtBTPatPacifRoxy Ce<MEASURoxy Ce<MEASUREMENT78469a(704124 WesRoxy Ce<MEASURERoxy Ce<MEASURoxy Ce<MEASUREMENTRRoxy Roxy Ce<MEARoxy Ce<MEASRoxyRoxy Ce<MEASUREMENT784Roxy Ce<MEASURERoxy Ce<MEASUREMENT78469a90Roxy Ce<MEASUREMENT78469a504Roxy Ce<MEASUREMENT78469a60473 Woo(336JaneneARoxy Ced71arakoBTPatVantage Surgical Associates LLC Dba Vantage SurgerCharl[facility] Endoscopy Cent Will recheck electrolytes with am labs.   Leoda Smithhart L 11/03/2018 3:13 PM

## 2018-11-03 NOTE — Progress Notes (Signed)
   Name: Kylie Monroe MRN: [id number] DOB: 1956-08-04     CONSULTATION DATE: 10/24/2018  Subjective & Objectives: Extubated on 11/02/2018. HFNC 40%  PAST MEDICAL HISTORY :   has a past medical history of COPD (chronic obstructive pulmonary disease) ([place]), Hepatitis C, and Hypertension.  has a past surgical history that includes Cholecystectomy and prolapsed rectum. Prior to Admission medications   Medication Sig Start Date End Date Taking? Authorizing Provider  albuterol (PROAIR HFA) 108 (90 Base) MCG/ACT inhaler Inhale 90 mcg into the lungs as needed. 01/25/13   [provider]  aspirin 81 MG chewable tablet Chew 81 mg by mouth daily. 09/19/16   [provider]  bisacodyl (DULCOLAX) 5 MG EC tablet Take 5 mg by mouth daily. 09/25/08   [provider]  Buprenorphine HCl-Naloxone HCl (SUBOXONE) 8-2 MG FILM Take 8.5 Film by mouth 3 (three) times daily. 09/25/16   [provider]  FLOVENT HFA 220 MCG/ACT inhaler Inhale 220 mcg into the lungs 2 (two) times daily. 12/10/17   [provider]  gabapentin (NEURONTIN) 300 MG capsule Take 300 mg by mouth daily. 09/19/16   [provider]   No Known Allergies  FAMILY HISTORY:  family history includes Cancer in her mother. SOCIAL HISTORY:  reports that she has been smoking cigarettes. She has a 44.00 pack-year smoking history. She has never used smokeless tobacco. She reports that she has current or past drug history. Drug: Cocaine. She reports that she does not drink alcohol.  REVIEW OF SYSTEMS:   Unable to obtain due to critical illness   VITAL SIGNS: Temp:  [97.8 F (36.6 C)-99.7 F (37.6 C)] 97.8 F (36.6 C) (11/27 0748) Pulse Rate:  [83-100] 98 (11/27 0600) Resp:  [10-23] 13 (11/27 0600) BP: (90-135)/(51-108) 133/75 (11/27 0600) SpO2:  [90 %-98 %] 97 % (11/27 0600) FiO2 (%):  [40 %] 40 % (11/27 0452) Weight:  [57.5 kg] 57.5 kg (11/27 0452)  Physical Examination: A + O and no  acute focal motor deficits. On HFNC 40%, no distress, bilateral equal air entry with no adventitious sounds.Minimal clear secretions S1 & S2 are audible with no murmur Benign abdominal exam with feeble peristalsis No leg edema  ASSESSMENT / PLAN:  Ventilator dependentrespiratory failure.Extubated on 11/02/2018 an toelrating HFNC.  -Monitor ABG, work of breathing and optimize O2 therapy   Pneumonia.improvedbilateral airspace disease Rt >Lt with Pl. Eff.And pulmonary congestion.Resp. Cult -ve.MRSA PCR negative. BAL on 10/29/2018 remains -ve -s/p Vanc + Zosyn,OnRocephin. Monitor CXR + CBC + FiO2 -Diuresis to improve lung compliance -ID for ABX.  COPD -Optimize bronchodilators + inhaled steroids and tapering systemic steroids  UTI. E Coli -ABX as per above. Monitor urine culture  Sepsisand septic shock.Improved and off pressers. Streptococcus pneumonia bacteremia.Procalcitonin 1.99 to 1.72to 0.89. BLD, Urineand Res cult on 11/20 remains -ve Echocardiogram 10/28/2018 LVEF 60 to 65%, no regional wall abnormalities and no reported vegetations. Rocephin, s/pVanc + Zosynas per ID   Altered mental status with cocaine intoxication.CT head negative for acute intracranial abnormalities -CSF cult -ve. S/p Acyclovir  Prerenal azotemia with mild hypernatremia (improved) -Optimize hydration, monitor renal panel and urine out put.  Hypertriglyceridemia due to propofol -Optimize propofol and monitor triglycerides  Anemia -Keep hemoglobin more than 7 g/dL  Thrombocytopenia(improved) -ResumeLovenox and monitor platelet count  Full code  DVT &GI prophylaxis. Continue with supportive care.  Family was updated at the bedside  Critical care time 40 min

## 2018-11-03 NOTE — Progress Notes (Signed)
Pt husband at nurses desk asking for patient home medication of suboxene. Orders are present but husband insist she get it right now. Stat order placed for home medication. MD aware. I will continue to assess.

## 2018-11-03 NOTE — Care Management Note (Signed)
Case Management Note  Patient Details  Name: Kylie Monroe MRN: [id number] Date of Birth: 05/18/1956  Subjective/Objective:   Patient admitted with acute respiratory failure to the ICU.  Patient is currently still in the ICU extubated 11/26 and tolerating HFNC.  Patient is currently doing well on HFNC sitting up in chair.  PT has recommended SNF placement.  CSW consult placed.  Kylie LisJeanna Makalah Asberry RN BSN   [phone]                  Action/Plan:   Expected Discharge Date:  10/30/18               Expected Discharge Plan:     In-House Referral:     Discharge planning Services  CM Consult  Post Acute Care Choice:    Choice offered to:     DME Arranged:    DME Agency:     HH Arranged:    HH Agency:     Status of Service:  In process, will continue to follow  If discussed at Long Length of Stay Meetings, dates discussed:    Additional Comments:  Kylie ButcherJeanna M Kelia Gibbon, RN 11/03/2018, 2:43 PM

## 2018-11-03 NOTE — Progress Notes (Signed)
Nutrition Follow-up  DOCUMENTATION CODES:   Non-severe (moderate) malnutrition in context of chronic illness  INTERVENTION:  Will discontinue Juven as it is not nectar-thick.  Provide Hormel Shake po TID with trays, each supplement provides 520 kcal and 30 grams of protein.  Provide Ocuvite daily for wound healing (provides zinc, vitamin A, vitamin C, Vitamin E, copper, and selenium).  NUTRITION DIAGNOSIS:   Moderate Malnutrition related to chronic illness(COPD, hepatitis C) as evidenced by moderate fat depletion, moderate muscle depletion.  Ongoing.  GOAL:   Patient will meet greater than or equal to 90% of their needs  Progressing.  MONITOR:   PO intake, Supplement acceptance, Diet advancement, Labs, Weight trends, Skin, I & O's  REASON FOR ASSESSMENT:   Ventilator    ASSESSMENT:   62 year old female with PMHx of hepatitis C, HTN, COPD, hx cholecystectomy admitted with respiratory failure requiring intubation on 11/17, PNA, UTI, septic shock, AMS with cocaine intoxication.   -Patient was extubated on 11/26. -Following SLP evaluation today patient was placed on dysphagia 2 diet with nectar-thick liquids.  Patient's diet was just advanced this morning. She had not received a tray yet at time of RD assessment. Will continue to monitor intake and adjust nutrition intervention as appropriate.   Medications reviewed and include: Xanax 0.5 mg BID, Colace 100 mg daily, famotidine, Lasix 20 mg Q8hrs IV, gabapentin, Senokot 5 mL BID, ceftriaxone, D5W at 25 mL/hr.  Labs reviewed: CBG 64-104, Chloride 95, CO2 37, BUN 21, Creatinine <0.3.  Discussed with RN and on rounds.  Diet Order:   Diet Order            DIET DYS 2 Room service appropriate? Yes; Fluid consistency: Nectar Thick  Diet effective now             EDUCATION NEEDS:   No education needs have been identified at this time  Skin:  Skin Assessment: Skin Integrity Issues: Skin Integrity Issues:: Stage  II Stage II: left buttocks  Last BM:  10/30/2018 - smear type 7  Height:   Ht Readings from Last 1 Encounters:  10/24/18 5\' 2"  (1.575 m)   Weight:   Wt Readings from Last 1 Encounters:  11/03/18 57.5 kg   Ideal Body Weight:  50 kg  BMI:  Body mass index is 23.19 kg/m.  Estimated Nutritional Needs:   Kcal:  1610-1725 (28-30 kcal/kg)  Protein:  80-95 grams (1.4-1.7 grams/kg)  Fluid:  1.6-1.7 L/day (1 mL/kcal)  Kylie RimaLeanne Messiah Ahr, MS, RD, LDN Office: [phone] Pager: [phone] After Hours/Weekend Pager: [phone]

## 2018-11-03 NOTE — Progress Notes (Signed)
Patient c/o headache to RN. Wanting ice chips. Explained to patient need to get a blood gas to check her breathing this morning. She asked why can't I get blood out line like RN. I explained why. Before I stuck her she started hollering it was going to hurt. I barely stuck touch her skin and she started refusing requesting me to stop and take needle out. Patient has refused. Informed RN

## 2018-11-03 NOTE — Evaluation (Signed)
Physical Therapy Evaluation Patient Details Name: Kylie Monroe MRN: [id number] DOB: 09/20/1956 Today's Date: 11/03/2018   History of Present Illness  presented to ER secondary to HA, progressive weakness; admitted for managment of septic shock related to R UL PNA and metabolic encephalopathy due to cocaine intoxication.  Hospital course significant for intubation 11/17-11/26; currently weaned to HFNC.  Clinical Impression  Upon evaluation, patient alert and oriented; follows commands.  Eager for OOB activities as tolerated.  Significant weakness and deconditioning throughout all extremities, with marked difficulty functionally utilizing bilat hands (R > L)-which patient reports as new deficit.  Denies sensory loss or pain.  Currently requiring min assist for bed mobility; mod/max assist for sit/stand, standing balance and very short-distance gait (bed/chair) with RW.  Poor postural control (extensive assist for hip/knee extension), poor dynamic balance and significant difficulty grasping RW during transfer. Additional gait deferred as result.  Patient somewhat tearful upon transfer to chair, somewhat surprised by difficulty with mobility ("I didn't realize how far this had set me back").  Provided with encouragement and reassurance that therapy would work to improve strength and recover functional ability. Of note, vitals stable and WFL on HFNC throughout session. Would benefit from skilled PT to address above deficits and promote optimal return to PLOF; recommend transition to STR upon discharge from acute hospitalization.     Follow Up Recommendations SNF    Equipment Recommendations       Recommendations for Other Services       Precautions / Restrictions Precautions Precautions: Fall Restrictions Weight Bearing Restrictions: No      Mobility  Bed Mobility Overal bed mobility: Needs Assistance Bed Mobility: Supine to Sit     Supine to sit: Min assist         Transfers Overall transfer level: Needs assistance Equipment used: Rolling walker (2 wheeled) Transfers: Sit to/from Stand Sit to Stand: Mod assist         General transfer comment: extensive assist for hand/foot placement, lift off and postural extension (bilat hip extension).  Poor hip and knee control  Ambulation/Gait Ambulation/Gait assistance: Mod assist;Max assist Gait Distance (Feet): 5 Feet Assistive device: Rolling walker (2 wheeled)       General Gait Details: forward flexed posture, difficulty with bilat UE grasp on RW; poor hip extension, L > R genu recurvatum in loading.  Poor balance and weight shift; unsafe to attempt without extensive physical assist at this time  Stairs            Wheelchair Mobility    Modified Rankin (Stroke Patients Only)       Balance Overall balance assessment: Needs assistance Sitting-balance support: No upper extremity supported;Feet supported Sitting balance-Leahy Scale: Fair     Standing balance support: Bilateral upper extremity supported Standing balance-Leahy Scale: Zero                               Pertinent Vitals/Pain Pain Assessment: No/denies pain    Home Living Family/patient expects to be discharged to:: Private residence Living Arrangements: Spouse/significant other Available Help at Discharge: Family Type of Home: Mobile home Home Access: Stairs to enter Entrance Stairs-Rails: Right Entrance Stairs-Number of Steps: 5 Home Layout: One level        Prior Function Level of Independence: Independent with assistive device(s)         Comments: Mod indep with SPC for ADLs, household and limited community mobilization; denies home O2.  Hand Dominance        Extremity/Trunk Assessment   Upper Extremity Assessment Upper Extremity Assessment: Generalized weakness(shoulder and elbows grossly 3-/5, wrist, hands and fingers grossly 2-/5.  signficant difficulty with bilat grasp,  finger flex/ext and overall coordiantion/functional use of hands)    Lower Extremity Assessment Lower Extremity Assessment: Generalized weakness(grossly at least 3-/5 throughout)       Communication   Communication: (poor voice quality)  Cognition Arousal/Alertness: Awake/alert Behavior During Therapy: WFL for tasks assessed/performed Overall Cognitive Status: Within Functional Limits for tasks assessed                                        General Comments      Exercises Other Exercises Other Exercises: Sit/stand x2 with RW, mod/max assist +1 for lift off, postural extension; poor balance, limited activity tolerance   Assessment/Plan    PT Assessment Patient needs continued PT services  PT Problem List Decreased strength;Decreased range of motion;Decreased activity tolerance;Decreased balance;Decreased knowledge of use of DME;Decreased cognition;Decreased coordination;Decreased mobility;Decreased safety awareness;Decreased knowledge of precautions;Cardiopulmonary status limiting activity;Impaired sensation       PT Treatment Interventions DME instruction;Gait training;Stair training;Functional mobility training;Therapeutic activities;Therapeutic exercise;Balance training;Patient/family education    PT Goals (Current goals can be found in the Care Plan section)  Acute Rehab PT Goals Patient Stated Goal: to get strength back PT Goal Formulation: With patient Time For Goal Achievement: 11/17/18 Potential to Achieve Goals: Good    Frequency Min 2X/week   Barriers to discharge Decreased caregiver support      Co-evaluation               AM-PAC PT "6 Clicks" Mobility  Outcome Measure Help needed turning from your back to your side while in a flat bed without using bedrails?: A Little Help needed moving from lying on your back to sitting on the side of a flat bed without using bedrails?: A Little Help needed moving to and from a bed to a chair  (including a wheelchair)?: A Lot Help needed standing up from a chair using your arms (e.g., wheelchair or bedside chair)?: A Lot Help needed to walk in hospital room?: A Lot Help needed climbing 3-5 steps with a railing? : Total 6 Click Score: 13    End of Session Equipment Utilized During Treatment: Gait belt;Oxygen Activity Tolerance: Patient limited by fatigue Patient left: in chair;with call bell/phone within reach Nurse Communication: Mobility status PT Visit Diagnosis: Unsteadiness on feet (R26.81);Muscle weakness (generalized) (M62.81);Difficulty in walking, not elsewhere classified (R26.2)    Time: 1202-1223 PT Time Calculation (min) (ACUTE ONLY): 21 min   Charges:   PT Evaluation $PT Eval Moderate Complexity: 1 Mod PT Treatments $Therapeutic Activity: 8-22 mins        Horris Speros H. Manson Passey, PT, DPT, NCS 11/03/18, 2:02 PM [phone]

## 2018-11-04 ENCOUNTER — Inpatient Hospital Stay: Payer: Medicaid Other

## 2018-11-04 LAB — GLUCOSE, CAPILLARY
Glucose-Capillary: 98 mg/dL (ref 70–99)
Glucose-Capillary: 207 mg/dL — ABNORMAL HIGH (ref 70–99)
Glucose-Capillary: 108 mg/dL — ABNORMAL HIGH (ref 70–99)

## 2018-11-04 LAB — CBC WITH DIFFERENTIAL/PLATELET
ABS IMMATURE GRANULOCYTES: 0.05 10*3/uL (ref 0.00–0.07)
Basophils Absolute: 0 10*3/uL (ref 0.0–0.1)
Basophils Relative: 0 %
Eosinophils Absolute: 0.1 10*3/uL (ref 0.0–0.5)
Eosinophils Relative: 1 %
HCT: 32.3 % — ABNORMAL LOW (ref 36.0–46.0)
HEMOGLOBIN: 10.2 g/dL — AB (ref 12.0–15.0)
IMMATURE GRANULOCYTES: 1 %
LYMPHS ABS: 0.5 10*3/uL — AB (ref 0.7–4.0)
Lymphocytes Relative: 12 %
MCH: 29.7 pg (ref 26.0–34.0)
MCHC: 31.6 g/dL (ref 30.0–36.0)
MCV: 93.9 fL (ref 80.0–100.0)
MONO ABS: 0.4 10*3/uL (ref 0.1–1.0)
MONOS PCT: 8 %
Neutro Abs: 3.6 10*3/uL (ref 1.7–7.7)
Neutrophils Relative %: 78 %
Platelets: 167 10*3/uL (ref 150–400)
RBC: 3.44 MIL/uL — AB (ref 3.87–5.11)
RDW: 20.9 % — ABNORMAL HIGH (ref 11.5–15.5)
WBC: 4.7 10*3/uL (ref 4.0–10.5)
nRBC: 0 % (ref 0.0–0.2)

## 2018-11-04 LAB — BASIC METABOLIC PANEL
ANION GAP: 9 (ref 5–15)
BUN: 14 mg/dL (ref 6–20)
CALCIUM: 8.3 mg/dL — AB (ref 8.9–10.3)
CO2: 34 mmol/L — ABNORMAL HIGH (ref 22–32)
Chloride: 95 mmol/L — ABNORMAL LOW (ref 98–111)
Glucose, Bld: 97 mg/dL (ref 70–99)
POTASSIUM: 3.5 mmol/L (ref 3.5–5.1)
SODIUM: 138 mmol/L (ref 135–145)

## 2018-11-04 LAB — MAGNESIUM: Magnesium: 2 mg/dL (ref 1.7–2.4)

## 2018-11-04 LAB — CALCIUM, IONIZED
Calcium, Ionized, Serum: 4.7 mg/dL (ref 4.5–5.6)
Calcium, Ionized, Serum: 4.8 mg/dL (ref 4.5–5.6)

## 2018-11-04 LAB — PHOSPHORUS: Phosphorus: 3.5 mg/dL (ref 2.5–4.6)

## 2018-11-04 MED ORDER — ACETYLCYSTEINE 20 % IN SOLN
4.0000 mL | Freq: Two times a day (BID) | RESPIRATORY_TRACT | Status: DC
  Administered 2018-11-04: 4 mL via RESPIRATORY_TRACT
  Filled 2018-11-04 (×2): qty 4

## 2018-11-04 MED ORDER — ACETYLCYSTEINE 20 % IN SOLN
4.0000 mL | Freq: Three times a day (TID) | RESPIRATORY_TRACT | Status: DC
  Administered 2018-11-04: 4 mL via RESPIRATORY_TRACT
  Filled 2018-11-04 (×2): qty 4

## 2018-11-04 NOTE — Progress Notes (Signed)
[facility] - [place] at [facility]   PATIENT NAME: Kylie Monroe    MR#:  [id number]  DATE OF BIRTH:  03/26/1956  SUBJECTIVE:  Feels weak and has SOB.  Chronic pain syndrome.  Tearful  REVIEW OF SYSTEMS:    Review of Systems  Constitutional: Negative for fever, chills weight loss. Fatigue HENT: Negative for ear pain, nosebleeds, congestion, facial swelling, rhinorrhea, neck pain, neck stiffness and ear discharge.   Respiratory:++ for cough, shortness of breath, wheezing  Cardiovascular: Negative for chest pain, palpitations and leg swelling.  Gastrointestinal: Negative for heartburn, abdominal pain, vomiting, diarrhea or consitpation Genitourinary: Negative for dysuria, urgency, frequency, hematuria Musculoskeletal: Generalized weakness Neurological: Negative for dizziness, seizures, syncope, focal weakness,  numbness and headaches.  Hematological: Does not bruise/bleed easily.  Psychiatric/Behavioral: Negative for hallucinations, confusion, dysphoric mood  Tolerating Diet:yes  DRUG ALLERGIES:  No Known Allergies  VITALS:  Blood pressure (!) 101/58, pulse 79, temperature (!) 97.3 F (36.3 C), temperature source Oral, resp. rate 19, height 5\' 2"  (1.575 m), weight 52.9 kg, SpO2 96 %.  PHYSICAL EXAMINATION:  Constitutional: Appears thin and frail on high flow nasal cannula. No distress. HENT: Normocephalic. Marland Kitchen. Oropharynx is clear and moist.  Eyes: Conjunctivae and EOM are normal. PERRLA, no scleral icterus.  Neck: Normal ROM. Neck supple. No JVD. No tracheal deviation. CVS: RRR, S1/S2 +, no murmurs, no gallops, no carotid bruit.  Pulmonary: Decreased breath sounds with bilateral wheezing  abdominal: Soft. BS +,  no distension, tenderness, rebound or guarding.  Musculoskeletal: Normal range of motion. No edema and no tenderness.  Arthritic changes in hands Neuro: Alert. CN 2-12 grossly intact. No focal deficits. Skin: Skin is warm and dry. No rash  noted. Psychiatric: Anxious   LABORATORY PANEL:   CBC Recent Labs  Lab 11/04/18 0623  WBC 4.7  HGB 10.2*  HCT 32.3*  PLT 167   ------------------------------------------------------------------------------------------------------------------  Chemistries  Recent Labs  Lab 11/03/18 0446 11/04/18 0623  NA 137 138  K 3.5 3.5  CL 95* 95*  CO2 37* 34*  GLUCOSE 96 97  BUN 21* 14  CREATININE <0.30* <0.30*  CALCIUM 8.0* 8.3*  MG 1.8 2.0  AST 29  --   ALT 17  --   ALKPHOS 66  --   BILITOT 1.1  --    ------------------------------------------------------------------------------------------------------------------  Cardiac Enzymes No results for input(s): TROPONINI in the last 168 hours. ------------------------------------------------------------------------------------------------------------------  RADIOLOGY:  Dg Chest Port 1 View  Result Date: 11/04/2018 CLINICAL DATA:  Follow-up pneumonia EXAM: PORTABLE CHEST 1 VIEW COMPARISON:  11/03/2018 FINDINGS: Cardiac shadow is enlarged but accentuated by the portable technique. Right-sided PICC line is again noted and stable. The left lung demonstrates some minimal basilar infiltrates stable from the prior exam. Persistent infiltrate within the right lung is noted and stable. No new focal abnormality is noted. Old clavicular fracture is noted on the left. IMPRESSION: Bilateral infiltrates right greater than left stable from the previous day. Electronically Signed   By: Alcide CleverMark  Lukens M.D.   On: 11/04/2018 07:36   Dg Chest Port 1 View  Result Date: 11/03/2018 CLINICAL DATA:  Follow-up pneumonia EXAM: PORTABLE CHEST 1 VIEW COMPARISON:  11/02/2018 FINDINGS: Cardiac shadow is stable. Right-sided PICC line is again seen and stable. Endotracheal tube and nasogastric catheter have been removed in the interval. Stable infiltrative changes are noted bilaterally right greater than left. No new focal abnormality is seen. IMPRESSION: Interval  tube and line removal.  No change in bilateral infiltrates. Electronically Signed  By: Alcide Clever M.D.   On: 11/03/2018 07:36     ASSESSMENT AND PLAN:   62 year old female with history of COPD and heroin abuse on daily Suboxone who was brought in on November 17 with confusion and respiratory failure.   1.  Acute hypoxic respiratory failure Needed  intubation and extubated on 11/26. Continues to be on HFNC 45% and needs inpatient care  2.  Streptococcus pneumonia Right lung pneumonia and bacteremia ID consultation appreciated Echo showed no vegetation Continue IV ceftriaxone through December 2 as per ID recommendations.  3.  E. coli UTI:  Sensitive to Rocephin.  4.  Acute encephalopathy with cocaine intoxication: Patient is status post lumbar puncture on admission.  LP was essentially unremarkable. Continue sublingual Subutex due to history of heroin abuse  5.  Thrombocytopenia from bacteremia has resolved  6.  Acute on chronic anemia: Patient status post 1 unit PRBC. Hb at 10  7.  Nonsevere malnutrition in context of chronic illness:  Continue nutritional supplements  Extremely weak and on HFNC. Wants to go home today. Advised against it.  Patient will need outpatient palliative care services upon discharge  Management plans discussed with the patient and husband and they are in agreement.  CODE STATUS: Full  TOTAL TIME TAKING CARE OF THIS PATIENT: 30 minutes.   POSSIBLE D/C 2-3  days, DEPENDING ON CLINICAL CONDITION.  Orie Fisherman M.D on 11/04/2018 at 8:49 AM  Between 7am to 6pm - Pager - [phone]  After 6pm go to [email or web address] - Social research officer, government  [facility] [place] [facility]  Office  [phone]  CC: Primary care physician; Donaciano Eva, FNP  Note: This dictation was prepared with Dragon dictation along with smaller phrase technology. Any transcriptional errors that result from this process are unintentional.

## 2018-11-04 NOTE — Progress Notes (Signed)
Pharmacy Electrolyte Monitoring Consult:  Pharmacy consulted to assist in monitoring and replacing electrolytes in this 62 y.o. female admitted on 10/24/2018 with Headache and Urinary Tract Infection  Labs:  Sodium (mmol/L)  Date Value  11/04/2018 138  03/13/2014 136   Potassium (mmol/L)  Date Value  11/04/2018 3.5  03/13/2014 3.7   Magnesium (mg/dL)  Date Value  78/46/962911/28/2019 2.0   Phosphorus (mg/dL)  Date Value  52/84/132411/28/2019 3.5   Calcium (mg/dL)  Date Value  40/10/272511/28/2019 8.3 (L)   Calcium, Total (mg/dL)  Date Value  36/64/403404/05/2014 8.3 (L)   Albumin (g/dL)  Date Value  74/25/956311/27/2019 2.7 (L)  03/14/2014 2.3 (L)    Assessment/Plan: Electrolytes WNL. Since this was a CCM consult pharmacy will sign off. Please re-consult if further assistance is desired.  Carola FrostNathan A Avyan Livesay, Pharm.D., BCPS Clinical Pharmacist 11/04/2018 7:05 AM

## 2018-11-05 LAB — CBC WITH DIFFERENTIAL/PLATELET
ABS IMMATURE GRANULOCYTES: 0.02 10*3/uL (ref 0.00–0.07)
Basophils Absolute: 0 10*3/uL (ref 0.0–0.1)
Basophils Relative: 0 %
Eosinophils Absolute: 0 10*3/uL (ref 0.0–0.5)
Eosinophils Relative: 1 %
HEMATOCRIT: 30.9 % — AB (ref 36.0–46.0)
HEMOGLOBIN: 9.9 g/dL — AB (ref 12.0–15.0)
Immature Granulocytes: 1 %
LYMPHS PCT: 15 %
Lymphs Abs: 0.6 10*3/uL — ABNORMAL LOW (ref 0.7–4.0)
MCH: 30.6 pg (ref 26.0–34.0)
MCHC: 32 g/dL (ref 30.0–36.0)
MCV: 95.4 fL (ref 80.0–100.0)
MONOS PCT: 10 %
Monocytes Absolute: 0.4 10*3/uL (ref 0.1–1.0)
NEUTROS ABS: 2.8 10*3/uL (ref 1.7–7.7)
Neutrophils Relative %: 73 %
Platelets: 160 10*3/uL (ref 150–400)
RBC: 3.24 MIL/uL — ABNORMAL LOW (ref 3.87–5.11)
RDW: 20.4 % — ABNORMAL HIGH (ref 11.5–15.5)
WBC: 3.8 10*3/uL — ABNORMAL LOW (ref 4.0–10.5)
nRBC: 0 % (ref 0.0–0.2)

## 2018-11-05 LAB — BASIC METABOLIC PANEL
Anion gap: 8 (ref 5–15)
BUN: 16 mg/dL (ref 6–20)
CALCIUM: 8.2 mg/dL — AB (ref 8.9–10.3)
CO2: 32 mmol/L (ref 22–32)
Chloride: 95 mmol/L — ABNORMAL LOW (ref 98–111)
GLUCOSE: 116 mg/dL — AB (ref 70–99)
Potassium: 3.8 mmol/L (ref 3.5–5.1)
Sodium: 135 mmol/L (ref 135–145)

## 2018-11-05 LAB — GLUCOSE, CAPILLARY
Glucose-Capillary: 88 mg/dL (ref 70–99)
Glucose-Capillary: 96 mg/dL (ref 70–99)
Glucose-Capillary: 90 mg/dL (ref 70–99)
Glucose-Capillary: 104 mg/dL — ABNORMAL HIGH (ref 70–99)

## 2018-11-05 LAB — PHOSPHORUS: Phosphorus: 3.4 mg/dL (ref 2.5–4.6)

## 2018-11-05 LAB — CALCIUM, IONIZED: CALCIUM, IONIZED, SERUM: 4.7 mg/dL (ref 4.5–5.6)

## 2018-11-05 LAB — MAGNESIUM: Magnesium: 2 mg/dL (ref 1.7–2.4)

## 2018-11-05 MED ORDER — IBUPROFEN 400 MG PO TABS
400.0000 mg | ORAL_TABLET | Freq: Four times a day (QID) | ORAL | Status: DC | PRN
  Administered 2018-11-06: 400 mg via ORAL
  Filled 2018-11-05: qty 1

## 2018-11-05 MED ORDER — LORAZEPAM 0.5 MG PO TABS
0.5000 mg | ORAL_TABLET | Freq: Three times a day (TID) | ORAL | Status: DC | PRN
  Administered 2018-11-05: 0.5 mg via ORAL
  Filled 2018-11-05: qty 1

## 2018-11-05 MED ORDER — ACETYLCYSTEINE 20 % IN SOLN
4.0000 mL | Freq: Two times a day (BID) | RESPIRATORY_TRACT | Status: DC
  Administered 2018-11-05 (×2): 4 mL via RESPIRATORY_TRACT
  Filled 2018-11-05 (×4): qty 4

## 2018-11-05 NOTE — Care Management (Addendum)
Moved out of icu 11/27. Per attending, patient declines going to a skilled nursing facility.   She did inform CM that I want to go home. Very agreeable to home health and provided list from medicare.gov site. No preference. Heads up referral to [facility] for RN PT OT Aide SW and oxygen.  Patient current oxygen requirements are acute and high flow at 45%. High flow oxygen can not be provided in the home.  Patient confirms her address and says  phone number is in working order.  Was last seen by her care provider at [facility] 2 months ago.  She was not able to participate with therapy today due to fatigue.  She has a wheelchair, walker and bedside commode in the home. Husband will transport home. Patient has experienced encephalopathy during this stay due to cocaine intoxication.

## 2018-11-05 NOTE — Progress Notes (Signed)
[facility] - [place] at [facility]   PATIENT NAME: Kylie Monroe    MR#:  [id number]  DATE OF BIRTH:  Mar 19, 1956  SUBJECTIVE:   Chronic headache is the same.  Feels weak.  Dry cough. On 6 L oxygen.  REVIEW OF SYSTEMS:    Review of Systems  Constitutional: Negative for fever, chills weight loss. Fatigue HENT: Negative for ear pain, nosebleeds, congestion, facial swelling, rhinorrhea, neck pain, neck stiffness and ear discharge.   Respiratory:++ for cough, shortness of breath, wheezing  Cardiovascular: Negative for chest pain, palpitations and leg swelling.  Gastrointestinal: Negative for heartburn, abdominal pain, vomiting, diarrhea or consitpation Genitourinary: Negative for dysuria, urgency, frequency, hematuria Musculoskeletal: Generalized weakness Neurological: Negative for dizziness, seizures, syncope, focal weakness,  numbness and headaches.  Hematological: Does not bruise/bleed easily.  Psychiatric/Behavioral: Negative for hallucinations, confusion, dysphoric mood  Tolerating Diet:yes  DRUG ALLERGIES:  No Known Allergies  VITALS:  Blood pressure (!) 132/109, pulse (!) 114, temperature 97.6 F (36.4 C), temperature source Oral, resp. rate 19, height 5\' 2"  (1.575 m), weight 52.2 kg, SpO2 98 %.  PHYSICAL EXAMINATION:  Constitutional: Appears thin and frail on high flow nasal cannula.  HENT: Normocephalic. Marland Kitchen Oropharynx is clear and moist.  Eyes: Conjunctivae and EOM are normal. PERRLA, no scleral icterus.  Neck: Normal ROM. Neck supple. No JVD. No tracheal deviation. CVS: RRR, S1/S2 +, no murmurs, no gallops, no carotid bruit.  Pulmonary: Decreased breath sounds with bilateral wheezing  abdominal: Soft. BS +,  no distension, tenderness, rebound or guarding.  Musculoskeletal: Normal range of motion. No edema and no tenderness.  Arthritic changes in hands Neuro: Alert. CN 2-12 grossly intact. No focal deficits. Skin: Skin is warm and dry. No rash  noted. Psychiatric: Anxious   LABORATORY PANEL:   CBC Recent Labs  Lab 11/05/18 0605  WBC 3.8*  HGB 9.9*  HCT 30.9*  PLT 160   ------------------------------------------------------------------------------------------------------------------  Chemistries  Recent Labs  Lab 11/03/18 0446  11/05/18 0605  NA 137   < > 135  K 3.5   < > 3.8  CL 95*   < > 95*  CO2 37*   < > 32  GLUCOSE 96   < > 116*  BUN 21*   < > 16  CREATININE <0.30*   < > <0.30*  CALCIUM 8.0*   < > 8.2*  MG 1.8   < > 2.0  AST 29  --   --   ALT 17  --   --   ALKPHOS 66  --   --   BILITOT 1.1  --   --    < > = values in this interval not displayed.   ------------------------------------------------------------------------------------------------------------------  Cardiac Enzymes No results for input(s): TROPONINI in the last 168 hours. ------------------------------------------------------------------------------------------------------------------  RADIOLOGY:  Dg Chest Port 1 View  Result Date: 11/04/2018 CLINICAL DATA:  Follow-up pneumonia EXAM: PORTABLE CHEST 1 VIEW COMPARISON:  11/03/2018 FINDINGS: Cardiac shadow is enlarged but accentuated by the portable technique. Right-sided PICC line is again noted and stable. The left lung demonstrates some minimal basilar infiltrates stable from the prior exam. Persistent infiltrate within the right lung is noted and stable. No new focal abnormality is noted. Old clavicular fracture is noted on the left. IMPRESSION: Bilateral infiltrates right greater than left stable from the previous day. Electronically Signed   By: Alcide Clever M.D.   On: 11/04/2018 07:36     ASSESSMENT AND PLAN:   62 year old female with history of COPD  and heroin abuse on daily Suboxone who was brought in on November 17 with confusion and respiratory failure.   *  Acute hypoxic respiratory failure Needed  intubation and extubated on 11/26. Continues to be on HFNC 45% and needs  inpatient care  *  Streptococcus pneumonia Right lung pneumonia and bacteremia ID consultation appreciated Echo showed no vegetation Continue IV ceftriaxone through December 2 as per ID recommendations.  *  E. coli UTI:  Sensitive to Rocephin.  *  Acute encephalopathy with cocaine intoxication: Patient is status post lumbar puncture on admission.  LP was essentially unremarkable. Continue sublingual Subutex due to history of heroin abuse  *  Thrombocytopenia from bacteremia has resolved  *  Acute on chronic anemia: Patient status post 1 unit PRBC. Hb at 10  *  Nonsevere malnutrition in context of chronic illness:  Continue nutritional supplements  Continues to be on 6 L oxygen and needs inpatient care.  Wants to go home with home health and not to rehab.  Likely discharge in 2 days.  Management plans discussed with the patient and husband and they are in agreement.  CODE STATUS: Full  TOTAL TIME TAKING CARE OF THIS PATIENT: 30 minutes.   Orie FishermanSrikar R Froylan Hobby M.D on 11/05/2018 at 11:01 AM  Between 7am to 6pm - Pager - [phone]  After 6pm go to [email or web address] - Social research officer, governmentpassword EPAS [facility]  [facility] [place] [facility]  Office  [phone]  CC: Primary care physician; Donaciano EvaWoodruff, Sarah, FNP  Note: This dictation was prepared with Dragon dictation along with smaller phrase technology. Any transcriptional errors that result from this process are unintentional.

## 2018-11-05 NOTE — Clinical Social Work Note (Signed)
CSW received referral for SNF.  Case discussed with case manager and plan is to discharge home with home health.  CSW to sign off please re-consult if social work needs arise.  Saivion Goettel R. Decklan Mau, MSW, LCSWA [phone]  

## 2018-11-05 NOTE — NC FL2 (Signed)
[place] MEDICAID FL2 LEVEL OF CARE SCREENING TOOL     IDENTIFICATION  Patient Name: Kylie Monroe Birthdate: 01-18-1956 Sex: female Admission Date (Current Location): 10/24/2018  [place] and [place] Number:  [address] [id number] F Facility and Address:  [facility], [address], [place], [place] [address]      Provider Number: [id number]  Attending Physician Name and Address:  Milagros Loll, MD  Relative Name and Phone Number:  Lona Kettle [phone] or [facility] Daughter [phone]  [phone] or Levada Schilling Daughter   [phone]     Current Level of Care: Hospital Recommended Level of Care: Skilled Nursing Facility Prior Approval Number:    Date Approved/Denied:   PASRR Number: [id number]  Discharge Plan: SNF    Current Diagnoses: Patient Active Problem List   Diagnosis Date Noted  . Substance abuse (HCC)   . Pressure injury of skin 11/02/2018  . DNR (do not resuscitate) discussion   . Palliative care by specialist   . Malnutrition of moderate degree 10/26/2018  . Pneumonia   . Septic shock (HCC)   . Acute respiratory failure with hypoxia (HCC) 10/24/2018  . Leukopenia 01/24/2018  . Thrombocytopenia (HCC) 01/24/2018  . Other pancytopenia (HCC) 12/25/2017  . Iron deficiency anemia 12/25/2017    Orientation RESPIRATION BLADDER Height & Weight     Self, Time, Situation, Place  O2(6L) Incontinent Weight: 115 lb (52.2 kg) Height:  5\' 2"  (157.5 cm)  BEHAVIORAL SYMPTOMS/MOOD NEUROLOGICAL BOWEL NUTRITION STATUS      Incontinent Diet(Dysphagia 2 diet)  AMBULATORY STATUS COMMUNICATION OF NEEDS Skin   Limited Assist Verbally PU Stage and Appropriate Care PU Stage 1 Dressing: (Every 3 days)                     Personal Care Assistance Level of Assistance  Bathing, Feeding, Dressing Bathing Assistance: Limited assistance Feeding assistance: Limited assistance Dressing Assistance: Limited assistance      Functional Limitations Info  Sight, Hearing, Speech Sight Info: Adequate Hearing Info: Adequate Speech Info: Adequate    SPECIAL CARE FACTORS FREQUENCY  PT (By licensed PT)     PT Frequency: 5x a week              Contractures Contractures Info: Not present    Additional Factors Info  Code Status, Allergies, Psychotropic Code Status Info: Full Code Allergies Info: NKA Psychotropic Info: ALPRAZolam Prudy Feeler) tablet 0.5 mg          Current Medications (11/05/2018):  This is the current hospital active medication list Current Facility-Administered Medications  Medication Dose Route Frequency Provider Last Rate Last Dose  . acetaminophen (TYLENOL) tablet 650 mg  650 mg Oral Q6H PRN Arnaldo Natal, MD   650 mg at 11/03/18 2242   Or  . acetaminophen (TYLENOL) suppository 650 mg  650 mg Rectal Q6H PRN Arnaldo Natal, MD      . acetylcysteine (MUCOMYST) 20 % nebulizer / oral solution 4 mL  4 mL Nebulization BID Adrian Saran, MD   4 mL at 11/05/18 0736  . albuterol (PROVENTIL) (2.5 MG/3ML) 0.083% nebulizer solution 2.5 mg  2.5 mg Nebulization Q4H PRN Arnaldo Natal, MD      . ALPRAZolam Prudy Feeler) tablet 0.5 mg  0.5 mg Oral BID Uvaldo Rising, MD   0.5 mg at 11/05/18 0928  . budesonide (PULMICORT) nebulizer solution 0.5 mg  0.5 mg Nebulization BID Erin Fulling, MD   0.5 mg at 11/05/18 0736  . buprenorphine (SUBUTEX) sublingual tablet 8  mg  8 mg Sublingual Q8H Samaan, Maged, MD   8 mg at 11/05/18 0549  . cefTRIAXone (ROCEPHIN) 2 g in sodium chloride 0.9 % 100 mL IVPB  2 g Intravenous Q24H Bertram SavinSimpson, Michael L, RPH 200 mL/hr at 11/04/18 1724 2 g at 11/04/18 1724  . docusate (COLACE) 50 MG/5ML liquid 100 mg  100 mg Oral Daily Erin FullingKasa, Kurian, MD   100 mg at 11/05/18 0928  . famotidine (PEPCID) tablet 20 mg  20 mg Per Tube Daily Gardner CandleHallaji, Sheema M, RPH   20 mg at 11/05/18 0928  . gabapentin (NEURONTIN) capsule 300 mg  300 mg Oral Daily Uvaldo RisingSamaan, Maged, MD   300 mg at 11/05/18 0928  .  ipratropium-albuterol (DUONEB) 0.5-2.5 (3) MG/3ML nebulizer solution 3 mL  3 mL Nebulization Q4H Erin FullingKasa, Kurian, MD   3 mL at 11/05/18 0736  . LORazepam (ATIVAN) injection 1-2 mg  1-2 mg Intravenous Q4H PRN Migdalia Dkgan, Okoronkwo U, MD   2 mg at 11/01/18 1134  . MEDLINE mouth rinse  15 mL Mouth Rinse q12n4p Harlon DittyKeene, Jeremiah D, NP   15 mL at 11/03/18 1231  . multivitamin-lutein (OCUVITE-LUTEIN) capsule 1 capsule  1 capsule Oral Daily Uvaldo RisingSamaan, Maged, MD   1 capsule at 11/05/18 0928  . ondansetron (ZOFRAN) tablet 4 mg  4 mg Oral Q6H PRN Arnaldo Nataliamond, Michael S, MD       Or  . ondansetron [facility](ZOFRAN) injection 4 mg  4 mg Intravenous Q6H PRN Arnaldo Nataliamond, Michael S, MD      . phenol (CHLORASEPTIC) mouth spray 1 spray  1 spray Mouth/Throat PRN Samaan, Maged, MD      . sennosides (SENOKOT) 8.8 MG/5ML syrup 5 mL  5 mL Per Tube BID Uvaldo RisingSamaan, Maged, MD   5 mL at 11/05/18 0929  . sodium chloride flush (NS) 0.9 % injection 10-40 mL  10-40 mL Intracatheter Q12H Gouru, Aruna, MD   10 mL at 11/05/18 0934  . sodium chloride flush (NS) 0.9 % injection 10-40 mL  10-40 mL Intracatheter PRN Gouru, Aruna, MD   20 mL at 11/05/18 0600     Discharge Medications: Please see discharge summary for a list of discharge medications.  Relevant Imaging Results:  Relevant Lab Results:   Additional Information SSN [id number]  Darleene Cleavernterhaus, Amirrah Quigley R, [place]CSWA

## 2018-11-05 NOTE — Progress Notes (Signed)
PT Cancellation Note  Patient Details Name: Darrick HuntsmanSharon D Calise MRN: [id number] DOB: 06/09/1956   Cancelled Treatment:    Reason Eval/Treat Not Completed: Fatigue/lethargy limiting ability to participate.  Pt is too fatigued to get up and asked PT to come back at another time.  Will follow up as time and pt allow.   Ivar DrapeRuth E Harlean Regula 11/05/2018, 10:36 AM   Samul Dadauth Pranay Hilbun, PT MS Acute Rehab Dept. Number: [facility] [phone] and [facility] [phone]

## 2018-11-05 NOTE — Progress Notes (Signed)
  Speech Language Pathology Treatment: Dysphagia  Patient Details Name: MASEY SCHEIBER MRN: [id number] DOB: 1956/03/27 Today's Date: 11/05/2018 Time: 2979-8921 SLP Time Calculation (min) (ACUTE ONLY): 25 min  Assessment / Plan / Recommendation Clinical Impression  Pt continues to present with an oral pharyngeal dysphagia as characterized by poor ability to masticate trials of regular solids. Pt had large water pitcher with water via large straw upon ST entering room. SLP administered trials of thin via cup to assess safety with thin liquids. Pt had no overt ssx aspiration with thin liquids via cup. Pt was educated to not sue large straw in water pitcher due to increased risk of aspiration. Pt became weepy during session and stating that she cannot feed herself and everything is too hard. SLP educated pt that she will regain some skills with hard work. Pt able to return verbalize that she has to have thin via cup and not straw. Pt able to intake trials of dys 2 with thin without ssx aspiration and no complaints of foods/ liquids feeling 'stuck".      HPI HPI: HPI: (10/24/2018) The patient with past medical history of hypertension, hepatitis C and COPD presents emergency department complaining of headache.  The patient also complained of pain in her bottom.  Her husband reports the patient had been very weak today and had an episode of incontinence which prompted him to take her to call EMS for evaluation in the emergency department.  Patient was found to be febrile and met criteria for sepsis.  Due to her headache and wavering mental status emergency department performed a lumbar puncture after which the patient became hypoxic and was not protecting her airway.  She was subsequently intubated and placed on mechanical ventilation prior to the emergency department staff called the hospitalist service for admission.  The patient was extubated11/26/2019 AM.  Per infectious disease: Rt lung Pneumonia due to  pneumococcus /rare staph in the sputum.      SLP Plan  Continue with current plan of care       Recommendations  Diet recommendations: Dysphagia 2 (fine chop);Thin liquid Liquids provided via: Cup;No straw Medication Administration: Whole meds with puree Supervision: Staff to assist with self feeding Compensations: Minimize environmental distractions;Slow rate;Small sips/bites Postural Changes and/or Swallow Maneuvers: Seated upright 90 degrees;Out of bed for meals                Oral Care Recommendations: Oral care QID Follow up Recommendations: Other (comment) SLP Visit Diagnosis: Dysphagia, oropharyngeal phase (R13.12) Plan: Continue with current plan of care       Ashley 11/05/2018, 12:19 PM

## 2018-11-05 NOTE — Evaluation (Signed)
Occupational Therapy Evaluation Patient Details Name: Kylie HuntsmanSharon D Fenstermaker MRN: [id number] DOB: 05/31/1956 Today's Date: 11/05/2018    History of Present Illness presented to ER secondary to HA, progressive weakness; admitted for managment of septic shock related to R UL PNA and metabolic encephalopathy due to cocaine intoxication.  Hospital course significant for intubation 11/17-11/26; currently weaned to HFNC.   Clinical Impression   Pt is a 62 yo female [facility] with progressive weakness and referred for OT evaluation.  Patient lives with her fiancee in a mobile home and was previously independent with self care and homemaking tasks.  She now presents with muscle weakness, decreased mobility and decreased ability to perform self care tasks.  She is currently on 5L of O2 and fatigues easily with activity.  She reports new onset of weakness in her bilateral hands and is having difficulty maintaining grasp on items, dropping utensils and opening containers and packets on her food tray.  She reports this has improved since yesterday but still present.  Patient would benefit from skilled OT to maximize safety and independence in daily tasks.  She would likely benefit from short term rehab however she states she wants to return home.     Follow Up Recommendations  SNF(patient would like to go home)    Equipment Recommendations       Recommendations for Other Services       Precautions / Restrictions Precautions Precautions: Fall Restrictions Weight Bearing Restrictions: No      Mobility Bed Mobility Overal bed mobility: Needs Assistance Bed Mobility: Supine to Sit     Supine to sit: Min guard        Transfers Overall transfer level: Needs assistance Equipment used: Rolling walker (2 wheeled) Transfers: Sit to/from Stand Sit to Stand: Min assist              Balance Overall balance assessment: Needs assistance Sitting-balance support: No upper extremity  supported;Feet supported Sitting balance-Leahy Scale: Fair     Standing balance support: Bilateral upper extremity supported Standing balance-Leahy Scale: Fair                             ADL either performed or assessed with clinical judgement   ADL Overall ADL's : Needs assistance/impaired Eating/Feeding: Set up Eating/Feeding Details (indicate cue type and reason): Patient reports weakness in her hands, new onset and has difficulty with opening containers and packets, drops utensils frequently Grooming: Wash/dry hands;Wash/dry face;Modified independent;Sitting   Upper Body Bathing: Set up   Lower Body Bathing: Minimal assistance   Upper Body Dressing : Set up   Lower Body Dressing: Minimal assistance Lower Body Dressing Details (indicate cue type and reason): increased time allowed, attempted to don socks multiple trials without success, assist to get over her toes and she could pull them up the rest of the way Toilet Transfer: Minimal assistance   Toileting- Clothing Manipulation and Hygiene: Minimal assistance         General ADL Comments: Patient on 5L O2 this date and fatigues quickly with activity.       Vision         Perception     Praxis      Pertinent Vitals/Pain Pain Assessment: No/denies pain     Hand Dominance Right   Extremity/Trunk Assessment Upper Extremity Assessment Upper Extremity Assessment: Generalized weakness   Lower Extremity Assessment Lower Extremity Assessment: Defer to PT evaluation  Communication     Cognition Arousal/Alertness: Awake/alert Behavior During Therapy: WFL for tasks assessed/performed Overall Cognitive Status: Within Functional Limits for tasks assessed                                     General Comments   Patient seen for lower body dressing skills with min guard seated at the edge of the bed and cues for donning socks.     Exercises     Shoulder Instructions      Home  Living Family/patient expects to be discharged to:: Private residence Living Arrangements: Spouse/significant other Available Help at Discharge: Family Type of Home: Mobile home Home Access: Stairs to enter Entrance Stairs-Number of Steps: 5 Entrance Stairs-Rails: Right Home Layout: One level     Bathroom Shower/Tub: Walk-in Pensions consultant: Standard Bathroom Accessibility: Yes   Home Equipment: Cane - single point;Walker - 2 wheels;Shower seat          Prior Functioning/Environment Level of Independence: Independent with assistive device(s)        Comments: Mod indep with SPC for ADLs, household and limited community mobilization; denies home O2.        OT Problem List: Decreased strength;Impaired balance (sitting and/or standing);Cardiopulmonary status limiting activity;Decreased activity tolerance;Decreased coordination;Impaired UE functional use      OT Treatment/Interventions: Self-care/ADL training;DME and/or AE instruction;Therapeutic activities;Balance training;Therapeutic exercise;Neuromuscular education;Energy conservation;Patient/family education    OT Goals(Current goals can be found in the care plan section) Acute Rehab OT Goals Patient Stated Goal: go back home and be able to take care of myself OT Goal Formulation: With patient Time For Goal Achievement: 11/19/18 Potential to Achieve Goals: Good ADL Goals Pt Will Perform Lower Body Dressing: with modified independence Pt Will Transfer to Toilet: with modified independence  OT Frequency: Min 1X/week   Barriers to D/C:            Co-evaluation              AM-PAC OT "6 Clicks" Daily Activity     Outcome Measure Help from another person eating meals?: A Little Help from another person taking care of personal grooming?: A Little Help from another person toileting, which includes using toliet, bedpan, or urinal?: A Lot Help from another person bathing (including washing,  rinsing, drying)?: A Little Help from another person to put on and taking off regular upper body clothing?: A Little Help from another person to put on and taking off regular lower body clothing?: A Lot 6 Click Score: 16   End of Session Equipment Utilized During Treatment: Gait belt;Oxygen  Activity Tolerance: Patient tolerated treatment well Patient left: in bed;with call bell/phone within reach  OT Visit Diagnosis: Unsteadiness on feet (R26.81);Muscle weakness (generalized) (M62.81);Other (comment)(lack of coordination)                Time: 1610-9604 OT Time Calculation (min): 27 min Charges:  OT General Charges $OT Visit: 1 Visit OT Evaluation $OT Eval Low Complexity: 1 Low OT Treatments $Self Care/Home Management : 8-22 mins   T , OTR/L, CLT   , 11/05/2018, 2:15 PM

## 2018-11-06 LAB — GLUCOSE, CAPILLARY
Glucose-Capillary: 115 mg/dL — ABNORMAL HIGH (ref 70–99)
Glucose-Capillary: 107 mg/dL — ABNORMAL HIGH (ref 70–99)

## 2018-11-06 LAB — CALCIUM, IONIZED: Calcium, Ionized, Serum: 4.8 mg/dL (ref 4.5–5.6)

## 2018-11-06 MED ORDER — LORAZEPAM 0.5 MG PO TABS
0.5000 mg | ORAL_TABLET | Freq: Two times a day (BID) | ORAL | Status: DC | PRN
  Administered 2018-11-06 – 2018-11-07 (×2): 0.5 mg via ORAL
  Filled 2018-11-06 (×2): qty 1

## 2018-11-06 MED ORDER — IPRATROPIUM-ALBUTEROL 0.5-2.5 (3) MG/3ML IN SOLN
3.0000 mL | RESPIRATORY_TRACT | Status: DC
  Administered 2018-11-07: 3 mL via RESPIRATORY_TRACT
  Filled 2018-11-06: qty 3

## 2018-11-06 NOTE — Progress Notes (Signed)
[employer] [facility] [facility] [place] [facility]   PATIENT NAME: Kylie Monroe    MR#:  [id number]  DATE OF BIRTH:  21-Apr-1956  SUBJECTIVE:   On 5 L O2. Anxious  REVIEW OF SYSTEMS:    Review of Systems  Constitutional: Negative for fever, chills weight loss. Fatigue HENT: Negative for ear pain, nosebleeds, congestion, facial swelling, rhinorrhea, neck pain, neck stiffness and ear discharge.   Respiratory:++ for cough, shortness of breath, wheezing  Cardiovascular: Negative for chest pain, palpitations and leg swelling.  Gastrointestinal: Negative for heartburn, abdominal pain, vomiting, diarrhea or consitpation Genitourinary: Negative for dysuria, urgency, frequency, hematuria Musculoskeletal: Generalized weakness Neurological: Negative for dizziness, seizures, syncope, focal weakness,  numbness and headaches.  Hematological: Does not bruise/bleed easily.  Psychiatric/Behavioral: Negative for hallucinations, confusion, dysphoric mood  Tolerating Diet:yes  DRUG ALLERGIES:  No Known Allergies  VITALS:  Blood pressure (!) 93/58, pulse 79, temperature 97.8 F (36.6 C), temperature source Oral, resp. rate 19, height 5\' 2"  (1.575 m), weight 52.2 kg, SpO2 98 %.  PHYSICAL EXAMINATION:  Constitutional: Appears thin and frail on high flow nasal cannula.  HENT: Normocephalic. Marland Kitchen Oropharynx is clear and moist.  Eyes: Conjunctivae and EOM are normal. PERRLA, no scleral icterus.  Neck: Normal ROM. Neck supple. No JVD. No tracheal deviation. CVS: RRR, S1/S2 +, no murmurs, no gallops, no carotid bruit.  Pulmonary: Decreased breath sounds with bilateral wheezing  abdominal: Soft. BS +,  no distension, tenderness, rebound or guarding.  Musculoskeletal: Normal range of motion. No edema and no tenderness.  Arthritic changes in hands Neuro: Alert. CN 2-12 grossly intact. No focal deficits. Skin: Skin is warm and dry. No rash noted. Psychiatric: Anxious   LABORATORY PANEL:    CBC Recent Labs  Lab 11/05/18 0605  WBC 3.8*  HGB 9.9*  HCT 30.9*  PLT 160   ------------------------------------------------------------------------------------------------------------------  Chemistries  Recent Labs  Lab 11/03/18 0446  11/05/18 0605  NA 137   < > 135  K 3.5   < > 3.8  CL 95*   < > 95*  CO2 37*   < > 32  GLUCOSE 96   < > 116*  BUN 21*   < > 16  CREATININE <0.30*   < > <0.30*  CALCIUM 8.0*   < > 8.2*  MG 1.8   < > 2.0  AST 29  --   --   ALT 17  --   --   ALKPHOS 66  --   --   BILITOT 1.1  --   --    < > = values in this interval not displayed.   ------------------------------------------------------------------------------------------------------------------  Cardiac Enzymes No results for input(s): TROPONINI in the last 168 hours. ------------------------------------------------------------------------------------------------------------------  RADIOLOGY:  No results found.   ASSESSMENT AND PLAN:   62 year old female with history of COPD and heroin abuse on daily Suboxone who was brought in on November 17 with confusion and respiratory failure.   *  Acute hypoxic respiratory failure Needed  intubation and extubated on 11/26. Continues to be on HFNC 45% and needs inpatient care Slowly improving Today on 5 L o2  *  Streptococcus pneumonia Right lung pneumonia and bacteremia ID consultation appreciated Echo showed no vegetation Continue IV ceftriaxone through December 2 as per ID recommendations.  *  E. coli UTI Sensitive to Rocephin.  *  Acute encephalopathy with cocaine intoxication: Patient is status post lumbar puncture on admission.  LP was essentially unremarkable. Continue sublingual Subutex due to history of  heroin abuse  *  Thrombocytopenia from bacteremia has resolved  *  Acute on chronic anemia: Patient status post 1 unit PRBC. stable  *  Nonsevere malnutrition in context of chronic illness:  Continue nutritional  supplements  Continues to be on 5 L oxygen and needs inpatient care.   Likely d/c in AM with oxygen and home health  Management plans discussed with the patient and husband and they are in agreement.  CODE STATUS: Full  TOTAL TIME TAKING CARE OF THIS PATIENT: 30 minutes.   Molinda BailiffSrikar R Joevanni Roddey M.D on 11/06/2018 at 9:44 AM  Between 7am to 6pm - Pager - [phone]  After 6pm go to [email or web address] - Social research officer, governmentpassword EPAS [facility]  [facility] [place] [facility]  Office  [phone]  CC: Primary care physician; Donaciano EvaWoodruff, Sarah, FNP  Note: This dictation was prepared with Dragon dictation along with smaller phrase technology. Any transcriptional errors that result from this process are unintentional.

## 2018-11-07 LAB — GLUCOSE, CAPILLARY
Glucose-Capillary: 105 mg/dL — ABNORMAL HIGH (ref 70–99)
Glucose-Capillary: 107 mg/dL — ABNORMAL HIGH (ref 70–99)

## 2018-11-07 MED ORDER — LORAZEPAM 0.5 MG PO TABS: 0.5000 mg | ORAL_TABLET | Freq: Two times a day (BID) | ORAL | 0 refills | Status: DC | PRN

## 2018-11-07 NOTE — Care Management Note (Signed)
Case Management Note  Patient Details  Name: Kylie HuntsmanSharon D Wiltse MRN: [id number] Date of Birth: 02/14/1956  Subjective/Objective:   Patient to be discharged per MD order. Orders in place for home health services. Patient prefers to use [facility]. Referral placed with Brad who agrees to accept patient for services.Patient also need DME oxygen. Provided tank at bedside from [facility]. Patient asking about DME wheelchair, instructed patient to check at [facility] store, address provided. Fiance to transport.                   Action/Plan:   Expected Discharge Date:  11/07/18               Expected Discharge Plan:  Home w Home Health Services  In-House Referral:     Discharge planning Services  CM Consult  Post Acute Care Choice:  Home Health, Durable Medical Equipment Choice offered to:  Patient  DME Arranged:  Oxygen DME Agency:  [facility]  HH Arranged:  RN, [facility]  [facility]  Status of Service:  Completed, signed off  If discussed at Long Length of Stay Meetings, dates discussed:    Additional Comments:  Edwinna AreolaJosh A Naif Alabi, RN 11/07/2018, 3:10 PM

## 2018-11-07 NOTE — Progress Notes (Signed)
Pt discharged to home in stable condition, Home O2 at bedside for family to transport, all discharge instructions reviewed with and given to pt.  All belongings removed by pt's family.  Pt transported via wheelchair with transporters x1 at chairside.  AKing BSNRN

## 2018-11-07 NOTE — Discharge Instructions (Signed)
Resume diet and activity as before ° ° °

## 2018-11-07 NOTE — Plan of Care (Signed)
Patient removes nasal cannula, patient is 85% on room air. Educated patient about the oxygen treatment. Patient has frequent requests for staff members. Provide education when a caregiver is available.

## 2018-11-07 NOTE — Progress Notes (Signed)
SATURATION QUALIFICATIONS: (This note is used to comply with regulatory documentation for home oxygen)  Patient Saturations on Room Air at Rest = 85%  Patient Saturations on Room Air while Ambulating =   Patient Saturations on 3Liters of oxygen while Ambulating = 94%  Please briefly explain why patient needs home oxygen:

## 2018-11-08 LAB — GLUCOSE, CAPILLARY
Glucose-Capillary: 86 mg/dL (ref 70–99)
Glucose-Capillary: 140 mg/dL — ABNORMAL HIGH (ref 70–99)
Glucose-Capillary: 144 mg/dL — ABNORMAL HIGH (ref 70–99)

## 2018-11-12 ENCOUNTER — Telehealth: Payer: Self-pay

## 2018-11-12 NOTE — Telephone Encounter (Signed)
Flagged on EMMI report for being unsure if she received her discharge papers.  Called and spoke with patient. She mentioned she received them, however lost them. Requested another copy as she was unable to have her prescriptions written for at her PCP appointment today without that information.  Confirmed address to send to.  No further questions or concerns at this time.  I thanked her for her time and informed her she would receive one more automated call in the next few days checking in.

## 2018-11-17 NOTE — Discharge Summary (Signed)
[facility] [place] NAME: Kylie Monroe    MR#:  [id number]  DATE OF BIRTH:  May 18, 1956  DATE OF ADMISSION:  10/24/2018 ADMITTING PHYSICIAN: Harrie Foreman, MD  DATE OF DISCHARGE: 11/07/2018  4:19 PM  PRIMARY CARE PHYSICIAN: Maryanna Shape, FNP   ADMISSION DIAGNOSIS:  Septic shock ([place]) [A41.9, R65.21]  DISCHARGE DIAGNOSIS:  Active Problems:   Acute respiratory failure with hypoxia (HCC)   Pneumonia   Septic shock (HCC)   Malnutrition of moderate degree   DNR (do not resuscitate) discussion   Palliative care by specialist   Pressure injury of skin   Substance abuse ([place])   SECONDARY DIAGNOSIS:   Past Medical History:  Diagnosis Date  . COPD (chronic obstructive pulmonary disease) ([place])   . Hepatitis C   . Hypertension      ADMITTING HISTORY  Chief Complaint: Headache HPI: The patient with past medical history of hypertension, hepatitis C and COPD presents emergency department complaining of headache.  The patient also complained of pain in her bottom.  Her husband reports the patient had been very weak today and had an episode of incontinence which prompted him to take her to call EMS for evaluation in the emergency department.  Patient was found to be febrile and met criteria for sepsis.  Due to her headache and wavering mental status emergency department performed a lumbar puncture after which the patient became hypoxic and was not protecting her airway.  She was subsequently intubated and placed on mechanical ventilation prior to the emergency department staff called the hospitalist service for admission.  HOSPITAL COURSE:   62 year old female with history of COPD and heroin abuse on daily Suboxone who was brought in on November 17 with confusion and respiratory failure.   *  Acute hypoxic respiratory failure Needed  intubation and extubated on 11/26. Patient after being extubated in the ICU was transferred to medical  floor.  Was on high flow nasal cannula initially and slowly transition down to 2 L oxygen which she will be discharged home on.  *  Streptococcus pneumonia Right lung pneumonia and bacteremia ID consultation appreciated Echo showed no vegetation Patient finished her course of IV ceftriaxone in the hospital and was discharged home.  No antibiotics at discharge.  *  E. coli UTI Sensitive to Rocephin  *  Acute encephalopathy with cocaine intoxication: Patient is status post lumbar puncture on admission.  LP was essentially unremarkable. resolved  *  Thrombocytopenia from bacteremia has resolved  *  Acute on chronic anemia: Patient status post 1 unit PRBC. Hb stable at 10.  *  Nonsevere malnutrition in context of chronic illness:  Continue nutritional supplements  Home health setup at discharge.  CONSULTS OBTAINED:  Treatment Team:  Tsosie Billing, MD [facility], [facility], MD  DRUG ALLERGIES:  No Known Allergies  DISCHARGE MEDICATIONS:   Allergies as of 11/07/2018   No Known Allergies     Medication List    STOP taking these medications   ALPRAZolam 0.5 MG tablet Commonly known as:  XANAX     TAKE these medications   aspirin 81 MG chewable tablet Chew 81 mg by mouth daily.   bisacodyl 5 MG EC tablet Commonly known as:  DULCOLAX Take 5 mg by mouth daily.   FLOVENT HFA 220 MCG/ACT inhaler Generic drug:  fluticasone Inhale 220 mcg into the lungs 2 (two) times daily.   Fluticasone-Salmeterol 250-50 MCG/DOSE Aepb Commonly known as:  ADVAIR Inhale 1 puff  into the lungs 2 (two) times daily.   gabapentin 300 MG capsule Commonly known as:  NEURONTIN Take 300 mg by mouth daily.   LORazepam 0.5 MG tablet Commonly known as:  ATIVAN Take 1 tablet (0.5 mg total) by mouth 2 (two) times daily as needed for anxiety or sleep.   PROAIR HFA 108 (90 Base) MCG/ACT inhaler Generic drug:  albuterol Inhale 90 mcg into the lungs as needed.   SUBOXONE 8-2 MG  Film Generic drug:  Buprenorphine HCl-Naloxone HCl Take 8.5 Film by mouth 3 (three) times daily.       Today   VITAL SIGNS:  Blood pressure 98/65, pulse 79, temperature 98.6 F (37 C), temperature source Oral, resp. rate 18, height 5' 2"  (1.575 m), weight 52.4 kg, SpO2 94 %.  I/O:  No intake or output data in the 24 hours ending 11/17/18 1442  PHYSICAL EXAMINATION:  Physical Exam  GENERAL:  62 y.o.-year-old patient lying in the bed with no acute distress.  LUNGS: Normal breath sounds bilaterally, no wheezing, rales,rhonchi or crepitation. No use of accessory muscles of respiration.  CARDIOVASCULAR: S1, S2 normal. No murmurs, rubs, or gallops.  ABDOMEN: Soft, non-tender, non-distended. Bowel sounds present. No organomegaly or mass.  NEUROLOGIC: Moves all 4 extremities. PSYCHIATRIC: The patient is alert and oriented x 3.  SKIN: No obvious rash, lesion, or ulcer.   DATA REVIEW:   CBC No results for input(s): WBC, HGB, HCT, PLT in the last 168 hours.  Chemistries  No results for input(s): NA, K, CL, CO2, GLUCOSE, BUN, CREATININE, CALCIUM, MG, AST, ALT, ALKPHOS, BILITOT in the last 168 hours.  Invalid input(s): GFRCGP  Cardiac Enzymes No results for input(s): TROPONINI in the last 168 hours.  Microbiology Results  Results for orders placed or performed during the hospital encounter of 10/24/18  Urine culture     Status: Abnormal   Collection Time: 10/24/18  4:56 AM  Result Value Ref Range Status   Specimen Description   Final    URINE, RANDOM Performed at [facility], [address], [place], [place] [address]    Special Requests   Final    NONE Performed at [facility], [address] [place], [place] [address]    Culture >=100,000 COLONIES/mL ESCHERICHIA COLI (A)  Final   Report Status 10/26/2018 FINAL  Final   Organism ID, Bacteria ESCHERICHIA COLI (A)  Final      Susceptibility   Escherichia coli - MIC*    AMPICILLIN >=32 RESISTANT  Resistant     CEFAZOLIN 8 SENSITIVE Sensitive     CEFTRIAXONE <=1 SENSITIVE Sensitive     CIPROFLOXACIN >=4 RESISTANT Resistant     GENTAMICIN <=1 SENSITIVE Sensitive     IMIPENEM 0.5 SENSITIVE Sensitive     NITROFURANTOIN <=16 SENSITIVE Sensitive     TRIMETH/SULFA <=20 SENSITIVE Sensitive     AMPICILLIN/SULBACTAM >=32 RESISTANT Resistant     PIP/TAZO <=4 SENSITIVE Sensitive     Extended ESBL NEGATIVE Sensitive     * >=100,000 COLONIES/mL ESCHERICHIA COLI  Culture, blood (routine x 2)     Status: Abnormal   Collection Time: 10/24/18  5:12 AM  Result Value Ref Range Status   Specimen Description   Final    BLOOD RIGHT ANTECUBITAL Performed at [facility], [address] [place], [place] [address]    Special Requests   Final    BOTTLES DRAWN AEROBIC AND ANAEROBIC Blood Culture results may not be optimal due to an excessive volume of  blood received in culture bottles Performed at [facility], [address] [place], [place] [address]    Culture  Setup Time   Final    GRAM POSITIVE COCCI AEROBIC BOTTLE ONLY CRITICAL RESULT CALLED TO, READ BACK BY AND VERIFIED WITH: Winfield Rast PATEL @2118  10/24/18 AKT Performed at [facility], [place] [address], [place], [place] [address]    Culture STREPTOCOCCUS PNEUMONIAE (A)  Final   Report Status 10/27/2018 FINAL  Final   Organism ID, Bacteria STREPTOCOCCUS PNEUMONIAE  Final      Susceptibility   Streptococcus pneumoniae - MIC*    ERYTHROMYCIN <=0.12 SENSITIVE Sensitive     LEVOFLOXACIN 0.5 SENSITIVE Sensitive     VANCOMYCIN 0.5 SENSITIVE Sensitive     PENICILLIN (non-meningitis) <=0.06 SENSITIVE Sensitive     CEFTRIAXONE (non-meningitis) <=0.12 SENSITIVE Sensitive     * STREPTOCOCCUS PNEUMONIAE  Blood Culture ID Panel (Reflexed)     Status: Abnormal   Collection Time: 10/24/18  5:12 AM  Result Value Ref Range Status   Enterococcus species NOT DETECTED NOT DETECTED Final   Listeria monocytogenes NOT DETECTED NOT  DETECTED Final   Staphylococcus species NOT DETECTED NOT DETECTED Final   Staphylococcus aureus (BCID) NOT DETECTED NOT DETECTED Final   Streptococcus species DETECTED (A) NOT DETECTED Final    Comment: CRITICAL RESULT CALLED TO, READ BACK BY AND VERIFIED WITH: KISHAN PATEL @2118  10/24/18 AKT    Streptococcus agalactiae NOT DETECTED NOT DETECTED Final   Streptococcus pneumoniae DETECTED (A) NOT DETECTED Final    Comment: CRITICAL RESULT CALLED TO, READ BACK BY AND VERIFIED WITH: KISHAN PATEL @2118  10/24/18 AKT    Streptococcus pyogenes NOT DETECTED NOT DETECTED Final   Acinetobacter baumannii NOT DETECTED NOT DETECTED Final   Enterobacteriaceae species NOT DETECTED NOT DETECTED Final   Enterobacter cloacae complex NOT DETECTED NOT DETECTED Final   Escherichia coli NOT DETECTED NOT DETECTED Final   Klebsiella oxytoca NOT DETECTED NOT DETECTED Final   Klebsiella pneumoniae NOT DETECTED NOT DETECTED Final   Proteus species NOT DETECTED NOT DETECTED Final   Serratia marcescens NOT DETECTED NOT DETECTED Final   Haemophilus influenzae NOT DETECTED NOT DETECTED Final   Neisseria meningitidis NOT DETECTED NOT DETECTED Final   Pseudomonas aeruginosa NOT DETECTED NOT DETECTED Final   Candida albicans NOT DETECTED NOT DETECTED Final   Candida glabrata NOT DETECTED NOT DETECTED Final   Candida krusei NOT DETECTED NOT DETECTED Final   Candida parapsilosis NOT DETECTED NOT DETECTED Final   Candida tropicalis NOT DETECTED NOT DETECTED Final    Comment: Performed at [facility], [address] [place], [place] [address]  CSF culture     Status: None   Collection Time: 10/24/18  5:54 AM  Result Value Ref Range Status   Specimen Description   Final    CSF Performed at [facility], [address], [place], [place] [address]    Special Requests   Final    NONE Performed at [facility], [place], [place] [address]    Gram Stain   Final    NO  ORGANISMS SEEN RED BLOOD CELLS PRESENT WBC SEEN CYTOSPIN SMEAR    Culture   Final    NO GROWTH 3 DAYS Performed at [facility], [place] [address], [place], [place] [address]    Report Status 10/27/2018 FINAL  Final  MRSA PCR Screening     Status: None   Collection Time: 10/24/18  8:03 AM  Result  Value Ref Range Status   MRSA by PCR NEGATIVE NEGATIVE Final    Comment:        The GeneXpert MRSA Assay (FDA approved for NASAL specimens only), is one component of a comprehensive MRSA colonization surveillance program. It is not intended to diagnose MRSA infection nor to guide or monitor treatment for MRSA infections. Performed at [facility], [address] [place], [place] [address]   Expectorated sputum assessment w rflx to resp cult     Status: None   Collection Time: 10/24/18  8:39 AM  Result Value Ref Range Status   Specimen Description SPUTUM  Final   Special Requests NONE  Final   Sputum evaluation   Final    THIS SPECIMEN IS ACCEPTABLE FOR SPUTUM CULTURE ENDOTRACHEAL SPUEVA CREDITED WITH RETAINED RESULTS. [facility] 10/24/18 AT 5621 Performed at [facility], [address] [place], [place] [address]    Report Status 10/24/2018 FINAL  Final  Culture, respiratory     Status: None   Collection Time: 10/24/18  8:39 AM  Result Value Ref Range Status   Specimen Description   Final    SPUTUM Performed at [facility], [address], [place], [place] [address]    Special Requests   Final    NONE Reflexed from [id number] Performed at [facility], [address] [place], [place] [address]    Gram Stain   Final    FEW WBC PRESENT, PREDOMINANTLY PMN FEW GRAM POSITIVE COCCI Performed at [facility], [place] [address] [place], [place] [address]    Culture   Final    RARE STREPTOCOCCUS PNEUMONIAE RARE STAPHYLOCOCCUS AUREUS    Report Status 10/27/2018 FINAL  Final   Organism ID, Bacteria STAPHYLOCOCCUS AUREUS  Final    Organism ID, Bacteria STREPTOCOCCUS PNEUMONIAE  Final      Susceptibility   Staphylococcus aureus - MIC*    CIPROFLOXACIN <=0.5 SENSITIVE Sensitive     ERYTHROMYCIN <=0.25 SENSITIVE Sensitive     GENTAMICIN <=0.5 SENSITIVE Sensitive     OXACILLIN 0.5 SENSITIVE Sensitive     TETRACYCLINE <=1 SENSITIVE Sensitive     VANCOMYCIN <=0.5 SENSITIVE Sensitive     TRIMETH/SULFA <=10 SENSITIVE Sensitive     CLINDAMYCIN <=0.25 SENSITIVE Sensitive     RIFAMPIN <=0.5 SENSITIVE Sensitive     Inducible Clindamycin NEGATIVE Sensitive     * RARE STAPHYLOCOCCUS AUREUS   Streptococcus pneumoniae - MIC*    ERYTHROMYCIN <=0.12 SENSITIVE Sensitive     LEVOFLOXACIN 0.5 SENSITIVE Sensitive     VANCOMYCIN 0.5 SENSITIVE Sensitive     PENICILLIN (meningitis) <=0.06 SENSITIVE Sensitive     PENICILLIN (non-meningitis) <=0.06 SENSITIVE Sensitive     CEFTRIAXONE (non-meningitis) <=0.12 SENSITIVE Sensitive     CEFTRIAXONE (meningitis) <=0.12 SENSITIVE Sensitive     * RARE STREPTOCOCCUS PNEUMONIAE  CULTURE, BLOOD (ROUTINE X 2) w Reflex to ID Panel     Status: None   Collection Time: 10/27/18  1:01 PM  Result Value Ref Range Status   Specimen Description BLOOD BLOOD RIGHT HAND  Final   Special Requests   Final    BOTTLES DRAWN AEROBIC AND ANAEROBIC Blood Culture adequate volume   Culture   Final    NO GROWTH 5 DAYS Performed at [facility], [address] [place],  [address]    Report Status 11/01/2018 FINAL  Final  CULTURE, BLOOD (ROUTINE X 2) w Reflex to ID Panel     Status: None   Collection  Time: 10/27/18  2:07 PM  Result Value Ref Range Status   Specimen Description BLOOD BLOOD RIGHT HAND  Final   Special Requests   Final    BOTTLES DRAWN AEROBIC AND ANAEROBIC Blood Culture adequate volume   Culture   Final    NO GROWTH 5 DAYS Performed at [facility], [address] [place], [place] [address]    Report Status 11/01/2018 FINAL  Final  Culture, respiratory  (non-expectorated)     Status: None   Collection Time: 10/27/18  6:31 PM  Result Value Ref Range Status   Specimen Description   Final    TRACHEAL ASPIRATE Performed at [facility], [address] [place] [address], [place], [place] [address]    Special Requests   Final    NONE Performed at [facility], [address] [place], [place] [address]    Gram Stain   Final    MODERATE WBC PRESENT, PREDOMINANTLY MONONUCLEAR NO ORGANISMS SEEN    Culture   Final    NO GROWTH Performed at [place] [address] [place], [place] [address]    Report Status 10/30/2018 FINAL  Final  Urine Culture     Status: None   Collection Time: 10/28/18  2:07 AM  Result Value Ref Range Status   Specimen Description   Final    URINE, CATHETERIZED Performed at [facility], [address], [place], [place] [address]    Special Requests   Final    NONE Performed at [facility], [address] [place], [place] [address]    Culture   Final    NO GROWTH Performed at [facility], [place] [address], [place], [place] [address]    Report Status 10/29/2018 FINAL  Final  Culture, respiratory (non-expectorated)     Status: None   Collection Time: 10/29/18  6:01 PM  Result Value Ref Range Status   Specimen Description   Final    BRONCHIAL ALVEOLAR LAVAGE Performed at [facility], [address] [place], [place] [address]    Special Requests RIGHT  Final   Gram Stain   Final    ABUNDANT WBC PRESENT,BOTH PMN AND MONONUCLEAR NO ORGANISMS SEEN    Culture   Final    RARE Consistent with normal respiratory flora. Performed at [facility], [place] [address], [place], [place] [address]    Report Status 11/01/2018 FINAL  Final  Culture, respiratory     Status: None   Collection Time: 10/29/18  6:01 PM  Result Value Ref Range Status   Specimen Description BRONCHIAL ALVEOLAR LAVAGE  Final   Special Requests LEFT  Final   Gram Stain   Final    ABUNDANT  WBC PRESENT, PREDOMINANTLY PMN NO ORGANISMS SEEN    Culture   Final    RARE Consistent with normal respiratory flora. Performed at [facility], [place] [address] [place], [place] [address]    Report Status 11/01/2018 FINAL  Final    RADIOLOGY:  No results found.  Follow up with PCP in 1 week.  Management plans discussed with the patient, family and they are in agreement.  CODE STATUS:  Code Status History    Date Active Date Inactive Code Status Order ID Comments User Context   10/24/2018 0816 11/07/2018 1925 Full Code [id number]  Harrie Foreman, MD Inpatient      TOTAL TIME TAKING CARE OF THIS PATIENT ON DAY OF DISCHARGE: more than 30 minutes.  Leia Alf Stacie Knutzen M.D on 11/17/2018 at 2:42 PM  Between 7am to 6pm - Pager - [phone]  After 6pm go to [email or web address] - password [email or web address] [facility]  Office  [phone]  CC: Primary care physician; Maryanna Shape, FNP  Note: This dictation was prepared with Dragon dictation along with smaller phrase technology. Any transcriptional errors that result from this process are unintentional.

## 2019-01-10 ENCOUNTER — Other Ambulatory Visit: Payer: Self-pay | Admitting: Registered Nurse

## 2019-01-10 DIAGNOSIS — Z1231 Encounter for screening mammogram for malignant neoplasm of breast: Secondary | ICD-10-CM

## 2019-02-07 ENCOUNTER — Other Ambulatory Visit (HOSPITAL_COMMUNITY): Payer: Self-pay | Admitting: Registered Nurse

## 2019-02-07 ENCOUNTER — Other Ambulatory Visit: Payer: Self-pay | Admitting: Registered Nurse

## 2019-02-07 DIAGNOSIS — K7460 Unspecified cirrhosis of liver: Secondary | ICD-10-CM

## 2019-02-11 ENCOUNTER — Ambulatory Visit: Payer: Medicaid Other

## 2019-02-15 ENCOUNTER — Inpatient Hospital Stay
Admission: EM | Admit: 2019-02-15 | Discharge: 2019-02-24 | DRG: 871 | Disposition: A | Discharge status: Home Health | Payer: Medicaid Other | Attending: Family Medicine | Admitting: Family Medicine

## 2019-02-15 ENCOUNTER — Emergency Department: Payer: Medicaid Other

## 2019-02-15 ENCOUNTER — Inpatient Hospital Stay: Payer: Medicaid Other

## 2019-02-15 ENCOUNTER — Encounter: Payer: Self-pay | Admitting: Emergency Medicine

## 2019-02-15 DIAGNOSIS — Z7151 Drug abuse counseling and surveillance of drug abuser: Secondary | ICD-10-CM | POA: Diagnosis not present

## 2019-02-15 DIAGNOSIS — F1410 Cocaine abuse, uncomplicated: Secondary | ICD-10-CM | POA: Diagnosis present

## 2019-02-15 DIAGNOSIS — J440 Chronic obstructive pulmonary disease with acute lower respiratory infection: Secondary | ICD-10-CM | POA: Diagnosis present

## 2019-02-15 DIAGNOSIS — F17210 Nicotine dependence, cigarettes, uncomplicated: Secondary | ICD-10-CM | POA: Diagnosis present

## 2019-02-15 DIAGNOSIS — J189 Pneumonia, unspecified organism: Secondary | ICD-10-CM

## 2019-02-15 DIAGNOSIS — Z6827 Body mass index (BMI) 27.0-27.9, adult: Secondary | ICD-10-CM

## 2019-02-15 DIAGNOSIS — R6521 Severe sepsis with septic shock: Secondary | ICD-10-CM | POA: Diagnosis present

## 2019-02-15 DIAGNOSIS — Z7951 Long term (current) use of inhaled steroids: Secondary | ICD-10-CM

## 2019-02-15 DIAGNOSIS — K7460 Unspecified cirrhosis of liver: Secondary | ICD-10-CM | POA: Diagnosis present

## 2019-02-15 DIAGNOSIS — E876 Hypokalemia: Secondary | ICD-10-CM | POA: Diagnosis present

## 2019-02-15 DIAGNOSIS — N179 Acute kidney failure, unspecified: Secondary | ICD-10-CM | POA: Diagnosis present

## 2019-02-15 DIAGNOSIS — E870 Hyperosmolality and hypernatremia: Secondary | ICD-10-CM | POA: Diagnosis present

## 2019-02-15 DIAGNOSIS — E43 Unspecified severe protein-calorie malnutrition: Secondary | ICD-10-CM | POA: Diagnosis present

## 2019-02-15 DIAGNOSIS — J9601 Acute respiratory failure with hypoxia: Secondary | ICD-10-CM | POA: Diagnosis not present

## 2019-02-15 DIAGNOSIS — Z01818 Encounter for other preprocedural examination: Secondary | ICD-10-CM

## 2019-02-15 DIAGNOSIS — E875 Hyperkalemia: Secondary | ICD-10-CM | POA: Diagnosis present

## 2019-02-15 DIAGNOSIS — Z79899 Other long term (current) drug therapy: Secondary | ICD-10-CM

## 2019-02-15 DIAGNOSIS — B1920 Unspecified viral hepatitis C without hepatic coma: Secondary | ICD-10-CM | POA: Diagnosis present

## 2019-02-15 DIAGNOSIS — G9341 Metabolic encephalopathy: Secondary | ICD-10-CM | POA: Diagnosis present

## 2019-02-15 DIAGNOSIS — A419 Sepsis, unspecified organism: Secondary | ICD-10-CM

## 2019-02-15 DIAGNOSIS — A403 Sepsis due to Streptococcus pneumoniae: Principal | ICD-10-CM | POA: Diagnosis present

## 2019-02-15 DIAGNOSIS — F1110 Opioid abuse, uncomplicated: Secondary | ICD-10-CM | POA: Diagnosis present

## 2019-02-15 DIAGNOSIS — I10 Essential (primary) hypertension: Secondary | ICD-10-CM | POA: Diagnosis present

## 2019-02-15 DIAGNOSIS — J9690 Respiratory failure, unspecified, unspecified whether with hypoxia or hypercapnia: Secondary | ICD-10-CM

## 2019-02-15 DIAGNOSIS — R451 Restlessness and agitation: Secondary | ICD-10-CM | POA: Diagnosis not present

## 2019-02-15 DIAGNOSIS — Z7982 Long term (current) use of aspirin: Secondary | ICD-10-CM | POA: Diagnosis not present

## 2019-02-15 DIAGNOSIS — F05 Delirium due to known physiological condition: Secondary | ICD-10-CM | POA: Diagnosis present

## 2019-02-15 DIAGNOSIS — J13 Pneumonia due to Streptococcus pneumoniae: Secondary | ICD-10-CM | POA: Diagnosis present

## 2019-02-15 DIAGNOSIS — J9621 Acute and chronic respiratory failure with hypoxia: Secondary | ICD-10-CM | POA: Diagnosis present

## 2019-02-15 DIAGNOSIS — D6959 Other secondary thrombocytopenia: Secondary | ICD-10-CM | POA: Diagnosis present

## 2019-02-15 DIAGNOSIS — Y95 Nosocomial condition: Secondary | ICD-10-CM | POA: Diagnosis present

## 2019-02-15 DIAGNOSIS — G9340 Encephalopathy, unspecified: Secondary | ICD-10-CM | POA: Diagnosis not present

## 2019-02-15 DIAGNOSIS — Z9049 Acquired absence of other specified parts of digestive tract: Secondary | ICD-10-CM | POA: Diagnosis not present

## 2019-02-15 DIAGNOSIS — F1199 Opioid use, unspecified with unspecified opioid-induced disorder: Secondary | ICD-10-CM | POA: Diagnosis not present

## 2019-02-15 DIAGNOSIS — R410 Disorientation, unspecified: Secondary | ICD-10-CM | POA: Diagnosis not present

## 2019-02-15 DIAGNOSIS — J9600 Acute respiratory failure, unspecified whether with hypoxia or hypercapnia: Secondary | ICD-10-CM

## 2019-02-15 DIAGNOSIS — F1910 Other psychoactive substance abuse, uncomplicated: Secondary | ICD-10-CM | POA: Diagnosis not present

## 2019-02-15 LAB — TROPONIN I: Troponin I: <0.03 ng/mL (ref <0.03)

## 2019-02-15 LAB — URINALYSIS, COMPLETE (UACMP) WITH MICROSCOPIC
Bacteria, UA: NONE SEEN
Bilirubin Urine: NEGATIVE
Glucose, UA: NEGATIVE mg/dL
Ketones, ur: NEGATIVE mg/dL
Leukocytes,Ua: NEGATIVE
NITRITE: NEGATIVE
PH: 5 (ref 5.0–8.0)
Protein, ur: 30 mg/dL — AB
SPECIFIC GRAVITY, URINE: 1.021 (ref 1.005–1.030)

## 2019-02-15 LAB — INFLUENZA PANEL BY PCR (TYPE A & B)
Influenza A By PCR: NEGATIVE
Influenza A By PCR: NEGATIVE
Influenza B By PCR: NEGATIVE
Influenza B By PCR: NEGATIVE

## 2019-02-15 LAB — CBC
HCT: 36.1 % (ref 36.0–46.0)
Hemoglobin: 11.6 g/dL — ABNORMAL LOW (ref 12.0–15.0)
MCH: 29.4 pg (ref 26.0–34.0)
MCHC: 32.1 g/dL (ref 30.0–36.0)
MCV: 91.6 fL (ref 80.0–100.0)
Platelets: 81 10*3/uL — ABNORMAL LOW (ref 150–400)
RBC: 3.94 MIL/uL (ref 3.87–5.11)
RDW: 15.9 % — AB (ref 11.5–15.5)
WBC: 7.9 10*3/uL (ref 4.0–10.5)
nRBC: 0 % (ref 0.0–0.2)

## 2019-02-15 LAB — BLOOD CULTURE ID PANEL (REFLEXED)
Acinetobacter baumannii: NOT DETECTED
Candida albicans: NOT DETECTED
Candida glabrata: NOT DETECTED
Candida krusei: NOT DETECTED
Candida parapsilosis: NOT DETECTED
Candida tropicalis: NOT DETECTED
Enterobacter cloacae complex: NOT DETECTED
Enterobacteriaceae species: NOT DETECTED
Enterococcus species: NOT DETECTED
Escherichia coli: NOT DETECTED
Haemophilus influenzae: NOT DETECTED
Klebsiella oxytoca: NOT DETECTED
Klebsiella pneumoniae: NOT DETECTED
Listeria monocytogenes: NOT DETECTED
Neisseria meningitidis: NOT DETECTED
PROTEUS SPECIES: NOT DETECTED
PSEUDOMONAS AERUGINOSA: NOT DETECTED
STREPTOCOCCUS AGALACTIAE: NOT DETECTED
Serratia marcescens: NOT DETECTED
Staphylococcus aureus (BCID): NOT DETECTED
Staphylococcus species: NOT DETECTED
Streptococcus pneumoniae: DETECTED — AB
Streptococcus pyogenes: NOT DETECTED
Streptococcus species: DETECTED — AB

## 2019-02-15 LAB — BLOOD GAS, VENOUS
Acid-Base Excess: 2.2 mmol/L — ABNORMAL HIGH (ref 0.0–2.0)
Bicarbonate: 29.1 mmol/L — ABNORMAL HIGH (ref 20.0–28.0)
O2 Saturation: 90.3 %
Patient temperature: 37
pCO2, Ven: 54 mmHg (ref 44.0–60.0)
pH, Ven: 7.34 (ref 7.250–7.430)
pO2, Ven: 63 mmHg — ABNORMAL HIGH (ref 32.0–45.0)

## 2019-02-15 LAB — URINE DRUG SCREEN, QUALITATIVE (ARMC ONLY)
Amphetamines, Ur Screen: NOT DETECTED
BARBITURATES, UR SCREEN: NOT DETECTED
BENZODIAZEPINE, UR SCRN: POSITIVE — AB
Cannabinoid 50 Ng, Ur ~~LOC~~: NOT DETECTED
Cocaine Metabolite,Ur ~~LOC~~: POSITIVE — AB
MDMA (Ecstasy)Ur Screen: NOT DETECTED
Methadone Scn, Ur: NOT DETECTED
Opiate, Ur Screen: POSITIVE — AB
Phencyclidine (PCP) Ur S: NOT DETECTED
Tricyclic, Ur Screen: NOT DETECTED

## 2019-02-15 LAB — BASIC METABOLIC PANEL
Anion gap: 8 (ref 5–15)
BUN: 27 mg/dL — ABNORMAL HIGH (ref 6–20)
CO2: 28 mmol/L (ref 22–32)
CREATININE: 0.7 mg/dL (ref 0.44–1.00)
Calcium: 8.5 mg/dL — ABNORMAL LOW (ref 8.9–10.3)
Chloride: 96 mmol/L — ABNORMAL LOW (ref 98–111)
GFR calc Af Amer: >60 mL/min (ref >60)
GFR calc non Af Amer: >60 mL/min (ref >60)
Glucose, Bld: 115 mg/dL — ABNORMAL HIGH (ref 70–99)
Potassium: 3.4 mmol/L — ABNORMAL LOW (ref 3.5–5.1)
Sodium: 132 mmol/L — ABNORMAL LOW (ref 135–145)

## 2019-02-15 LAB — LACTIC ACID, PLASMA
LACTIC ACID, VENOUS: 1.7 mmol/L (ref 0.5–1.9)
Lactic Acid, Venous: 2.1 mmol/L (ref 0.5–1.9)

## 2019-02-15 LAB — STREP PNEUMONIAE URINARY ANTIGEN: Strep Pneumo Urinary Antigen: POSITIVE — AB

## 2019-02-15 LAB — PROCALCITONIN: Procalcitonin: 4.64 ng/mL

## 2019-02-15 LAB — TRIGLYCERIDES: Triglycerides: 197 mg/dL — ABNORMAL HIGH (ref <150)

## 2019-02-15 LAB — GLUCOSE, CAPILLARY: Glucose-Capillary: 114 mg/dL — ABNORMAL HIGH (ref 70–99)

## 2019-02-15 LAB — MRSA PCR SCREENING: MRSA by PCR: NEGATIVE

## 2019-02-15 MED ORDER — LACTATED RINGERS IV BOLUS
500.0000 mL | Freq: Once | INTRAVENOUS | Status: AC
  Administered 2019-02-15: 500 mL via INTRAVENOUS

## 2019-02-15 MED ORDER — LORAZEPAM 2 MG/ML IJ SOLN
1.0000 mg | Freq: Once | INTRAMUSCULAR | Status: AC
  Administered 2019-02-15: 1 mg via INTRAVENOUS

## 2019-02-15 MED ORDER — LORAZEPAM 2 MG/ML IJ SOLN
4.0000 mg | Freq: Once | INTRAMUSCULAR | Status: AC
  Administered 2019-02-15: 4 mg via INTRAVENOUS

## 2019-02-15 MED ORDER — NOREPINEPHRINE 16 MG/250ML-% IV SOLN
0.0000 ug/min | INTRAVENOUS | Status: DC
  Administered 2019-02-15: 20 ug/min via INTRAVENOUS
  Administered 2019-02-16: 5 ug/min via INTRAVENOUS
  Filled 2019-02-15 (×2): qty 250

## 2019-02-15 MED ORDER — HYDROCORTISONE NA SUCCINATE PF 100 MG IJ SOLR
50.0000 mg | Freq: Four times a day (QID) | INTRAMUSCULAR | Status: DC
  Administered 2019-02-15 – 2019-02-16 (×3): 50 mg via INTRAVENOUS
  Filled 2019-02-15 (×3): qty 2

## 2019-02-15 MED ORDER — MIDAZOLAM HCL 2 MG/2ML IJ SOLN
INTRAMUSCULAR | Status: AC
  Administered 2019-02-15: 2 mg via INTRAVENOUS
  Filled 2019-02-15: qty 2

## 2019-02-15 MED ORDER — LORAZEPAM 2 MG/ML IJ SOLN
1.0000 mg | INTRAMUSCULAR | Status: DC | PRN
  Administered 2019-02-15 (×3): 2 mg via INTRAVENOUS
  Filled 2019-02-15 (×3): qty 1

## 2019-02-15 MED ORDER — SODIUM CHLORIDE 0.9 % IV BOLUS (SEPSIS)
1000.0000 mL | Freq: Once | INTRAVENOUS | Status: AC
  Administered 2019-02-15: 1000 mL via INTRAVENOUS

## 2019-02-15 MED ORDER — SODIUM CHLORIDE 0.9 % IV SOLN
2.0000 g | Freq: Once | INTRAVENOUS | Status: AC
  Administered 2019-02-15: 2 g via INTRAVENOUS
  Filled 2019-02-15: qty 2

## 2019-02-15 MED ORDER — METHADONE HCL 10 MG PO TABS: 20.0000 mg | ORAL_TABLET | Freq: Two times a day (BID) | ORAL | Status: DC

## 2019-02-15 MED ORDER — MIDAZOLAM HCL 2 MG/2ML IJ SOLN: 2.0000 mg | INTRAMUSCULAR | Status: DC | PRN

## 2019-02-15 MED ORDER — LORAZEPAM 2 MG/ML IJ SOLN
1.0000 mg | Freq: Once | INTRAMUSCULAR | Status: AC
  Administered 2019-02-15: 1 mg via INTRAVENOUS
  Filled 2019-02-15: qty 1

## 2019-02-15 MED ORDER — BUDESONIDE 0.25 MG/2ML IN SUSP
0.2500 mg | Freq: Four times a day (QID) | RESPIRATORY_TRACT | Status: DC
  Administered 2019-02-15 – 2019-02-20 (×20): 0.25 mg via RESPIRATORY_TRACT
  Filled 2019-02-15 (×21): qty 2

## 2019-02-15 MED ORDER — DIPHENHYDRAMINE HCL 50 MG/ML IJ SOLN
25.0000 mg | Freq: Once | INTRAMUSCULAR | Status: AC
  Administered 2019-02-15: 25 mg via INTRAVENOUS
  Filled 2019-02-15: qty 1

## 2019-02-15 MED ORDER — FENTANYL CITRATE (PF) 100 MCG/2ML IJ SOLN
100.0000 ug | INTRAMUSCULAR | Status: DC | PRN
  Administered 2019-02-15 (×2): 100 ug via INTRAVENOUS
  Filled 2019-02-15: qty 2

## 2019-02-15 MED ORDER — CHLORHEXIDINE GLUCONATE 0.12% ORAL RINSE (MEDLINE KIT)
15.0000 mL | Freq: Two times a day (BID) | OROMUCOSAL | Status: DC
  Administered 2019-02-15 – 2019-02-16 (×3): 15 mL via OROMUCOSAL

## 2019-02-15 MED ORDER — VANCOMYCIN HCL IN DEXTROSE 1-5 GM/200ML-% IV SOLN: 1000.0000 mg | Freq: Once | INTRAVENOUS | Status: DC

## 2019-02-15 MED ORDER — VANCOMYCIN HCL 10 G IV SOLR
1500.0000 mg | Freq: Once | INTRAVENOUS | Status: AC
  Administered 2019-02-15: 1500 mg via INTRAVENOUS
  Filled 2019-02-15: qty 1500

## 2019-02-15 MED ORDER — ONDANSETRON HCL 4 MG PO TABS: 4.0000 mg | ORAL_TABLET | Freq: Four times a day (QID) | ORAL | Status: DC | PRN

## 2019-02-15 MED ORDER — NOREPINEPHRINE BITARTRATE 1 MG/ML IV SOLN
0.0000 ug/min | INTRAVENOUS | Status: DC
  Filled 2019-02-15: qty 4

## 2019-02-15 MED ORDER — MIDAZOLAM HCL 2 MG/2ML IJ SOLN
2.0000 mg | Freq: Once | INTRAMUSCULAR | Status: AC
  Administered 2019-02-15: 2 mg via INTRAVENOUS

## 2019-02-15 MED ORDER — SODIUM CHLORIDE 0.9% FLUSH: 10.0000 mL | INTRAVENOUS | Status: DC | PRN

## 2019-02-15 MED ORDER — SUCCINYLCHOLINE CHLORIDE 20 MG/ML IJ SOLN
INTRAMUSCULAR | Status: AC | PRN
  Administered 2019-02-15: 100 mg via INTRAVENOUS

## 2019-02-15 MED ORDER — MORPHINE SULFATE (PF) 4 MG/ML IV SOLN
4.0000 mg | Freq: Once | INTRAVENOUS | Status: AC
  Administered 2019-02-15: 4 mg via INTRAVENOUS

## 2019-02-15 MED ORDER — LORAZEPAM 2 MG/ML IJ SOLN
INTRAMUSCULAR | Status: AC
  Filled 2019-02-15: qty 2

## 2019-02-15 MED ORDER — METHYLPREDNISOLONE SODIUM SUCC 40 MG IJ SOLR: 40.0000 mg | Freq: Four times a day (QID) | INTRAMUSCULAR | Status: DC

## 2019-02-15 MED ORDER — IPRATROPIUM-ALBUTEROL 0.5-2.5 (3) MG/3ML IN SOLN
3.0000 mL | Freq: Four times a day (QID) | RESPIRATORY_TRACT | Status: DC
  Administered 2019-02-15 – 2019-02-19 (×16): 3 mL via RESPIRATORY_TRACT
  Filled 2019-02-15 (×15): qty 3

## 2019-02-15 MED ORDER — VASOPRESSIN 20 UNIT/ML IV SOLN
0.0300 [IU]/min | INTRAVENOUS | Status: DC
  Administered 2019-02-15: 0.03 [IU]/min via INTRAVENOUS
  Filled 2019-02-15: qty 2

## 2019-02-15 MED ORDER — DEXMEDETOMIDINE HCL IN NACL 400 MCG/100ML IV SOLN
0.4000 ug/kg/h | INTRAVENOUS | Status: DC
  Administered 2019-02-15: 0.4 ug/kg/h via INTRAVENOUS
  Administered 2019-02-16: 0.6 ug/kg/h via INTRAVENOUS
  Filled 2019-02-15 (×2): qty 100

## 2019-02-15 MED ORDER — SODIUM CHLORIDE 0.9 % IV SOLN
0.0000 ug/min | INTRAVENOUS | Status: DC
  Administered 2019-02-15: 15 ug/min via INTRAVENOUS
  Administered 2019-02-15: 2 ug/min via INTRAVENOUS
  Filled 2019-02-15 (×2): qty 4

## 2019-02-15 MED ORDER — PROPOFOL 1000 MG/100ML IV EMUL: 0.0000 ug/kg/min | INTRAVENOUS | Status: DC

## 2019-02-15 MED ORDER — LORAZEPAM 2 MG/ML IJ SOLN
INTRAMUSCULAR | Status: AC
  Administered 2019-02-15: 1 mg via INTRAVENOUS
  Filled 2019-02-15: qty 1

## 2019-02-15 MED ORDER — ORAL CARE MOUTH RINSE
15.0000 mL | OROMUCOSAL | Status: DC
  Administered 2019-02-15 – 2019-02-16 (×10): 15 mL via OROMUCOSAL

## 2019-02-15 MED ORDER — ACETAMINOPHEN 650 MG RE SUPP: 650.0000 mg | Freq: Four times a day (QID) | RECTAL | Status: DC | PRN

## 2019-02-15 MED ORDER — POTASSIUM CHLORIDE 2 MEQ/ML IV SOLN
INTRAVENOUS | Status: DC
  Administered 2019-02-15: 14:00:00 via INTRAVENOUS
  Filled 2019-02-15 (×3): qty 1000

## 2019-02-15 MED ORDER — SODIUM CHLORIDE 0.9 % IV SOLN
2.0000 g | Freq: Two times a day (BID) | INTRAVENOUS | Status: DC
  Administered 2019-02-15: 2 g via INTRAVENOUS
  Filled 2019-02-15 (×2): qty 2

## 2019-02-15 MED ORDER — PROPOFOL 1000 MG/100ML IV EMUL
5.0000 ug/kg/min | INTRAVENOUS | Status: DC
  Administered 2019-02-15: 5 ug/kg/min via INTRAVENOUS
  Filled 2019-02-15: qty 100

## 2019-02-15 MED ORDER — FENTANYL CITRATE (PF) 100 MCG/2ML IJ SOLN: 100.0000 ug | INTRAMUSCULAR | Status: DC | PRN

## 2019-02-15 MED ORDER — ONDANSETRON HCL 4 MG/2ML IJ SOLN
4.0000 mg | Freq: Four times a day (QID) | INTRAMUSCULAR | Status: DC | PRN
  Administered 2019-02-21: 4 mg via INTRAVENOUS
  Filled 2019-02-15: qty 2

## 2019-02-15 MED ORDER — MORPHINE SULFATE (PF) 4 MG/ML IV SOLN
INTRAVENOUS | Status: AC
  Administered 2019-02-15: 4 mg via INTRAVENOUS
  Filled 2019-02-15: qty 1

## 2019-02-15 MED ORDER — SODIUM CHLORIDE 0.9% FLUSH
10.0000 mL | Freq: Two times a day (BID) | INTRAVENOUS | Status: DC
  Administered 2019-02-15: 20 mL
  Administered 2019-02-16: 10 mL
  Administered 2019-02-16: 20 mL
  Administered 2019-02-17: 10 mL
  Administered 2019-02-17: 40 mL
  Administered 2019-02-18 – 2019-02-24 (×9): 10 mL

## 2019-02-15 MED ORDER — ACETAMINOPHEN 325 MG PO TABS: 650.0000 mg | ORAL_TABLET | Freq: Four times a day (QID) | ORAL | Status: DC | PRN

## 2019-02-15 MED ORDER — SODIUM CHLORIDE 0.9 % IV BOLUS
1000.0000 mL | Freq: Once | INTRAVENOUS | Status: AC
  Administered 2019-02-15: 1000 mL via INTRAVENOUS

## 2019-02-15 MED ORDER — FENTANYL 2500MCG IN NS 250ML (10MCG/ML) PREMIX INFUSION
0.0000 ug/h | INTRAVENOUS | Status: DC
  Administered 2019-02-15: 250 ug/h via INTRAVENOUS
  Administered 2019-02-15: 50 ug/h via INTRAVENOUS
  Administered 2019-02-16: 200 ug/h via INTRAVENOUS
  Filled 2019-02-15 (×3): qty 250

## 2019-02-15 MED ORDER — ACETAMINOPHEN 650 MG RE SUPP
650.0000 mg | Freq: Four times a day (QID) | RECTAL | Status: DC | PRN
  Filled 2019-02-15: qty 1

## 2019-02-15 MED ORDER — ACETAMINOPHEN 325 MG PO TABS
650.0000 mg | ORAL_TABLET | Freq: Four times a day (QID) | ORAL | Status: DC | PRN
  Administered 2019-02-15: 650 mg
  Filled 2019-02-15: qty 2

## 2019-02-15 MED ORDER — ENOXAPARIN SODIUM 40 MG/0.4ML ~~LOC~~ SOLN
40.0000 mg | SUBCUTANEOUS | Status: DC
  Administered 2019-02-15 – 2019-02-17 (×3): 40 mg via SUBCUTANEOUS
  Filled 2019-02-15 (×3): qty 0.4

## 2019-02-15 MED ORDER — ETOMIDATE 2 MG/ML IV SOLN
INTRAVENOUS | Status: AC | PRN
  Administered 2019-02-15: 20 mg via INTRAVENOUS

## 2019-02-15 MED ORDER — SODIUM CHLORIDE 0.9 % IV SOLN
500.0000 mg | INTRAVENOUS | Status: DC
  Administered 2019-02-15: 500 mg via INTRAVENOUS
  Filled 2019-02-15 (×2): qty 500

## 2019-02-15 MED ORDER — POTASSIUM CHLORIDE IN NACL 20-0.9 MEQ/L-% IV SOLN
INTRAVENOUS | Status: DC
  Administered 2019-02-15: 11:00:00 via INTRAVENOUS
  Filled 2019-02-15 (×2): qty 1000

## 2019-02-15 NOTE — ED Provider Notes (Signed)
Procedure Name: Intubation Date/Time: 02/15/2019 8:33 AM Performed by: Emily Filbert, MD Pre-anesthesia Checklist: Patient identified, Patient being monitored, Emergency Drugs available, Timeout performed and Suction available Oxygen Delivery Method: Non-rebreather mask Preoxygenation: Pre-oxygenation with 100% oxygen Induction Type: Rapid sequence Ventilation: Mask ventilation without difficulty Laryngoscope Size: Glidescope and 4 Grade View: Grade II Tube size: 7.5 mm Number of attempts: 1 Placement Confirmation: ETT inserted through vocal cords under direct vision,  CO2 detector and Breath sounds checked- equal and bilateral Tube secured with: ETT holder Dental Injury: Teeth and Oropharynx as per pre-operative assessment  Difficulty Due To: Difficulty was unanticipated      Patient was intubated due to increased agitation and encephalopathy.  She was intubated without difficulty using 20 mg of etomidate and 100 mg succinylcholine and she was placed on propofol for sedation.  Hospitalist service has been updated.    Emily Filbert, MD 02/15/19 [phone]

## 2019-02-15 NOTE — ED Triage Notes (Signed)
Pt arrived via EMS from home where husband woke this AM and found pt without her O2 on and pt was combative and altered from her baseline. Pt has hx/o COPD. Pt is responsive to painful stimuli. Non-re breather in place. MD at bedside.

## 2019-02-15 NOTE — ED Provider Notes (Signed)
[facility] Emergency Department Provider Note  ____________________________________________  Time seen: Approximately 7:11 AM  I have reviewed the triage vital signs and the nursing notes.   HISTORY  Chief Complaint Altered Mental Status    Level 5 Caveat: Portions of the History and Physical including HPI and review of systems are unable to be completely obtained due to patient being a poor historian   HPI Kylie Monroe is a 63 y.o. female with a history of COPD, hepatitis C, hypertension  and pneumonia who comes the ED by EMS this morning due to respiratory distress.  Husband woke up and found the patient have her oxygen off and combative and confused.  EMS arrived on scene, noted initial oxygen saturation of about 75% on room air, mid 80s on her nasal cannula.  They placed a nonrebreather for transport.     Past Medical History:  Diagnosis Date  . COPD (chronic obstructive pulmonary disease) (HCC)   . Hepatitis C   . Hypertension      Patient Active Problem List   Diagnosis Date Noted  . Substance abuse (HCC)   . Pressure injury of skin 11/02/2018  . DNR (do not resuscitate) discussion   . Palliative care by specialist   . Malnutrition of moderate degree 10/26/2018  . Pneumonia   . Septic shock (HCC)   . Acute respiratory failure with hypoxia (HCC) 10/24/2018  . Leukopenia 01/24/2018  . Thrombocytopenia (HCC) 01/24/2018  . Other pancytopenia (HCC) 12/25/2017  . Iron deficiency anemia 12/25/2017     Past Surgical History:  Procedure Laterality Date  . CHOLECYSTECTOMY    . prolapsed rectum       Prior to Admission medications   Medication Sig Start Date End Date Taking? Authorizing Provider  albuterol (PROAIR HFA) 108 (90 Base) MCG/ACT inhaler Inhale 90 mcg into the lungs as needed. 01/25/13   [provider]  aspirin 81 MG chewable tablet Chew 81 mg by mouth daily. 09/19/16   [provider]  bisacodyl (DULCOLAX)  5 MG EC tablet Take 5 mg by mouth daily. 09/25/08   [provider]  Buprenorphine HCl-Naloxone HCl (SUBOXONE) 8-2 MG FILM Take 8.5 Film by mouth 3 (three) times daily. 09/25/16   [provider]  FLOVENT HFA 220 MCG/ACT inhaler Inhale 220 mcg into the lungs 2 (two) times daily. 12/10/17   [provider]  Fluticasone-Salmeterol (ADVAIR) 250-50 MCG/DOSE AEPB Inhale 1 puff into the lungs 2 (two) times daily.    [provider]  gabapentin (NEURONTIN) 300 MG capsule Take 300 mg by mouth daily. 09/19/16   [provider]  LORazepam (ATIVAN) 0.5 MG tablet Take 1 tablet (0.5 mg total) by mouth 2 (two) times daily as needed for anxiety or sleep. 11/07/18   Milagros Loll, MD     Allergies Patient has no known allergies.   Family History  Problem Relation Age of Onset  . Cancer Mother     Social History Social History   Tobacco Use  . Smoking status: Current Every Day Smoker    Packs/day: 1.00    Years: 44.00    Pack years: 44.00    Types: Cigarettes  . Smokeless tobacco: Never Used  Substance Use Topics  . Alcohol use: No  . Drug use: Yes    Types: Cocaine    Comment: valium    Review of Systems Level 5 Caveat: Portions of the History and Physical including HPI and review of systems are unable to be completely  obtained due to patient being a poor historian   ____________________________________________   PHYSICAL EXAM:  VITAL SIGNS: ED Triage Vitals  Enc Vitals Group     BP 02/15/19 0639 (!) 85/48     Pulse Rate 02/15/19 0639 (!) 103     Resp 02/15/19 0639 (!) 23     Temp --      Temp src --      SpO2 02/15/19 0639 100 %     Weight 02/15/19 0653 134 lb 0.6 oz (60.8 kg)     Height --      Head Circumference --      Peak Flow --      Pain Score --      Pain Loc --      Pain Edu? --      Excl. in GC? --     Vital signs reviewed, nursing assessments reviewed.   Constitutional:   Awake, not alert, not oriented.   Ill-appearing, respiratory distress Eyes:   Conjunctivae are normal. EOMI. PERRL. ENT      Head:   Normocephalic and atraumatic.      Nose:   No congestion/rhinnorhea.       Mouth/Throat:   Dry mucous membranes, no pharyngeal erythema. No peritonsillar mass.       Neck:   No meningismus. Full ROM. Hematological/Lymphatic/Immunilogical:   No cervical lymphadenopathy. Cardiovascular:   Tachycardia heart rate 105. Symmetric bilateral radial and DP pulses.  No murmurs. Cap refill less than 2 seconds. Respiratory:   Increased work of breathing, tachypnea.  Diminished breath sounds on the left with rhonchi.  No significant wheezing. Gastrointestinal:   Soft and nontender. Non distended. There is no CVA tenderness.  No rebound, rigidity, or guarding.  Musculoskeletal:   Normal range of motion in all extremities. No joint effusions.  No lower extremity tenderness.  No edema. Neurologic:   Combative, uncooperative.  Motor grossly intact.  Moving all extremities No acute focal neurologic deficits are appreciated.  Skin:    Skin is warm, dry and intact. No rash noted.  No petechiae, purpura, or bullae.  ____________________________________________    LABS (pertinent positives/negatives) (all labs ordered are listed, but only abnormal results are displayed) Labs Reviewed  BASIC METABOLIC PANEL - Abnormal; Notable for the following components:      Result Value   Sodium 132 (*)    Potassium 3.4 (*)    Chloride 96 (*)    Glucose, Bld 115 (*)    BUN 27 (*)    Calcium 8.5 (*)    All other components within normal limits  CBC - Abnormal; Notable for the following components:   Hemoglobin 11.6 (*)    RDW 15.9 (*)    Platelets 81 (*)    All other components within normal limits  CULTURE, BLOOD (ROUTINE X 2)  CULTURE, BLOOD (ROUTINE X 2)  URINE CULTURE  TROPONIN I  BLOOD GAS, VENOUS  LACTIC ACID, PLASMA  LACTIC ACID, PLASMA  PROCALCITONIN  URINALYSIS, COMPLETE (UACMP) WITH MICROSCOPIC   INFLUENZA PANEL BY PCR (TYPE A & B)   ____________________________________________   EKG  Interpreted by me Sinus tachycardia rate 108, normal axis intervals QRS ST segments and T waves.  ____________________________________________    RADIOLOGY  Dg Chest Portable 1 View  Result Date: 02/15/2019 CLINICAL DATA:  Shortness of breath EXAM: PORTABLE CHEST 1 VIEW COMPARISON:  11/04/2018 FINDINGS: Cardiac shadow is within normal limits. Previously seen right-sided infiltrate has significantly improved in the interval from the  prior study. New left-sided patchy infiltrate is noted diffusely throughout the lung. No sizable effusion is noted. No acute bony abnormality is seen. Old left clavicular fracture is noted. IMPRESSION: Resolution of previously seen diffuse right-sided infiltrate. New diffuse left-sided infiltrate is noted. Electronically Signed   By: Alcide Clever M.D.   On: 02/15/2019 06:57    ____________________________________________   PROCEDURES .Critical Care Performed by: Sharman Cheek, MD Authorized by: Sharman Cheek, MD   Critical care provider statement:    Critical care time (minutes):  35   Critical care time was exclusive of:  Separately billable procedures and treating other patients   Critical care was necessary to treat or prevent imminent or life-threatening deterioration of the following conditions:  Sepsis, shock and respiratory failure   Critical care was time spent personally by me on the following activities:  Development of treatment plan with patient or surrogate, discussions with consultants, evaluation of patient's response to treatment, examination of patient, obtaining history from patient or surrogate, ordering and performing treatments and interventions, ordering and review of laboratory studies, ordering and review of radiographic studies, pulse oximetry, re-evaluation of patient's condition and review of old  charts    ____________________________________________  DIFFERENTIAL DIAGNOSIS   Pneumonia, sepsis, pleural effusion, COPD exacerbation with hypercapnia and CO2 narcosis  CLINICAL IMPRESSION / ASSESSMENT AND PLAN / ED COURSE  Pertinent labs & imaging results that were available during my care of the patient were reviewed by me and considered in my medical decision making (see chart for details).    Patient presents with altered mental status, mild respiratory distress, hypotension tachypnea tachycardia and hypoxia.  Sepsis work-up initiated.  Chest x-ray shows diffuse infiltrate of the left lung consistent with pneumonia.  Just a few months ago she was treated for septic shock with pneumonia of the right lung.  Starting broad-spectrum antibiotics, Ativan 1 mg IV for treatment of agitation.  Plan to admit for further management.  Not requiring intubation at this time.  Large volume fluid resuscitation for support of her hemodynamics.      ____________________________________________   FINAL CLINICAL IMPRESSION(S) / ED DIAGNOSES    Final diagnoses:  Septic shock (HCC)  Healthcare-associated pneumonia  Metabolic encephalopathy     ED Discharge Orders    None      Portions of this note were generated with dragon dictation software. Dictation errors may occur despite best attempts at proofreading.   Sharman Cheek, MD 02/15/19 [phone]

## 2019-02-15 NOTE — H&P (Signed)
[facility]    PATIENT NAME: Kylie Monroe    MR#:  [id number]  DATE OF BIRTH:  08-25-1956  DATE OF ADMISSION:  02/15/2019  PRIMARY CARE PHYSICIAN: Donaciano Eva, FNP   REQUESTING/REFERRING PHYSICIAN: Dr. Sharman Cheek  CHIEF COMPLAINT:   Chief Complaint  Patient presents with  . Altered Mental Status    HISTORY OF PRESENT ILLNESS:  Kylie Monroe  is a 63 y.o. female with a known history of COPD, hepatitis C, history of heroin abuse on Suboxone program, hypertension who presented to the hospital due to altered mental status and hypoxia.  Patient is presently intubated and sedated therefore most of the history obtained from the chart and from the ER physician.  Patient's boyfriend apparently found her unresponsive this morning and therefore called EMS.  EMS on arrival noted that the patient was significantly hypoxic with O2 sats in the high 70s to low 80s.  Patient was urgently brought to the ER noted to be in acute respiratory failure with hypoxia.  Patient's chest x-ray findings are suggestive of left-sided pneumonia and patient was started on broad-spectrum antibiotics and given IV fluids.  While in the ER patient became quite agitated and would not follow any commands and was very restless and therefore was intubated for airway protection and agitation.  Hospitalist services were contacted for admission.  PAST MEDICAL HISTORY:   Past Medical History:  Diagnosis Date  . COPD (chronic obstructive pulmonary disease) (HCC)   . Hepatitis C   . Hypertension     PAST SURGICAL HISTORY:   Past Surgical History:  Procedure Laterality Date  . CHOLECYSTECTOMY    . prolapsed rectum      SOCIAL HISTORY:   Social History   Tobacco Use  . Smoking status: Current Every Day Smoker    Packs/day: 1.00    Years: 44.00    Pack years: 44.00    Types: Cigarettes  . Smokeless tobacco: Never Used  Substance Use Topics  . Alcohol use: No     FAMILY HISTORY:   Family History  Problem Relation Age of Onset  . Cancer Mother     DRUG ALLERGIES:  No Known Allergies  REVIEW OF SYSTEMS:   Review of Systems  Unable to perform ROS: Intubated    MEDICATIONS AT HOME:   Prior to Admission medications   Medication Sig Start Date End Date Taking? Authorizing Provider  albuterol (PROAIR HFA) 108 (90 Base) MCG/ACT inhaler Inhale 1-2 puffs into the lungs every 4 (four) hours as needed for wheezing or shortness of breath.    Yes [provider]  aspirin 81 MG chewable tablet Chew 81 mg by mouth daily. 09/19/16  Yes [provider]  Buprenorphine HCl-Naloxone HCl (SUBOXONE) 8-2 MG FILM Take 1 Film by mouth 3 (three) times daily.    Yes [provider]  cetirizine (ZYRTEC) 10 MG tablet Take 10 mg by mouth daily as needed for itching. 12/13/18  Yes [provider]  fluticasone (FLONASE) 50 MCG/ACT nasal spray Place 2 sprays into both nostrils daily.   Yes [provider]  gabapentin (NEURONTIN) 300 MG capsule Take 300 mg by mouth 2 (two) times daily.    Yes [provider]  ipratropium-albuterol (DUONEB) 0.5-2.5 (3) MG/3ML SOLN Inhale 3 mLs into the lungs 4 (four) times daily. 11/25/18  Yes [provider]  LORazepam (ATIVAN) 0.5 MG tablet Take 1 tablet (0.5 mg total) by mouth 2 (two) times daily as needed for  anxiety or sleep. 11/07/18  Yes Sudini, Wardell Heath, MD  topiramate (TOPAMAX) 25 MG tablet Take 25 mg by mouth daily. 01/26/19  Yes [provider]      VITAL SIGNS:  Blood pressure (!) 165/84, pulse (!) 136, resp. rate 19, weight 60.8 kg, SpO2 94 %.  PHYSICAL EXAMINATION:  Physical Exam  GENERAL:  63 y.o.-year-old patient lying in the bed intubated and sedated.  EYES: Pupils equal, round, reactive to light. No scleral icterus.  HEENT: Head atraumatic, normocephalic.  ET tube in place.   NECK:  Supple, no jugular venous distention. No thyroid enlargement, no  tenderness.  LUNGS: Coarse breath sounds bilaterally, diffuse rhonchi bilaterally.  No wheezing, no rales. CARDIOVASCULAR: S1, S2 Tachycardic. No murmurs, rubs, gallops, clicks.  ABDOMEN: Soft, nontender, nondistended. Bowel sounds present. No organomegaly or mass.  EXTREMITIES: No pedal edema, cyanosis, or clubbing. + 2 pedal & radial pulses b/l.   NEUROLOGIC: Sedated and intubated  pSYCHIATRIC: Sedated and intubated. SKIN: No obvious rash, lesion, or ulcer.   LABORATORY PANEL:   CBC Recent Labs  Lab 02/15/19 0631  WBC 7.9  HGB 11.6*  HCT 36.1  PLT 81*   ------------------------------------------------------------------------------------------------------------------  Chemistries  Recent Labs  Lab 02/15/19 0631  NA 132*  K 3.4*  CL 96*  CO2 28  GLUCOSE 115*  BUN 27*  CREATININE 0.70  CALCIUM 8.5*   ------------------------------------------------------------------------------------------------------------------  Cardiac Enzymes Recent Labs  Lab 02/15/19 0631  TROPONINI <0.03   ------------------------------------------------------------------------------------------------------------------  RADIOLOGY:  Dg Chest Portable 1 View  Result Date: 02/15/2019 CLINICAL DATA:  Shortness of breath EXAM: PORTABLE CHEST 1 VIEW COMPARISON:  11/04/2018 FINDINGS: Cardiac shadow is within normal limits. Previously seen right-sided infiltrate has significantly improved in the interval from the prior study. New left-sided patchy infiltrate is noted diffusely throughout the lung. No sizable effusion is noted. No acute bony abnormality is seen. Old left clavicular fracture is noted. IMPRESSION: Resolution of previously seen diffuse right-sided infiltrate. New diffuse left-sided infiltrate is noted. Electronically Signed   By: Alcide Clever M.D.   On: 02/15/2019 06:57     IMPRESSION AND PLAN:   63 year old female with past medical history of substance abuse, hypertension, hepatitis C,  COPD with ongoing tobacco abuse who presented to the hospital due to altered mental status and hypoxia.  1.  Altered mental status- suspected to be metabolic encephalopathy secondary to underlying sepsis. -Currently patient is intubated and sedated, and follow mental status once her respiratory status improves.  We will also check urine drug screen given history of substance abuse.  2.  Sepsis-patient meets criteria given her elevated lactic acid, tachycardia and chest x-ray findings suggestive of left-sided pneumonia. - We will treat patient with broad-spectrum IV antibiotics with vancomycin, cefepime.  Follow cultures.  Continue IV fluids and vasopressors if needed.  3.  Acute respiratory failure with hypoxia- secondary to pneumonia. -Continue broad-spectrum IV antibiotics with vancomycin, cefepime.  We will add some bronchodilators and also IV steroids. -Patient is currently intubated and sedated and continue vent support as per intensivist/pulmonary.  4.  COPD-acute exacerbation secondary to pneumonia. -We will treat the patient with IV steroids, scheduled duo nebs.  We will also continue empiric antibiotics as stated above.  5.  Thrombocytopenia-secondary to patient's history of hepatitis C.  We will continue to follow platelet count.  No acute bleeding presently.    All the records are reviewed and case discussed with ED provider. Management plans discussed with the patient, family and they are in agreement.  CODE STATUS: Full code  TOTAL Critical Care TIME TAKING CARE OF THIS PATIENT: 45 minutes.    Houston Siren M.D on 02/15/2019 at 8:54 AM  Between 7am to 6pm - Pager - [phone]  After 6pm go to [email or web address] - password [email or web address] [facility]  [facility]  Office  [phone]  CC: Primary care physician; Donaciano Eva, FNP

## 2019-02-15 NOTE — Consult Note (Addendum)
Name: Kylie Monroe MRN: [id number] DOB: 03/05/1956     CONSULTATION DATE: 02/15/2019  REFERRING MD : sudini  CHIEF COMPLAINT: resp failure     HISTORY OF PRESENT ILLNESS:   63 y.o. female with a known history of COPD, hepatitis C, history of heroin abuse Seen in ER for severe hypoxia and mental status changes Patient was intubated and sedated Found unresponsive by boyfriend Patient was given IV abx  Patient is critically ill Prognosis is guarded   PAST MEDICAL HISTORY :   has a past medical history of COPD (chronic obstructive pulmonary disease) (HCC), Hepatitis C, and Hypertension.  has a past surgical history that includes Cholecystectomy and prolapsed rectum. Prior to Admission medications   Medication Sig Start Date End Date Taking? Authorizing Provider  albuterol (PROAIR HFA) 108 (90 Base) MCG/ACT inhaler Inhale 1-2 puffs into the lungs every 4 (four) hours as needed for wheezing or shortness of breath.    Yes [provider]  aspirin 81 MG chewable tablet Chew 81 mg by mouth daily. 09/19/16  Yes [provider]  Buprenorphine HCl-Naloxone HCl (SUBOXONE) 8-2 MG FILM Take 1 Film by mouth 3 (three) times daily.    Yes [provider]  cetirizine (ZYRTEC) 10 MG tablet Take 10 mg by mouth daily as needed for itching. 12/13/18  Yes [provider]  fluticasone (FLONASE) 50 MCG/ACT nasal spray Place 2 sprays into both nostrils daily.   Yes [provider]  fluticasone (FLOVENT HFA) 220 MCG/ACT inhaler Inhale 2 puffs into the lungs 2 (two) times daily.   Yes [provider]  gabapentin (NEURONTIN) 300 MG capsule Take 300 mg by mouth 2 (two) times daily.    Yes [provider]  ipratropium-albuterol (DUONEB) 0.5-2.5 (3) MG/3ML SOLN Inhale 3 mLs into the lungs 4 (four) times daily. 11/25/18  Yes [provider]  LORazepam (ATIVAN) 0.5 MG tablet Take 1 tablet (0.5 mg total) by mouth 2 (two) times daily as  needed for anxiety or sleep. 11/07/18  Yes Sudini, Wardell Heath, MD  topiramate (TOPAMAX) 25 MG tablet Take 25 mg by mouth daily. 01/26/19  Yes [provider]   No Known Allergies  FAMILY HISTORY:  family history includes Cancer in her mother. SOCIAL HISTORY:  reports that she has been smoking cigarettes. She has a 44.00 pack-year smoking history. She has never used smokeless tobacco. She reports current drug use. Drug: Cocaine. She reports that she does not drink alcohol.  REVIEW OF SYSTEMS:   Unable to obtain due to critical illness   VITAL SIGNS: Temp:  [99.4 F (37.4 C)-100.7 F (38.2 C)] 100.7 F (38.2 C) (03/10 1200) Pulse Rate:  [96-136] 96 (03/10 1200) Resp:  [8-34] 26 (03/10 1200) BP: (73-173)/(45-93) 73/49 (03/10 1200) SpO2:  [91 %-100 %] 96 % (03/10 1203) FiO2 (%):  [40 %-50 %] 40 % (03/10 1203) Weight:  [60.4 kg-60.8 kg] 60.4 kg (03/10 1013)  Physical Examination:  GENERAL:critically ill appearing, +resp distress HEAD: Normocephalic, atraumatic.  EYES: Pupils equal, round, reactive to light.  No scleral icterus.  MOUTH: Moist mucosal membrane. NECK: Supple. No JVD.  PULMONARY: +rhonchi, +wheezing CARDIOVASCULAR: S1 and S2. Regular rate and rhythm. No murmurs, rubs, or gallops.  GASTROINTESTINAL: Soft, nontender, -distended. No masses. Positive bowel sounds. No hepatosplenomegaly.  MUSCULOSKELETAL: No swelling, clubbing, or edema.  NEUROLOGIC: obtunded SKIN:intact,warm,dry  I personally reviewed lab work that was obtained in last 24 hrs. CXR Independently reviewed- B/l infiltrates pneumonia    CULTURE RESULTS  Recent Results (from the past 240 hour(s))  MRSA PCR Screening     Status: None   Collection Time: 02/15/19 10:24 AM  Result Value Ref Range Status   MRSA by PCR NEGATIVE NEGATIVE Final    Comment:        The GeneXpert MRSA Assay (FDA approved for NASAL specimens only), is one component of a comprehensive MRSA colonization surveillance  program. It is not intended to diagnose MRSA infection nor to guide or monitor treatment for MRSA infections. Performed at [facility], [address] [place], [place] [address]         CBC    Component Value Date/Time   WBC 7.9 02/15/2019 0631   RBC 3.94 02/15/2019 0631   HGB 11.6 (L) 02/15/2019 0631   HGB 13.7 03/13/2014 1603   HCT 36.1 02/15/2019 0631   HCT 41.7 03/13/2014 1603   PLT 81 (L) 02/15/2019 0631   PLT 100 (L) 03/13/2014 1603   MCV 91.6 02/15/2019 0631   MCV 102 (H) 03/13/2014 1603   MCH 29.4 02/15/2019 0631   MCHC 32.1 02/15/2019 0631   RDW 15.9 (H) 02/15/2019 0631   RDW 15.1 (H) 03/13/2014 1603   LYMPHSABS 0.6 (L) 11/05/2018 0605   MONOABS 0.4 11/05/2018 0605   EOSABS 0.0 11/05/2018 0605   BASOSABS 0.0 11/05/2018 0605   CMP     Component Value Date/Time   NA 132 (L) 02/15/2019 0631   NA 136 03/13/2014 1603   K 3.4 (L) 02/15/2019 0631   K 3.7 03/13/2014 1603   CL 96 (L) 02/15/2019 0631   CL 102 03/13/2014 1603   CO2 28 02/15/2019 0631   CO2 31 03/13/2014 1603   GLUCOSE 115 (H) 02/15/2019 0631   GLUCOSE 110 (H) 03/13/2014 1603   BUN 27 (H) 02/15/2019 0631   BUN 7 03/13/2014 1603   CREATININE 0.70 02/15/2019 0631   CREATININE 0.73 03/13/2014 1603   CALCIUM 8.5 (L) 02/15/2019 0631   CALCIUM 8.3 (L) 03/13/2014 1603   PROT 7.4 11/03/2018 0446   PROT 5.8 (L) 03/14/2014 1141   ALBUMIN 2.7 (L) 11/03/2018 0446   ALBUMIN 2.3 (L) 03/14/2014 1141   AST 29 11/03/2018 0446   AST 119 (H) 03/14/2014 1141   ALT 17 11/03/2018 0446   ALT 84 (H) 03/14/2014 1141   ALKPHOS 66 11/03/2018 0446   ALKPHOS 106 03/14/2014 1141   BILITOT 1.1 11/03/2018 0446   BILITOT 0.6 03/14/2014 1141   GFRNONAA >60 02/15/2019 0631   GFRNONAA >60 03/13/2014 1603   GFRAA >60 02/15/2019 0631   GFRAA >60 03/13/2014 1603      IMAGING    Dg Chest 1 View  Result Date: 02/15/2019 CLINICAL DATA:  ETT placement. EXAM: CHEST  1 VIEW COMPARISON:  February 15, 2019  FINDINGS: The ETT terminates at the right mainstem bronchus. Recommend withdrawing 3 cm. The NG tube terminates below today's film. No pneumothorax. Diffuse infiltrate in the left lung. Increasing mild infiltrate on the right. No change in the cardiomediastinal silhouette. IMPRESSION: 1. The ETT terminates at the right mainstem bronchus. Recommend withdrawing 3 cm. The NG tube terminates below today's film. 2. Stable diffuse infiltrate in the left lung. 3. Mild patchy opacity in the right lung, increased since the film from earlier today. Findings called to Dr. Mayford KnifeWilliams Electronically Signed   By: Gerome Samavid  Williams III M.D   On: 02/15/2019 09:36   Dg Chest Portable 1 View  Result Date: 02/15/2019 CLINICAL DATA:  Shortness of breath EXAM: PORTABLE  CHEST 1 VIEW COMPARISON:  11/04/2018 FINDINGS: Cardiac shadow is within normal limits. Previously seen right-sided infiltrate has significantly improved in the interval from the prior study. New left-sided patchy infiltrate is noted diffusely throughout the lung. No sizable effusion is noted. No acute bony abnormality is seen. Old left clavicular fracture is noted. IMPRESSION: Resolution of previously seen diffuse right-sided infiltrate. New diffuse left-sided infiltrate is noted. Electronically Signed   By: Alcide Clever M.D.   On: 02/15/2019 06:57        Indwelling Urinary Catheter continued, requirement due to   Reason to continue Indwelling Urinary Catheter for strict Intake/Output monitoring for hemodynamic instability         Ventilator continued, requirement due to, resp failure    Ventilator Sedation RASS 0 to -2     ASSESSMENT AND PLAN SYNOPSIS   Severe Hypoxic and Hypercapnic Respiratory Failure from aspiration pneumonia -continue Full MV support -continue Bronchodilator Therapy -Wean Fio2 and PEEP as tolerated Unable to wean from vent  SEVERE COPD EXACERBATION -continue IV steroids as prescribed -continue NEB THERAPY as  prescribed -morphine as needed -wean fio2 as needed and tolerated   NEUROLOGY - intubated and sedated - minimal sedation to achieve a RASS goal: -1  Septic shock -use vasopressors to keep MAP>65 -follow ABG and LA -follow up cultures -emperic ABX -aggressive IV fluid resuscitation  CARDIAC ICU monitoring  ID -continue IV abx as prescibed -follow up cultures Check REsp viral panel Check sputum cultures Check legionella and strep AG  GI/Nutrition GI PROPHYLAXIS as indicated DIET-->start TF's Constipation protocol as indicated  ENDO - ICU hypoglycemic\Hyperglycemia protocol -check FSBS per protocol   ELECTROLYTES -follow labs as needed -replace as needed -pharmacy consultation and following   DVT/GI PRX ordered TRANSFUSIONS AS NEEDED MONITOR FSBS ASSESS the need for LABS as needed   Critical Care Time devoted to patient care services described in this note is 45 minutes.   Overall, patient is critically ill, prognosis is guarded.  Patient with Multiorgan failure and at high risk for cardiac arrest and death.    Lucie Leather, M.D.  [facility]  Medical Director [facility] Medical Director [facility] Cardio-Pulmonary Department

## 2019-02-15 NOTE — Procedures (Signed)
Central Venous Catheter Insertion Procedure Note Kylie Monroe [id number] 1956-03-15  Procedure: Insertion of Central Venous Catheter Indications: Assessment of intravascular volume, Drug and/or fluid administration and Frequent blood sampling  Procedure Details Consent: Unable to obtain consent because of emergent medical necessity. Time Out: Verified patient identification, verified procedure, site/side was marked, verified correct patient position, special equipment/implants available, medications/allergies/relevent history reviewed, required imaging and test results available.  Performed  Maximum sterile technique was used including antiseptics, cap, gloves, gown, hand hygiene, mask and sheet. Skin prep: Chlorhexidine; local anesthetic administered A antimicrobial bonded/coated triple lumen catheter was placed in the left femoral vein due to emergent situation using the Seldinger technique.  Evaluation Blood flow good Complications: No apparent complications Patient did tolerate procedure well. Chest X-ray ordered to verify placement.  CXR: Not applicable, pt in femoral vein.  Procedure was performed using Ultrasound for real time visualization of cannulization of left femoral vein.   Harlon Ditty, AGACNP-BC [facility] Pager: [phone] Cell: [phone]   Kylie Monroe 02/15/2019, 9:05 PM

## 2019-02-15 NOTE — Progress Notes (Signed)
CODE SEPSIS - PHARMACY COMMUNICATION  **Broad Spectrum Antibiotics should be administered within 1 hour of Sepsis diagnosis**  Time Code Sepsis Called/Page Received: 8421  Antibiotics Ordered: vanc/cefepime  Time of 1st antibiotic administration: 0730  Additional action taken by pharmacy:   If necessary, Name of Provider/Nurse Contacted:     Thomasene Ripple ,PharmD Clinical Pharmacist  02/15/2019  7:33 AM

## 2019-02-16 ENCOUNTER — Inpatient Hospital Stay: Payer: Medicaid Other

## 2019-02-16 DIAGNOSIS — J13 Pneumonia due to Streptococcus pneumoniae: Secondary | ICD-10-CM

## 2019-02-16 DIAGNOSIS — R6521 Severe sepsis with septic shock: Secondary | ICD-10-CM

## 2019-02-16 LAB — BASIC METABOLIC PANEL
Anion gap: 6 (ref 5–15)
BUN: 18 mg/dL (ref 6–20)
CO2: 27 mmol/L (ref 22–32)
Calcium: 8.1 mg/dL — ABNORMAL LOW (ref 8.9–10.3)
Chloride: 109 mmol/L (ref 98–111)
Creatinine, Ser: 0.58 mg/dL (ref 0.44–1.00)
GFR calc Af Amer: >60 mL/min (ref >60)
GLUCOSE: 134 mg/dL — AB (ref 70–99)
Potassium: 3 mmol/L — ABNORMAL LOW (ref 3.5–5.1)
Sodium: 142 mmol/L (ref 135–145)

## 2019-02-16 LAB — CBC
HCT: 35.2 % — ABNORMAL LOW (ref 36.0–46.0)
HEMOGLOBIN: 11 g/dL — AB (ref 12.0–15.0)
MCH: 29.3 pg (ref 26.0–34.0)
MCHC: 31.3 g/dL (ref 30.0–36.0)
MCV: 93.6 fL (ref 80.0–100.0)
Platelets: 72 10*3/uL — ABNORMAL LOW (ref 150–400)
RBC: 3.76 MIL/uL — ABNORMAL LOW (ref 3.87–5.11)
RDW: 17 % — ABNORMAL HIGH (ref 11.5–15.5)
WBC: 7.7 10*3/uL (ref 4.0–10.5)
nRBC: 0 % (ref 0.0–0.2)

## 2019-02-16 LAB — GLUCOSE, CAPILLARY
GLUCOSE-CAPILLARY: 69 mg/dL — AB (ref 70–99)
Glucose-Capillary: 118 mg/dL — ABNORMAL HIGH (ref 70–99)
Glucose-Capillary: 126 mg/dL — ABNORMAL HIGH (ref 70–99)
Glucose-Capillary: 81 mg/dL (ref 70–99)

## 2019-02-16 LAB — URINE CULTURE: Culture: NO GROWTH

## 2019-02-16 LAB — COMPREHENSIVE METABOLIC PANEL
ALK PHOS: 53 U/L (ref 38–126)
ALT: 33 U/L (ref 0–44)
AST: 71 U/L — AB (ref 15–41)
Albumin: 2.5 g/dL — ABNORMAL LOW (ref 3.5–5.0)
Anion gap: 8 (ref 5–15)
BUN: 18 mg/dL (ref 6–20)
CALCIUM: 7.6 mg/dL — AB (ref 8.9–10.3)
CO2: 23 mmol/L (ref 22–32)
Chloride: 104 mmol/L (ref 98–111)
Creatinine, Ser: 0.57 mg/dL (ref 0.44–1.00)
GFR calc Af Amer: >60 mL/min (ref >60)
GFR calc non Af Amer: >60 mL/min (ref >60)
Glucose, Bld: 135 mg/dL — ABNORMAL HIGH (ref 70–99)
Potassium: 5.8 mmol/L — ABNORMAL HIGH (ref 3.5–5.1)
Sodium: 135 mmol/L (ref 135–145)
Total Bilirubin: 2.9 mg/dL — ABNORMAL HIGH (ref 0.3–1.2)
Total Protein: 6.2 g/dL — ABNORMAL LOW (ref 6.5–8.1)

## 2019-02-16 LAB — LEGIONELLA PNEUMOPHILA SEROGP 1 UR AG: L. pneumophila Serogp 1 Ur Ag: NEGATIVE

## 2019-02-16 MED ORDER — SODIUM CHLORIDE 0.9 % IV SOLN
2.0000 g | INTRAVENOUS | Status: AC
  Administered 2019-02-17 – 2019-02-21 (×5): 2 g via INTRAVENOUS
  Filled 2019-02-16: qty 2
  Filled 2019-02-16: qty 20
  Filled 2019-02-16: qty 2
  Filled 2019-02-16: qty 20
  Filled 2019-02-16 (×3): qty 2

## 2019-02-16 MED ORDER — DEXMEDETOMIDINE HCL IN NACL 400 MCG/100ML IV SOLN
0.4000 ug/kg/h | INTRAVENOUS | Status: DC
  Administered 2019-02-16 – 2019-02-17 (×2): 0.6 ug/kg/h via INTRAVENOUS
  Filled 2019-02-16 (×2): qty 100

## 2019-02-16 MED ORDER — CHLORHEXIDINE GLUCONATE CLOTH 2 % EX PADS
6.0000 | MEDICATED_PAD | Freq: Every day | CUTANEOUS | Status: DC
  Administered 2019-02-16 – 2019-02-22 (×6): 6 via TOPICAL

## 2019-02-16 MED ORDER — LACTATED RINGERS IV SOLN
INTRAVENOUS | Status: DC
  Administered 2019-02-16: 07:00:00 via INTRAVENOUS

## 2019-02-16 MED ORDER — ACETAMINOPHEN 650 MG RE SUPP: 650.0000 mg | Freq: Four times a day (QID) | RECTAL | Status: DC | PRN

## 2019-02-16 MED ORDER — FAMOTIDINE 20 MG PO TABS
20.0000 mg | ORAL_TABLET | Freq: Every day | ORAL | Status: DC
  Administered 2019-02-16: 20 mg
  Filled 2019-02-16: qty 1

## 2019-02-16 MED ORDER — SODIUM CHLORIDE 0.9 % IV SOLN
INTRAVENOUS | Status: DC
  Administered 2019-02-16 – 2019-02-17 (×2): via INTRAVENOUS

## 2019-02-16 MED ORDER — BUPRENORPHINE HCL-NALOXONE HCL 8-2 MG SL SUBL
1.0000 | SUBLINGUAL_TABLET | Freq: Two times a day (BID) | SUBLINGUAL | Status: DC
  Administered 2019-02-16: 1 via SUBLINGUAL
  Filled 2019-02-16: qty 1

## 2019-02-16 MED ORDER — SODIUM POLYSTYRENE SULFONATE 15 GM/60ML PO SUSP
15.0000 g | Freq: Once | ORAL | Status: AC
  Administered 2019-02-16: 15 g
  Filled 2019-02-16: qty 60

## 2019-02-16 MED ORDER — DEXTROSE 50 % IV SOLN
1.0000 | Freq: Once | INTRAVENOUS | Status: AC
  Administered 2019-02-16: 50 mL via INTRAVENOUS
  Filled 2019-02-16: qty 50

## 2019-02-16 MED ORDER — HYDROCORTISONE NA SUCCINATE PF 100 MG IJ SOLR
50.0000 mg | Freq: Two times a day (BID) | INTRAMUSCULAR | Status: DC
  Administered 2019-02-16 – 2019-02-17 (×2): 50 mg via INTRAVENOUS
  Filled 2019-02-16 (×2): qty 2

## 2019-02-16 MED ORDER — SODIUM CHLORIDE 0.9 % IV SOLN
2.0000 g | INTRAVENOUS | Status: DC
  Administered 2019-02-16: 2 g via INTRAVENOUS
  Filled 2019-02-16: qty 20

## 2019-02-16 MED ORDER — ACETAMINOPHEN 325 MG PO TABS
650.0000 mg | ORAL_TABLET | Freq: Four times a day (QID) | ORAL | Status: DC | PRN
  Administered 2019-02-17 – 2019-02-24 (×8): 650 mg via ORAL
  Filled 2019-02-16 (×8): qty 2

## 2019-02-16 MED ORDER — CALCIUM GLUCONATE-NACL 1-0.675 GM/50ML-% IV SOLN
1.0000 g | Freq: Once | INTRAVENOUS | Status: AC
  Administered 2019-02-16: 1000 mg via INTRAVENOUS
  Filled 2019-02-16: qty 50

## 2019-02-16 MED ORDER — INSULIN REGULAR HUMAN 100 UNIT/ML IJ SOLN
10.0000 [IU] | Freq: Once | INTRAMUSCULAR | Status: DC
  Filled 2019-02-16 (×2): qty 10

## 2019-02-16 NOTE — Progress Notes (Signed)
Report received from [place], [place].  All questions answered.  Pt in bed, bed locked and in low position.  Will continue to monitor.

## 2019-02-16 NOTE — Plan of Care (Signed)
Placed on high fowlers position, cuff deflated, suctioned orally and endotracheally and then extubated to 3 lpm o2 Heath

## 2019-02-16 NOTE — Progress Notes (Signed)
PHARMACY - PHYSICIAN COMMUNICATION CRITICAL VALUE ALERT - BLOOD CULTURE IDENTIFICATION (BCID)  Kylie Monroe is an 63 y.o. female who presented to [facility] on 02/15/2019 with a chief complaint of AMS s/t respiratory failure  Assessment:  CXR shows pneumonia, LA 2.1 >> 1.7, 4/4 GPC BCID strep pneumonia  Name of physician (or Provider) Contacted: Harlon Ditty  Current antibiotics: Azithromycin/cefepime  Changes to prescribed antibiotics recommended:  Recommendations accepted by provider -- Will switch to ceftriaxone 2g IV daily  Results for orders placed or performed during the hospital encounter of 02/15/19  Blood Culture ID Panel (Reflexed) (Collected: 02/15/2019  7:21 AM)  Result Value Ref Range   Enterococcus species NOT DETECTED NOT DETECTED   Listeria monocytogenes NOT DETECTED NOT DETECTED   Staphylococcus species NOT DETECTED NOT DETECTED   Staphylococcus aureus (BCID) NOT DETECTED NOT DETECTED   Streptococcus species DETECTED (A) NOT DETECTED   Streptococcus agalactiae NOT DETECTED NOT DETECTED   Streptococcus pneumoniae DETECTED (A) NOT DETECTED   Streptococcus pyogenes NOT DETECTED NOT DETECTED   Acinetobacter baumannii NOT DETECTED NOT DETECTED   Enterobacteriaceae species NOT DETECTED NOT DETECTED   Enterobacter cloacae complex NOT DETECTED NOT DETECTED   Escherichia coli NOT DETECTED NOT DETECTED   Klebsiella oxytoca NOT DETECTED NOT DETECTED   Klebsiella pneumoniae NOT DETECTED NOT DETECTED   Proteus species NOT DETECTED NOT DETECTED   Serratia marcescens NOT DETECTED NOT DETECTED   Haemophilus influenzae NOT DETECTED NOT DETECTED   Neisseria meningitidis NOT DETECTED NOT DETECTED   Pseudomonas aeruginosa NOT DETECTED NOT DETECTED   Candida albicans NOT DETECTED NOT DETECTED   Candida glabrata NOT DETECTED NOT DETECTED   Candida krusei NOT DETECTED NOT DETECTED   Candida parapsilosis NOT DETECTED NOT DETECTED   Candida tropicalis NOT DETECTED NOT DETECTED     Thomasene Ripple 02/16/2019  12:33 AM

## 2019-02-16 NOTE — Progress Notes (Signed)
Request sent to rounding MD to add GI prophylaxis to this patient's profile. Dr Sung Amabile promptly added requested med.

## 2019-02-16 NOTE — Progress Notes (Signed)
Extubated onto 3L [place]- tolerated well

## 2019-02-16 NOTE — Progress Notes (Signed)
Passed SBT this morning and extubated.  Appears to be tolerating presently.  Remains somnolent.  Weaning vasopressors off.  Urine output excellent.  No overt distress.  Vitals:   02/16/19 1300 02/16/19 1359 02/16/19 1400 02/16/19 1500  BP: (!) 93/55  (!) 93/57 (!) 96/52  Pulse: 85  87 92  Resp: (!) 21  (!) 23 (!) 24  Temp:      TempSrc:      SpO2: 94% 95% 95% 94%  Weight:      Height:      [place] 3 LPM  Frail and chronically ill-appearing Temporal wasting Edentulous No JVD Scattered rhonchi, no wheezes Regular, no M NABS, soft Extremities warm, no edema Cranial nerves intact, moves all extremities  BMP Latest Ref Rng & Units 02/16/2019 02/15/2019 11/05/2018  Glucose 70 - 99 mg/dL 937(J) 696(V) 893(Y)  BUN 6 - 20 mg/dL 18 10(F) 16  Creatinine 0.44 - 1.00 mg/dL 7.51 0.25 <8.52(D)  Sodium 135 - 145 mmol/L 135 132(L) 135  Potassium 3.5 - 5.1 mmol/L 5.8(H) 3.4(L) 3.8  Chloride 98 - 111 mmol/L 104 96(L) 95(L)  CO2 22 - 32 mmol/L 23 28 32  Calcium 8.9 - 10.3 mg/dL 7.6(L) 8.5(L) 8.2(L)   CBC Latest Ref Rng & Units 02/16/2019 02/15/2019 11/05/2018  WBC 4.0 - 10.5 K/uL 7.7 7.9 3.8(L)  Hemoglobin 12.0 - 15.0 g/dL 11.0(L) 11.6(L) 9.9(L)  Hematocrit 36.0 - 46.0 % 35.2(L) 36.1 30.9(L)  Platelets 150 - 400 K/uL 72(L) 81(L) 160   Results for orders placed or performed during the hospital encounter of 02/15/19  Urine culture     Status: None   Collection Time: 02/15/19  7:20 AM  Result Value Ref Range Status   Specimen Description   Final    URINE, RANDOM Performed at [facility], [address] [place], [place] [address]    Special Requests   Final    NONE Performed at [facility], [address], [place], [place] [address]    Culture   Final    NO GROWTH Performed at [facility], [address], [place], [place] [address]    Report Status 02/16/2019 FINAL  Final  Blood Culture (routine x 2)     Status: None (Preliminary result)   Collection Time:  02/15/19  7:21 AM  Result Value Ref Range Status   Specimen Description BLOOD RIGHT ANTECUBITAL  Final   Special Requests   Final    BOTTLES DRAWN AEROBIC AND ANAEROBIC Blood Culture results may not be optimal due to an excessive volume of blood received in culture bottles   Culture  Setup Time   Final    Organism ID to follow GRAM POSITIVE COCCI IN PAIRS AND CHAINS IN BOTH AEROBIC AND ANAEROBIC BOTTLES CRITICAL RESULT CALLED TO, READ BACK BY AND VERIFIED WITH: Damiana Berrian BESANTI 02/15/19 @ 2224  MLK Performed at [facility], [address] [place], [place] [address]    Culture GRAM POSITIVE COCCI IN PAIRS AND CHAINS  Final   Report Status PENDING  Incomplete  Blood Culture (routine x 2)     Status: None (Preliminary result)   Collection Time: 02/15/19  7:21 AM  Result Value Ref Range Status   Specimen Description BLOOD BLOOD RIGHT FOREARM  Final   Special Requests   Final    BOTTLES DRAWN AEROBIC AND ANAEROBIC Blood Culture results may not be optimal due to an excessive volume of blood received in culture bottles   Culture  Setup Time  Final    Organism ID to follow GRAM POSITIVE COCCI IN PAIRS AND CHAINS IN BOTH AEROBIC AND ANAEROBIC BOTTLES CRITICAL RESULT CALLED TO, READ BACK BY AND VERIFIED WITH: Dwain Huhn BASANTI 02/15/19 @ 2224  MLK Performed at [facility], [address] [place], [place] [address]    Culture GRAM POSITIVE COCCI IN PAIRS AND CHAINS  Final   Report Status PENDING  Incomplete  Blood Culture ID Panel (Reflexed)     Status: Abnormal   Collection Time: 02/15/19  7:21 AM  Result Value Ref Range Status   Enterococcus species NOT DETECTED NOT DETECTED Final   Listeria monocytogenes NOT DETECTED NOT DETECTED Final   Staphylococcus species NOT DETECTED NOT DETECTED Final   Staphylococcus aureus (BCID) NOT DETECTED NOT DETECTED Final   Streptococcus species DETECTED (A) NOT DETECTED Final    Comment: CRITICAL RESULT CALLED TO, READ BACK BY AND  VERIFIED WITH: Mylynn Dinh BESANTI 02/15/19 @ 2224  MLK    Streptococcus agalactiae NOT DETECTED NOT DETECTED Final   Streptococcus pneumoniae DETECTED (A) NOT DETECTED Final    Comment: CRITICAL RESULT CALLED TO, READ BACK BY AND VERIFIED WITH: Tevan Marian BESANTI 02/15/19 @ 2224  MLK    Streptococcus pyogenes NOT DETECTED NOT DETECTED Final   Acinetobacter baumannii NOT DETECTED NOT DETECTED Final   Enterobacteriaceae species NOT DETECTED NOT DETECTED Final   Enterobacter cloacae complex NOT DETECTED NOT DETECTED Final   Escherichia coli NOT DETECTED NOT DETECTED Final   Klebsiella oxytoca NOT DETECTED NOT DETECTED Final   Klebsiella pneumoniae NOT DETECTED NOT DETECTED Final   Proteus species NOT DETECTED NOT DETECTED Final   Serratia marcescens NOT DETECTED NOT DETECTED Final   Haemophilus influenzae NOT DETECTED NOT DETECTED Final   Neisseria meningitidis NOT DETECTED NOT DETECTED Final   Pseudomonas aeruginosa NOT DETECTED NOT DETECTED Final   Candida albicans NOT DETECTED NOT DETECTED Final   Candida glabrata NOT DETECTED NOT DETECTED Final   Candida krusei NOT DETECTED NOT DETECTED Final   Candida parapsilosis NOT DETECTED NOT DETECTED Final   Candida tropicalis NOT DETECTED NOT DETECTED Final    Comment: Performed at [facility], [address] [place], [place] [address]  MRSA PCR Screening     Status: None   Collection Time: 02/15/19 10:24 AM  Result Value Ref Range Status   MRSA by PCR NEGATIVE NEGATIVE Final    Comment:        The GeneXpert MRSA Assay (FDA approved for NASAL specimens only), is one component of a comprehensive MRSA colonization surveillance program. It is not intended to diagnose MRSA infection nor to guide or monitor treatment for MRSA infections. Performed at [facility], [address] [place], [place] [address]   Culture, respiratory (non-expectorated)     Status: None (Preliminary result)   Collection Time: 02/15/19 10:30 AM   Result Value Ref Range Status   Specimen Description   Final    TRACHEAL ASPIRATE Performed at [facility], [address] [place], [place] [address]    Special Requests   Final    NONE Performed at [facility], [address] [place], [place] [address]    Gram Stain   Final    ABUNDANT WBC PRESENT,BOTH PMN AND MONONUCLEAR FEW GRAM POSITIVE COCCI RARE GRAM POSITIVE RODS    Culture   Final    CULTURE REINCUBATED FOR BETTER GROWTH Performed at [facility], [address], [place], [place] [address]    Report Status PENDING  Incomplete   Anti-infectives (From admission, onward)   Start     Dose/Rate Route Frequency Ordered Stop   02/17/19 1000  cefTRIAXone (ROCEPHIN) 2 g in sodium chloride 0.9 % 100 mL IVPB     2 g 200 mL/hr over 30 Minutes Intravenous Every 24 hours 02/16/19 1011     02/16/19 0700  cefTRIAXone (ROCEPHIN) 2 g in sodium chloride 0.9 % 100 mL IVPB  Status:  Discontinued     2 g 200 mL/hr over 30 Minutes Intravenous Every 24 hours 02/16/19 0033 02/16/19 1011   02/15/19 2000  ceFEPIme (MAXIPIME) 2 g in sodium chloride 0.9 % 100 mL IVPB  Status:  Discontinued     2 g 200 mL/hr over 30 Minutes Intravenous Every 12 hours 02/15/19 1029 02/16/19 0033   02/15/19 1130  azithromycin (ZITHROMAX) 500 mg in sodium chloride 0.9 % 250 mL IVPB  Status:  Discontinued     500 mg 250 mL/hr over 60 Minutes Intravenous Every 24 hours 02/15/19 1023 02/16/19 0033   02/15/19 0700  vancomycin (VANCOCIN) 1,500 mg in sodium chloride 0.9 % 500 mL IVPB     1,500 mg 250 mL/hr over 120 Minutes Intravenous  Once 02/15/19 0658 02/15/19 1039   02/15/19 0645  vancomycin (VANCOCIN) IVPB 1000 mg/200 mL premix  Status:  Discontinued     1,000 mg 200 mL/hr over 60 Minutes Intravenous  Once 02/15/19 0644 02/15/19 0658   02/15/19 0645  ceFEPIme (MAXIPIME) 2 g in sodium chloride 0.9 % 100 mL IVPB     2 g 200 mL/hr over 30 Minutes Intravenous  Once 02/15/19 0644 02/15/19  0806     CXR: NSC extensive infiltrate involving LUL, LLL  IMPRESSION: Pneumococcal pneumonia Severe sepsis with septic shock Acute hypoxemic respiratory failure VDRF, appears resolved AKI, resolving Hyperkalemia Thrombocytopenia History of substance abuse UDS positive for multiple substances on admission Acute encephalopathy  PLAN/REC: Extubated under my supervision this morning Monitor and ICU/SDU post extubation Supplemental oxygen as needed to maintain SPO2 >90% Monitor BMET intermittently Monitor I/Os Correct electrolytes as indicated Monitor temp, WBC count Micro and abx as above DVT px: Enoxaparin Monitor CBC intermittently Transfuse per usual guidelines Watch platelet count closely.  Might have to stop LMWH Suboxone ordered Discontinue all other sedatives Advance activity and nutrition as able  CCM time: 40 mins The above time includes time spent in consultation with patient and/or family members and reviewing care plan on multidisciplinary rounds.  In addition, I have checked on her multiple times post extubation  Billy Fischer, MD PCCM service Mobile [phone] Pager [phone] 02/16/2019 3:42 PM

## 2019-02-16 NOTE — Progress Notes (Signed)
[employer] - [place] at [facility]   PATIENT NAME: Kylie Monroe    MR#:  [id number]  DATE OF BIRTH:  27-Mar-1956  SUBJECTIVE:  CHIEF COMPLAINT:   Chief Complaint  Patient presents with  . Altered Mental Status   The patient was extubated this morning.  Still confused, on Levophed drip. REVIEW OF SYSTEMS:  Review of Systems  Unable to perform ROS: Mental status change    DRUG ALLERGIES:  No Known Allergies VITALS:  Blood pressure (!) 96/52, pulse 92, temperature 97.6 F (36.4 C), temperature source Axillary, resp. rate (!) 24, height 5' (1.524 m), weight 60.4 kg, SpO2 94 %. PHYSICAL EXAMINATION:  Physical Exam Constitutional:      Comments: Lethargic.  HENT:     Head: Normocephalic.     Mouth/Throat:     Mouth: Mucous membranes are dry.  Eyes:     General: No scleral icterus.    Conjunctiva/sclera: Conjunctivae normal.     Pupils: Pupils are equal, round, and reactive to light.  Neck:     Musculoskeletal: Normal range of motion and neck supple.     Vascular: No JVD.     Trachea: No tracheal deviation.  Cardiovascular:     Rate and Rhythm: Normal rate and regular rhythm.     Heart sounds: Normal heart sounds. No murmur. No gallop.   Pulmonary:     Effort: Pulmonary effort is normal. No respiratory distress.     Breath sounds: No wheezing or rales.     Comments: Bilateral crackles. Abdominal:     General: Bowel sounds are normal. There is no distension.     Palpations: Abdomen is soft.     Tenderness: There is no abdominal tenderness. There is no rebound.  Musculoskeletal: Normal range of motion.        General: No tenderness.  Skin:    Findings: No erythema or rash.  Neurological:     Comments: Unable to exam.  Not follow commands.    LABORATORY PANEL:  Female CBC Recent Labs  Lab 02/16/19 0532  WBC 7.7  HGB 11.0*  HCT 35.2*  PLT 72*    ------------------------------------------------------------------------------------------------------------------ Chemistries  Recent Labs  Lab 02/16/19 0532  NA 135  K 5.8*  CL 104  CO2 23  GLUCOSE 135*  BUN 18  CREATININE 0.57  CALCIUM 7.6*  AST 71*  ALT 33  ALKPHOS 53  BILITOT 2.9*   RADIOLOGY:  Dg Chest Port 1 View  Result Date: 02/16/2019 CLINICAL DATA:  Hypoxia EXAM: PORTABLE CHEST 1 VIEW COMPARISON:  February 15, 2019 FINDINGS: Endotracheal tube tip is 3.2 cm above the carina. Nasogastric tube tip and side port are below the diaphragm. No pneumothorax. There is widespread airspace opacity throughout the left lung as well as underlying interstitial edema bilaterally, more severe on the left than on the right. No new opacity is evident. There is persistent cardiomegaly with pulmonary venous hypertension. No adenopathy evident. Old trauma involving the lateral left clavicle noted. IMPRESSION: Tube positions as described without pneumothorax. Pulmonary vascular congestion with underlying edema. Widespread airspace opacity on the left may represent alveolar edema or superimposed pneumonia. Both entities may be present concurrently. The appearance is stable compared to 1 day prior. Electronically Signed   By: Bretta Bang III M.D.   On: 02/16/2019 07:10   Dg Chest Port 1 View  Result Date: 02/15/2019 CLINICAL DATA:  63 y/o  F; status post intubation. EXAM: PORTABLE CHEST 1 VIEW COMPARISON:  02/15/2019 chest radiograph  FINDINGS: Stable cardiomegaly given projection and technique. Endotracheal tube tip projects 3.1 cm above the carina. Enteric tube tip extends below the field of view into the abdomen. Interval mild improved aeration of the right lung. Stable diffuse patchy opacities, greatest in left lung. No pleural effusion or pneumothorax. Chronic distal left clavicle fracture deformity is stable. No acute osseous abnormality is evident. IMPRESSION: Endotracheal tube tip projects 3.1  cm above the carina. Interval partial improved aeration of right lung. Stable diffuse infiltrates of the left lung. Electronically Signed   By: Mitzi Hansen M.D.   On: 02/15/2019 22:55   ASSESSMENT AND PLAN:   63 year old female with past medical history of substance abuse, hypertension, hepatitis C, COPD with ongoing tobacco abuse who presented to the hospital due to altered mental status and hypoxia.  1.  Altered mental status- suspected to be metabolic encephalopathy secondary to underlying sepsis. The patient was intubated and sedated, S/p extubation.  On oxygen by nasal cannula 3 L.  Continue nebulizer.  2.  Sepsis-patient meets criteria given her elevated lactic acid, tachycardia and chest x-ray findings suggestive of left-sided pneumonia. She was on vancomycin, cefepime.    Blood culture report strep pneumonia.  Changed to Rocephin, Continue IV fluids and Levophed.  3.  Acute respiratory failure with hypoxia- secondary to pneumonia. She was on broad-spectrum IV antibiotics with vancomycin, cefepime.   Continue bronchodilators and also IV steroids. S/p extubation.  On oxygen by nasal cannula 3 L.  Continue nebulizer.  4.  COPD-acute exacerbation secondary to pneumonia. Continue IV steroids, scheduled duo nebs and mpiric antibiotics as stated above.  5.  Thrombocytopenia-secondary to patient's history of hepatitis C.  Follow-up CBC.  Hyperkalemia.  IV fluid support and follow-up K. Substance abuse including cocaine abuse.  Cessation counseling after mental status improvement.  All the records are reviewed and case discussed with Care Management/Social Worker. Management plans discussed with the patient, family and they are in agreement.  CODE STATUS: Full Code  TOTAL TIME TAKING CARE OF THIS PATIENT: 35 minutes.   More than 50% of the time was spent in counseling/coordination of care: YES  POSSIBLE D/C IN 3 DAYS, DEPENDING ON CLINICAL CONDITION.   Shaune Pollack  M.D on 02/16/2019 at 3:39 PM  Between 7am to 6pm - Pager - [phone]  After 6pm go to [email or web address] - Therapist, nutritional Hospitalists

## 2019-02-17 ENCOUNTER — Encounter: Payer: Self-pay | Admitting: Pulmonary Disease

## 2019-02-17 ENCOUNTER — Inpatient Hospital Stay: Payer: Medicaid Other

## 2019-02-17 LAB — CBC
HCT: 36.1 % (ref 36.0–46.0)
Hemoglobin: 11.4 g/dL — ABNORMAL LOW (ref 12.0–15.0)
MCH: 29.2 pg (ref 26.0–34.0)
MCHC: 31.6 g/dL (ref 30.0–36.0)
MCV: 92.3 fL (ref 80.0–100.0)
NRBC: 0 % (ref 0.0–0.2)
Platelets: 88 10*3/uL — ABNORMAL LOW (ref 150–400)
RBC: 3.91 MIL/uL (ref 3.87–5.11)
RDW: 16.9 % — AB (ref 11.5–15.5)
WBC: 8.9 10*3/uL (ref 4.0–10.5)

## 2019-02-17 LAB — BASIC METABOLIC PANEL
Anion gap: 7 (ref 5–15)
BUN: 20 mg/dL (ref 6–20)
CO2: 28 mmol/L (ref 22–32)
Calcium: 8.3 mg/dL — ABNORMAL LOW (ref 8.9–10.3)
Chloride: 107 mmol/L (ref 98–111)
Creatinine, Ser: 0.55 mg/dL (ref 0.44–1.00)
GFR calc non Af Amer: >60 mL/min (ref >60)
Glucose, Bld: 128 mg/dL — ABNORMAL HIGH (ref 70–99)
Potassium: 2.7 mmol/L — CL (ref 3.5–5.1)
SODIUM: 142 mmol/L (ref 135–145)

## 2019-02-17 LAB — GLUCOSE, CAPILLARY: Glucose-Capillary: 101 mg/dL — ABNORMAL HIGH (ref 70–99)

## 2019-02-17 MED ORDER — POTASSIUM CHLORIDE 10 MEQ/50ML IV SOLN
10.0000 meq | INTRAVENOUS | Status: AC
  Administered 2019-02-17 (×5): 10 meq via INTRAVENOUS
  Filled 2019-02-17 (×5): qty 50

## 2019-02-17 MED ORDER — POTASSIUM CHLORIDE 2 MEQ/ML IV SOLN
INTRAVENOUS | Status: DC
  Administered 2019-02-17: 14:00:00 via INTRAVENOUS
  Filled 2019-02-17 (×3): qty 1000

## 2019-02-17 MED ORDER — LORAZEPAM 2 MG/ML IJ SOLN: 0.5000 mg | INTRAMUSCULAR | Status: DC | PRN

## 2019-02-17 MED ORDER — ZIPRASIDONE MESYLATE 20 MG IM SOLR
10.0000 mg | Freq: Once | INTRAMUSCULAR | Status: AC
  Administered 2019-02-17: 10 mg via INTRAMUSCULAR
  Filled 2019-02-17: qty 20

## 2019-02-17 MED ORDER — LORAZEPAM 2 MG/ML IJ SOLN
0.5000 mg | INTRAMUSCULAR | Status: DC | PRN
  Administered 2019-02-17: 1 mg via INTRAVENOUS
  Administered 2019-02-17: 0.5 mg via INTRAVENOUS
  Administered 2019-02-17 – 2019-02-18 (×2): 1 mg via INTRAVENOUS
  Filled 2019-02-17 (×4): qty 1

## 2019-02-17 MED ORDER — BUPRENORPHINE HCL-NALOXONE HCL 2-0.5 MG SL SUBL
1.0000 | SUBLINGUAL_TABLET | Freq: Two times a day (BID) | SUBLINGUAL | Status: DC
  Administered 2019-02-17 – 2019-02-18 (×3): 1 via SUBLINGUAL
  Filled 2019-02-17 (×4): qty 1

## 2019-02-17 NOTE — TOC Initial Note (Signed)
Transition of Care [facility]) - Initial/Assessment Note    Patient Details  Name: Kylie Monroe MRN: [id number] Date of Birth: 19-Jun-1956  Transition of Care [facility]) CM/SW Contact:    Allayne Butcher, RN Phone Number: 02/17/2019, 11:12 AM  Clinical Narrative:                 Patient admitted for acute respiratory failure requiring intubation.  Patient extubated yesterday to [place].  Patient is on chronic O2 3L at home.  Patient lives with her significant other, Earnest Rosier.  Duanne Guess reports they have been together for 30 years.  Patient has a daughter named [name] who is the next of kin.  Patient has had home health services in the past with [facility]. Duanne Guess reports that home health was not very helpful but they may be interested when time for patient to be discharged.  Duanne Guess reports that at home patient is independent.    Expected Discharge Plan: Home w Home Health Services     Patient Goals and CMS Choice        Expected Discharge Plan and Services Expected Discharge Plan: Home w Home Health Services Discharge Planning Services: CM Consult   Living arrangements for the past 2 months: Single Family Home                          Prior Living Arrangements/Services Living arrangements for the past 2 months: Single Family Home Lives with:: Significant Other   Do you feel safe going back to the place where you live?: Yes      Need for Family Participation in Patient Care: Yes (Comment) Care giver support system in place?: Yes (comment) Current home services: DME    Activities of Daily Living      Permission Sought/Granted                  Emotional Assessment Appearance:: Appears older than stated age Attitude/Demeanor/Rapport: Lethargic, Sedated Affect (typically observed): Unable to Assess   Alcohol / Substance Use: Illicit Drugs, Alcohol Use, Tobacco Use Psych Involvement: No (comment)  Admission diagnosis:  Metabolic encephalopathy  [G93.41] Healthcare-associated pneumonia [J18.9] Septic shock (HCC) [A41.9, R65.21] Patient Active Problem List   Diagnosis Date Noted  . Substance abuse (HCC)   . Pressure injury of skin 11/02/2018  . DNR (do not resuscitate) discussion   . Palliative care by specialist   . Malnutrition of moderate degree 10/26/2018  . Pneumonia   . Septic shock (HCC)   . Acute respiratory failure with hypoxia (HCC) 10/24/2018  . Leukopenia 01/24/2018  . Thrombocytopenia (HCC) 01/24/2018  . Other pancytopenia (HCC) 12/25/2017  . Iron deficiency anemia 12/25/2017   PCP:  Donaciano Eva, FNP Pharmacy:   [facility] [id number] - [place], [place] - [address] [place] [address] Phone: [phone] Fax: [phone]     Social Determinants of Health (SDOH) Interventions    Readmission Risk Interventions 30 Day Unplanned Readmission Risk Score     ED to Hosp-Admission (Current) from 02/15/2019 in [facility] ICU/CCU  30 Day Unplanned Readmission Risk Score (%)  24 Filed at 02/17/2019 0800     This score is the patient's risk of an unplanned readmission within 30 days of being discharged (0 -100%). The score is based on dignosis, age, lab data, medications, orders, and past utilization.   Low:  0-14.9   Medium: 15-21.9   High: 22-29.9  Extreme: 30 and above       No flowsheet data found.

## 2019-02-17 NOTE — Plan of Care (Signed)
Pt k+ level 2.7 will receive replacement as ordered, cont on precedex gtt, levo gtt. Still agitated and restless. Free of fall.

## 2019-02-17 NOTE — Evaluation (Signed)
Clinical/Bedside Swallow Evaluation Patient Details  Name: Kylie Monroe MRN: [id number] Date of Birth: 06-10-1956  Today's Date: 02/17/2019 Time: SLP Start Time (ACUTE ONLY): 1200 SLP Stop Time (ACUTE ONLY): 1300 SLP Time Calculation (min) (ACUTE ONLY): 60 min  Past Medical History:  Past Medical History:  Diagnosis Date  . COPD (chronic obstructive pulmonary disease) (HCC)   . Hepatitis C   . Hypertension    Past Surgical History:  Past Surgical History:  Procedure Laterality Date  . CHOLECYSTECTOMY    . prolapsed rectum     HPI:  Pt is a 63 y.o. female with a known history of COPD, hepatitis C, history of heroin abuse on Suboxone program, hypertension who presented to the hospital due to altered mental status and hypoxia.  Patient is presently intubated and sedated therefore most of the history obtained from the chart and from the ER physician.  Patient's boyfriend apparently found her unresponsive this morning and therefore called EMS.  EMS on arrival noted that the patient was significantly hypoxic with O2 sats in the high 70s to low 80s.  Patient was urgently brought to the ER noted to be in acute respiratory failure with hypoxia.  Patient's chest x-ray findings are suggestive of left-sided pneumonia and patient was started on broad-spectrum antibiotics and given IV fluids.  While in the ER patient became quite agitated and would not follow any commands and was very restless and therefore was intubated for airway protection and agitation.  Pt was extubated yesterday and is tolerating this well maintaining airway and vitals per NSG.    Assessment / Plan / Recommendation Clinical Impression  Pt appears to present w/, and concern for, oropharyngeal phase dysphagia d/t declined medical and Cognitive status' at this time. Pt is Edentulous, agitated and easily distracted, and also not eager to participate w/ trials - she is at increased risk for aspiration at this time d/t presentation  overall.  Pt consumed few po trials of ice chips, thin liquids x1, Nectar consistency liquids, and purees. Immediate coughing noted w/ trial sip of thin liquids. No immediate, overt s/s of aspiration noted w/ trials of Nectar liquids, purees and ice chips; no decline in vocal quality when pt verbalized; no decline in HR/RR or O2 sats during/post po trials from her baseline. During the oral phase, pt demonstrated adequate bolus management and timely A-P transfer when swallowing of po's - pt was min distracted and not fully attending to bolus acceptance w/ few trials. Pt was too fidgity/slightly agitated to continue w/ po trials and declined further trials. Pt required full feeding; verbal cues during tasks as she initiated and engaged in the tasks. During the bolus management, OM movements were adequate w/ no bolus loss - difficult to further assess OM strength/ROM d/t pt's lack of participation; and Edentulous status appeared to impact. Due to pt's current presentation, recommend initiation of a Dysphagia level 1 (Puree) diet w/ Nectar consistency liquids; aspiration precautions; feeding support and Supervision at all meals. Recommend pills in puree Crushed as able. ST services will f/u w/ toleration of diet and trials to advance diet consistency as appropriate. NSG updated.  SLP Visit Diagnosis: Dysphagia, oropharyngeal phase (R13.12)    Aspiration Risk  Moderate aspiration risk    Diet Recommendation  Dysphagia level 1 (puree) w/ Nectar consistency liquids; aspiration precautions; Supervision w/ all po's, meals  Medication Administration: Crushed with puree(as able)    Other  Recommendations Recommended Consults: (Dietician f/u) Oral Care Recommendations: Oral care BID;Staff/trained caregiver to  provide oral care Other Recommendations: Order thickener from pharmacy;Remove water pitcher;Prohibited food (jello, ice cream, thin soups);Have oral suction available   Follow up Recommendations Skilled  Nursing facility(TBD)      Frequency and Duration min 3x week  2 weeks       Prognosis Prognosis for Safe Diet Advancement: Fair Barriers to Reach Goals: Cognitive deficits;Behavior(baseline Drug Abuse)      Swallow Study   General Date of Onset: 02/15/19 HPI: Pt is a 63 y.o. female with a known history of COPD, hepatitis C, history of heroin abuse on Suboxone program, hypertension who presented to the hospital due to altered mental status and hypoxia.  Patient is presently intubated and sedated therefore most of the history obtained from the chart and from the ER physician.  Patient's boyfriend apparently found her unresponsive this morning and therefore called EMS.  EMS on arrival noted that the patient was significantly hypoxic with O2 sats in the high 70s to low 80s.  Patient was urgently brought to the ER noted to be in acute respiratory failure with hypoxia.  Patient's chest x-ray findings are suggestive of left-sided pneumonia and patient was started on broad-spectrum antibiotics and given IV fluids.  While in the ER patient became quite agitated and would not follow any commands and was very restless and therefore was intubated for airway protection and agitation.  Pt was extubated yesterday and is tolerating this well maintaining airway and vitals per NSG.  Type of Study: Bedside Swallow Evaluation Previous Swallow Assessment: none reported Diet Prior to this Study: NPO(regular diet at home per pt) Temperature Spikes Noted: No(wbc 8.9) Respiratory Status: Nasal cannula(2-3 liters ) History of Recent Intubation: Yes Length of Intubations (days): 1 days Date extubated: 02/16/19 Behavior/Cognition: Confused;Agitated;Distractible;Requires cueing(Awake) Oral Cavity Assessment: (could not assess d/t cognitive status) Oral Care Completed by SLP: Yes(attempted) Oral Cavity - Dentition: Edentulous Vision: (n/a) Self-Feeding Abilities: Total assist Patient Positioning: Upright in  bed(needed positioning) Baseline Vocal Quality: Low vocal intensity(mumbled speech) Volitional Cough: Cognitively unable to elicit Volitional Swallow: Unable to elicit    Oral/Motor/Sensory Function Overall Oral Motor/Sensory Function: (difficult to assess d/t cognitive status)   Ice Chips Ice chips: Within functional limits(grossly) Presentation: Spoon(fed; 6 trials) Other Comments: throat clear x1 - stated it was "scratchy"   Thin Liquid Thin Liquid: Impaired Presentation: Cup(fed by SLP; 1 trial) Oral Phase Impairments: Reduced labial seal;Poor awareness of bolus Oral Phase Functional Implications: (none) Pharyngeal  Phase Impairments: Cough - Immediate Other Comments: no further trials given    Nectar Thick Nectar Thick Liquid: Within functional limits(grossly) Presentation: Spoon(8 trials accepted) Other Comments: pt was too distracted and would not accept further po's   Honey Thick Honey Thick Liquid: Not tested   Puree Puree: Within functional limits Presentation: Spoon(fed; 4 trials) Other Comments: refused further   Solid     Solid: Not tested      Jerilynn Som, MS, CCC-SLP Watson,Katherine 02/17/2019,2:44 PM

## 2019-02-17 NOTE — Progress Notes (Signed)
[employer] - [place] at [facility]   PATIENT NAME: Kylie Monroe    MR#:  [id number]  DATE OF BIRTH:  08-28-1956  SUBJECTIVE:  CHIEF COMPLAINT:   Chief Complaint  Patient presents with  . Altered Mental Status   The patient is still confused and lethargic, on Levophed drip. REVIEW OF SYSTEMS:  Review of Systems  Unable to perform ROS: Mental status change    DRUG ALLERGIES:  No Known Allergies VITALS:  Blood pressure 106/72, pulse 94, temperature 97.8 F (36.6 C), temperature source Axillary, resp. rate (!) 38, height 5' (1.524 m), weight 60.4 kg, SpO2 93 %. PHYSICAL EXAMINATION:  Physical Exam Constitutional:      Comments: Lethargic.  HENT:     Head: Normocephalic.     Mouth/Throat:     Mouth: Mucous membranes are dry.  Eyes:     General: No scleral icterus.    Conjunctiva/sclera: Conjunctivae normal.     Pupils: Pupils are equal, round, and reactive to light.  Neck:     Musculoskeletal: Normal range of motion and neck supple.     Vascular: No JVD.     Trachea: No tracheal deviation.  Cardiovascular:     Rate and Rhythm: Normal rate and regular rhythm.     Heart sounds: Normal heart sounds. No murmur. No gallop.   Pulmonary:     Effort: Pulmonary effort is normal. No respiratory distress.     Breath sounds: No wheezing or rales.     Comments: Bilateral crackles. Abdominal:     General: Bowel sounds are normal. There is no distension.     Palpations: Abdomen is soft.     Tenderness: There is no abdominal tenderness. There is no rebound.  Musculoskeletal: Normal range of motion.        General: No tenderness.  Skin:    Findings: No erythema or rash.  Neurological:     Comments: Unable to exam.  Not follow commands.    LABORATORY PANEL:  Female CBC Recent Labs  Lab 02/17/19 0441  WBC 8.9  HGB 11.4*  HCT 36.1  PLT 88*    ------------------------------------------------------------------------------------------------------------------ Chemistries  Recent Labs  Lab 02/16/19 0532  02/17/19 0441  NA 135   < > 142  K 5.8*   < > 2.7*  CL 104   < > 107  CO2 23   < > 28  GLUCOSE 135*   < > 128*  BUN 18   < > 20  CREATININE 0.57   < > 0.55  CALCIUM 7.6*   < > 8.3*  AST 71*  --   --   ALT 33  --   --   ALKPHOS 53  --   --   BILITOT 2.9*  --   --    < > = values in this interval not displayed.   RADIOLOGY:  Dg Chest Port 1 View  Result Date: 02/17/2019 CLINICAL DATA:  Respiratory failure EXAM: PORTABLE CHEST 1 VIEW COMPARISON:  02/16/2019 FINDINGS: Cardiac shadow is stable. Endotracheal tube and gastric catheter have been removed in the interval. Persistent opacity is noted throughout the left lung with evidence of vascular congestion and edema. The overall appearance is stable from the prior exam. IMPRESSION: Interval removal of tubes and lines. Otherwise stable appearance of the chest from the previous day. Electronically Signed   By: Alcide Clever M.D.   On: 02/17/2019 08:17   ASSESSMENT AND PLAN:   63 year old female with past medical history  of substance abuse, hypertension, hepatitis C, COPD with ongoing tobacco abuse who presented to the hospital due to altered mental status and hypoxia.  1.  Altered mental status- suspected to be metabolic encephalopathy secondary to underlying sepsis. The patient was intubated and sedated, S/p extubation.  On oxygen by nasal cannula 2 L.  Continue nebulizer.  2.  Sepsis-patient meets criteria given her elevated lactic acid, tachycardia and chest x-ray findings suggestive of left-sided pneumonia. She was on vancomycin, cefepime.    Blood culture report strep pneumonia.  Changed to Rocephin, Continue IV fluids and Levophed. Complete 7-10 days antibiotics per Dr. Sung Amabile.  3.  Acute respiratory failure with hypoxia- secondary to pneumonia. She was on broad-spectrum  IV antibiotics with vancomycin, cefepime.   Continue bronchodilators and also IV steroids. S/p extubation.  On oxygen by nasal cannula 2 L.  Continue nebulizer.  4.  COPD-acute exacerbation secondary to pneumonia. Disontinued IV steroids, scheduled duo nebs and mpiric antibiotics as stated above.  5.  Thrombocytopenia-secondary to patient's history of hepatitis C.  Follow-up CBC.  Hyperkalemia.  Improved. Hypokalemia. KCl supplement.  Substance abuse including cocaine abuse.  Cessation counseling after mental status improvement.  All the records are reviewed and case discussed with Care Management/Social Worker. Management plans discussed with the patient, family and they are in agreement.  CODE STATUS: Full Code  TOTAL TIME TAKING CARE OF THIS PATIENT: 35 minutes.   More than 50% of the time was spent in counseling/coordination of care: YES  POSSIBLE D/C IN 3 DAYS, DEPENDING ON CLINICAL CONDITION.   Shaune Pollack M.D on 02/17/2019 at 3:14 PM  Between 7am to 6pm - Pager - [phone]  After 6pm go to [email or web address] - Therapist, nutritional Hospitalists

## 2019-02-17 NOTE — Progress Notes (Signed)
Tolerating extubation well.  Lethargic with intermittent agitation.  Poorly oriented.  No overt respiratory distress  Vitals:   02/17/19 1200 02/17/19 1300 02/17/19 1333 02/17/19 1400  BP: 111/72 115/72  106/72  Pulse: 82 82  94  Resp: (!) 27 (!) 33  (!) 38  Temp: 97.8 F (36.6 C)     TempSrc: Axillary     SpO2: 93% 93% 94% 93%  Weight:      Height:      [place] 2 LPM  Frail and chronically ill-appearing Temporal wasting Edentulous No JVD Few left-sided rhonchi, no wheezes Regular, no M NABS, soft Extremities warm, no edema Cranial nerves intact, moves all extremities  BMP Latest Ref Rng & Units 02/17/2019 02/16/2019 02/16/2019  Glucose 70 - 99 mg/dL 409(W) 119(J) 478(G)  BUN 6 - 20 mg/dL Creatinine 0.44 - 1.00 mg/dL 9.56 2.13 0.86  Sodium 135 - 145 mmol/L 142 142 135  Potassium 3.5 - 5.1 mmol/L 2.7(LL) 3.0(L) 5.8(H)  Chloride 98 - 111 mmol/L 107 109 104  CO2 22 - 32 mmol/L Calcium 8.9 - 10.3 mg/dL 8.3(L) 8.1(L) 7.6(L)   CBC Latest Ref Rng & Units 02/17/2019 02/16/2019 02/15/2019  WBC 4.0 - 10.5 K/uL 8.9 7.7 7.9  Hemoglobin 12.0 - 15.0 g/dL 11.4(L) 11.0(L) 11.6(L)  Hematocrit 36.0 - 46.0 % 36.1 35.2(L) 36.1  Platelets 150 - 400 K/uL 88(L) 72(L) 81(L)   Results for orders placed or performed during the hospital encounter of 02/15/19  Urine culture     Status: None   Collection Time: 02/15/19  7:20 AM  Result Value Ref Range Status   Specimen Description   Final    URINE, RANDOM Performed at [facility], [address], [place], [place] [address]    Special Requests   Final    NONE Performed at [facility], [address] [place], [place] [address]    Culture   Final    NO GROWTH Performed at [facility], [address], [place], [place] [address]    Report Status 02/16/2019 FINAL  Final  Blood Culture (routine x 2)     Status: Abnormal (Preliminary result)   Collection Time: 02/15/19  7:21 AM  Result Value Ref Range  Status   Specimen Description   Final    BLOOD RIGHT ANTECUBITAL Performed at [facility], [address] [place], [place] [address]    Special Requests   Final    BOTTLES DRAWN AEROBIC AND ANAEROBIC Blood Culture results may not be optimal due to an excessive volume of blood received in culture bottles Performed at [facility], [address] [place], [place] [address]    Culture  Setup Time   Final    GRAM POSITIVE COCCI IN PAIRS AND CHAINS IN BOTH AEROBIC AND ANAEROBIC BOTTLES CRITICAL RESULT CALLED TO, READ BACK BY AND VERIFIED WITH: Cing  BESANTI 02/15/19 @ 2224  MLK    Culture (A)  Final    STREPTOCOCCUS PNEUMONIAE SUSCEPTIBILITIES TO FOLLOW Performed at [facility], [address] [place], [place] [address]    Report Status PENDING  Incomplete  Blood Culture (routine x 2)     Status: None (Preliminary result)   Collection Time: 02/15/19  7:21 AM  Result Value Ref Range Status   Specimen Description   Final    BLOOD BLOOD RIGHT FOREARM Performed at [facility], [address], [place], [place] [address]  Special Requests   Final    BOTTLES DRAWN AEROBIC AND ANAEROBIC Blood Culture results may not be optimal due to an excessive volume of blood received in culture bottles Performed at [facility], [address] [place], [place] [address]    Culture  Setup Time   Final    Organism ID to follow GRAM POSITIVE COCCI IN PAIRS AND CHAINS IN BOTH AEROBIC AND ANAEROBIC BOTTLES CRITICAL RESULT CALLED TO, READ BACK BY AND VERIFIED WITH: Lilburn Straw BASANTI 02/15/19 @ 2224  MLK Performed at [facility], [address], [place], [place] [address]    Culture   Final    GRAM POSITIVE COCCI IN PAIRS AND CHAINS IDENTIFICATION TO FOLLOW Performed at [facility], [address], [place], [place] [address]    Report Status PENDING  Incomplete  Blood Culture ID Panel (Reflexed)     Status: Abnormal   Collection Time:  02/15/19  7:21 AM  Result Value Ref Range Status   Enterococcus species NOT DETECTED NOT DETECTED Final   Listeria monocytogenes NOT DETECTED NOT DETECTED Final   Staphylococcus species NOT DETECTED NOT DETECTED Final   Staphylococcus aureus (BCID) NOT DETECTED NOT DETECTED Final   Streptococcus species DETECTED (A) NOT DETECTED Final    Comment: CRITICAL RESULT CALLED TO, READ BACK BY AND VERIFIED WITH: Krystian Younglove BESANTI 02/15/19 @ 2224  MLK    Streptococcus agalactiae NOT DETECTED NOT DETECTED Final   Streptococcus pneumoniae DETECTED (A) NOT DETECTED Final    Comment: CRITICAL RESULT CALLED TO, READ BACK BY AND VERIFIED WITH: Tally Mattox BESANTI 02/15/19 @ 2224  MLK    Streptococcus pyogenes NOT DETECTED NOT DETECTED Final   Acinetobacter baumannii NOT DETECTED NOT DETECTED Final   Enterobacteriaceae species NOT DETECTED NOT DETECTED Final   Enterobacter cloacae complex NOT DETECTED NOT DETECTED Final   Escherichia coli NOT DETECTED NOT DETECTED Final   Klebsiella oxytoca NOT DETECTED NOT DETECTED Final   Klebsiella pneumoniae NOT DETECTED NOT DETECTED Final   Proteus species NOT DETECTED NOT DETECTED Final   Serratia marcescens NOT DETECTED NOT DETECTED Final   Haemophilus influenzae NOT DETECTED NOT DETECTED Final   Neisseria meningitidis NOT DETECTED NOT DETECTED Final   Pseudomonas aeruginosa NOT DETECTED NOT DETECTED Final   Candida albicans NOT DETECTED NOT DETECTED Final   Candida glabrata NOT DETECTED NOT DETECTED Final   Candida krusei NOT DETECTED NOT DETECTED Final   Candida parapsilosis NOT DETECTED NOT DETECTED Final   Candida tropicalis NOT DETECTED NOT DETECTED Final    Comment: Performed at [facility], [address] [place], [place] [address]  MRSA PCR Screening     Status: None   Collection Time: 02/15/19 10:24 AM  Result Value Ref Range Status   MRSA by PCR NEGATIVE NEGATIVE Final    Comment:        The GeneXpert MRSA Assay (FDA approved for NASAL  specimens only), is one component of a comprehensive MRSA colonization surveillance program. It is not intended to diagnose MRSA infection nor to guide or monitor treatment for MRSA infections. Performed at [facility], [address] [place], [place] [address]   Culture, respiratory (non-expectorated)     Status: None (Preliminary result)   Collection Time: 02/15/19 10:30 AM  Result Value Ref Range Status   Specimen Description   Final    TRACHEAL ASPIRATE Performed at [facility], [address] [place], [place] [address]    Special Requests   Final  NONE Performed at [facility], [address] [place], [place] [address]    Gram Stain   Final    ABUNDANT WBC PRESENT,BOTH PMN AND MONONUCLEAR FEW GRAM POSITIVE COCCI RARE GRAM POSITIVE RODS Performed at [facility], [address] [place], [place] [address]    Culture ABUNDANT STREPTOCOCCUS PNEUMONIAE  Final   Report Status PENDING  Incomplete   Anti-infectives (From admission, onward)   Start     Dose/Rate Route Frequency Ordered Stop   02/17/19 1000  cefTRIAXone (ROCEPHIN) 2 g in sodium chloride 0.9 % 100 mL IVPB     2 g 200 mL/hr over 30 Minutes Intravenous Every 24 hours 02/16/19 1011     02/16/19 0700  cefTRIAXone (ROCEPHIN) 2 g in sodium chloride 0.9 % 100 mL IVPB  Status:  Discontinued     2 g 200 mL/hr over 30 Minutes Intravenous Every 24 hours 02/16/19 0033 02/16/19 1011   02/15/19 2000  ceFEPIme (MAXIPIME) 2 g in sodium chloride 0.9 % 100 mL IVPB  Status:  Discontinued     2 g 200 mL/hr over 30 Minutes Intravenous Every 12 hours 02/15/19 1029 02/16/19 0033   02/15/19 1130  azithromycin (ZITHROMAX) 500 mg in sodium chloride 0.9 % 250 mL IVPB  Status:  Discontinued     500 mg 250 mL/hr over 60 Minutes Intravenous Every 24 hours 02/15/19 1023 02/16/19 0033   02/15/19 0700  vancomycin (VANCOCIN) 1,500 mg in sodium chloride 0.9 % 500 mL IVPB     1,500 mg 250 mL/hr over  120 Minutes Intravenous  Once 02/15/19 0658 02/15/19 1039   02/15/19 0645  vancomycin (VANCOCIN) IVPB 1000 mg/200 mL premix  Status:  Discontinued     1,000 mg 200 mL/hr over 60 Minutes Intravenous  Once 02/15/19 0644 02/15/19 0658   02/15/19 0645  ceFEPIme (MAXIPIME) 2 g in sodium chloride 0.9 % 100 mL IVPB     2 g 200 mL/hr over 30 Minutes Intravenous  Once 02/15/19 0644 02/15/19 0806     CXR: NSC extensive infiltrate throughout on left  IMPRESSION: Pneumococcal pneumonia with bacteremia Severe sepsis with septic shock Acute hypoxemic respiratory failure VDRF, resolved AKI, resolved Hyperkalemia, resolved Hypokalemia Thrombocytopenia, stable to improved History of polysubstance abuse  UDS positive for multiple substances on admission Acute encephalopathy/TME Protein-calorie malnutrition  PLAN/REC: Monitor in SDU today Continue supplemental oxygen as needed to maintain SPO2 >90% Monitor BMET intermittently Monitor I/Os Correct electrolytes as indicated Monitor temp, WBC count Micro and abx as above Complete 7-10 days antibiotics DVT px: Enoxaparin Monitor CBC intermittently Transfuse per usual guidelines Suboxone is decreased Avoid or minimize all other sedatives Advance activity and nutrition as able   Kylie Fischer, MD PCCM service Mobile [phone] Pager [phone] 02/17/2019 2:36 PM

## 2019-02-17 NOTE — Progress Notes (Signed)
CRITICAL VALUE ALERT  Critical Value:  Potassium Level 2.7   Date & Time Notied:  02/17/2019@0600   Provider Notified:  Loney Laurence NP  Orders Received/Actions taken: see new orders

## 2019-02-18 ENCOUNTER — Inpatient Hospital Stay: Payer: Medicaid Other

## 2019-02-18 DIAGNOSIS — E43 Unspecified severe protein-calorie malnutrition: Secondary | ICD-10-CM

## 2019-02-18 DIAGNOSIS — F1910 Other psychoactive substance abuse, uncomplicated: Secondary | ICD-10-CM

## 2019-02-18 DIAGNOSIS — G9340 Encephalopathy, unspecified: Secondary | ICD-10-CM

## 2019-02-18 DIAGNOSIS — F1199 Opioid use, unspecified with unspecified opioid-induced disorder: Secondary | ICD-10-CM

## 2019-02-18 LAB — CULTURE, BLOOD (ROUTINE X 2)

## 2019-02-18 LAB — CULTURE, RESPIRATORY W GRAM STAIN

## 2019-02-18 LAB — BASIC METABOLIC PANEL
Anion gap: 6 (ref 5–15)
BUN: 24 mg/dL — AB (ref 6–20)
CO2: 29 mmol/L (ref 22–32)
CREATININE: 0.39 mg/dL — AB (ref 0.44–1.00)
Calcium: 8.8 mg/dL — ABNORMAL LOW (ref 8.9–10.3)
Chloride: 111 mmol/L (ref 98–111)
GFR calc non Af Amer: >60 mL/min (ref >60)
Glucose, Bld: 80 mg/dL (ref 70–99)
Potassium: 2.8 mmol/L — ABNORMAL LOW (ref 3.5–5.1)
Sodium: 146 mmol/L — ABNORMAL HIGH (ref 135–145)

## 2019-02-18 LAB — CBC
HCT: 33.6 % — ABNORMAL LOW (ref 36.0–46.0)
Hemoglobin: 10.3 g/dL — ABNORMAL LOW (ref 12.0–15.0)
MCH: 28.9 pg (ref 26.0–34.0)
MCHC: 30.7 g/dL (ref 30.0–36.0)
MCV: 94.1 fL (ref 80.0–100.0)
Platelets: 66 10*3/uL — ABNORMAL LOW (ref 150–400)
RBC: 3.57 MIL/uL — ABNORMAL LOW (ref 3.87–5.11)
RDW: 17.1 % — ABNORMAL HIGH (ref 11.5–15.5)
WBC: 4.5 10*3/uL (ref 4.0–10.5)
nRBC: 0 % (ref 0.0–0.2)

## 2019-02-18 MED ORDER — ORAL CARE MOUTH RINSE
15.0000 mL | Freq: Two times a day (BID) | OROMUCOSAL | Status: DC
  Administered 2019-02-18 – 2019-02-24 (×8): 15 mL via OROMUCOSAL

## 2019-02-18 MED ORDER — DEXMEDETOMIDINE HCL IN NACL 400 MCG/100ML IV SOLN
0.0000 ug/kg/h | INTRAVENOUS | Status: DC
  Administered 2019-02-18: 0.7 ug/kg/h via INTRAVENOUS
  Administered 2019-02-18: 0.4 ug/kg/h via INTRAVENOUS
  Administered 2019-02-19: 0.6 ug/kg/h via INTRAVENOUS
  Administered 2019-02-19: 0.7 ug/kg/h via INTRAVENOUS
  Administered 2019-02-20: 0.6 ug/kg/h via INTRAVENOUS
  Filled 2019-02-18 (×5): qty 100

## 2019-02-18 MED ORDER — POTASSIUM CHLORIDE 2 MEQ/ML IV SOLN
INTRAVENOUS | Status: DC
  Administered 2019-02-18 – 2019-02-20 (×3): via INTRAVENOUS
  Filled 2019-02-18 (×5): qty 1000

## 2019-02-18 NOTE — Progress Notes (Signed)
[employer] [facility] [place] [facility]   PATIENT NAME: Kylie Monroe    MR#:  [id number]  DATE OF BIRTH:  Jan 26, 1956  SUBJECTIVE:  CHIEF COMPLAINT:   Chief Complaint  Patient presents with  . Altered Mental Status   The patient is still confused and lethargic, on O2 [place] 3 L, off Levophed drip. REVIEW OF SYSTEMS:  Review of Systems  Unable to perform ROS: Mental status change    DRUG ALLERGIES:  No Known Allergies VITALS:  Blood pressure 94/63, pulse (!) 101, temperature 97.8 F (36.6 C), resp. rate (!) 31, height 5' (1.524 m), weight 60.4 kg, SpO2 96 %. PHYSICAL EXAMINATION:  Physical Exam Constitutional:      Comments: Lethargic.  HENT:     Head: Normocephalic.     Mouth/Throat:     Mouth: Mucous membranes are dry.  Eyes:     General: No scleral icterus.    Conjunctiva/sclera: Conjunctivae normal.     Pupils: Pupils are equal, round, and reactive to light.  Neck:     Musculoskeletal: Normal range of motion and neck supple.     Vascular: No JVD.     Trachea: No tracheal deviation.  Cardiovascular:     Rate and Rhythm: Normal rate and regular rhythm.     Heart sounds: Normal heart sounds. No murmur. No gallop.   Pulmonary:     Effort: Pulmonary effort is normal. No respiratory distress.     Breath sounds: No wheezing or rales.     Comments: Bilateral crackles. Abdominal:     General: Bowel sounds are normal. There is no distension.     Palpations: Abdomen is soft.     Tenderness: There is no abdominal tenderness. There is no rebound.  Musculoskeletal: Normal range of motion.        General: No tenderness.  Skin:    Findings: No erythema or rash.  Neurological:     Comments: Unable to exam.  Not follow commands.    LABORATORY PANEL:  Female CBC Recent Labs  Lab 02/18/19 0343  WBC 4.5  HGB 10.3*  HCT 33.6*  PLT 66*   ------------------------------------------------------------------------------------------------------------------  Chemistries  Recent Labs  Lab 02/16/19 0532  02/18/19 0343  NA 135   < > 146*  K 5.8*   < > 2.8*  CL 104   < > 111  CO2 23   < > 29  GLUCOSE 135*   < > 80  BUN 18   < > 24*  CREATININE 0.57   < > 0.39*  CALCIUM 7.6*   < > 8.8*  AST 71*  --   --   ALT 33  --   --   ALKPHOS 53  --   --   BILITOT 2.9*  --   --    < > = values in this interval not displayed.   RADIOLOGY:  Dg Chest Port 1 View  Result Date: 02/18/2019 CLINICAL DATA:  Acute respiratory failure EXAM: PORTABLE CHEST 1 VIEW COMPARISON:  Yesterday FINDINGS: Cardiomegaly and asymmetric extensive airspace disease that is diffusely confluent on the left. Hilar prominence which is likely vascular. No visible effusion. No pneumothorax IMPRESSION: Extensive pneumonia without interval progression. Electronically Signed   By: Marnee Spring M.D.   On: 02/18/2019 06:15   ASSESSMENT AND PLAN:   63 year old female with past medical history of substance abuse, hypertension, hepatitis C, COPD with ongoing tobacco abuse who presented to the hospital due to altered mental status and  hypoxia.  1.  Altered mental status- suspected to be metabolic encephalopathy secondary to underlying sepsis. The patient was intubated and sedated, S/p extubation.  On oxygen by nasal cannula 3 L.  Continue nebulizer.  2.  Sepsis due to pneumonia and bacteremia-patient meets criteria given her elevated lactic acid, tachycardia and chest x-ray findings suggestive of left-sided pneumonia. She was on vancomycin, cefepime.    Blood culture report strep pneumonia.  Changed to Rocephin. Hypotension improved with the biotics, IV fluids and Levophed. Complete 7-10 days antibiotics per Dr. Sung Amabile.  3.  Acute respiratory failure with hypoxia- secondary to pneumonia. She was on broad-spectrum IV antibiotics with vancomycin, cefepime.   Continue bronchodilators and also IV steroids. S/p extubation.  On oxygen by nasal cannula 3 L.  Continue nebulizer. To wean  off oxygen.  4.  COPD-acute exacerbation secondary to pneumonia. Disontinued IV steroids, scheduled duo nebs and mpiric antibiotics as stated above.  5.  Thrombocytopenia-secondary to patient's history of hepatitis C.  Follow-up CBC.  Hyperkalemia.  Improved. Hypokalemia.  Potassium is still low at 2.8, KCl supplement.  Substance abuse including cocaine abuse.  Cessation counseling after mental status improvement.  All the records are reviewed and case discussed with Care Management/Social Worker. Management plans discussed with the patient, family and they are in agreement.  CODE STATUS: Full Code  TOTAL TIME TAKING CARE OF THIS PATIENT: 27 minutes.   More than 50% of the time was spent in counseling/coordination of care: YES  POSSIBLE D/C IN 3 DAYS, DEPENDING ON CLINICAL CONDITION.   Shaune Pollack M.D on 02/18/2019 at 2:12 PM  Between 7am to 6pm - Pager - [phone]  After 6pm go to [email or web address] - Therapist, nutritional Hospitalists

## 2019-02-18 NOTE — Progress Notes (Signed)
Attempted to feed pt for breakfast and lunch and she would not eat. Pt is still confused and agitated.

## 2019-02-18 NOTE — Progress Notes (Signed)
Continues to tolerate extubation well without respiratory distress.  Oxygen requirements have increased some but she remains on nasal cannula oxygen.  She is moaning constantly and is very poorly oriented.  Vitals:   02/18/19 0500 02/18/19 0600 02/18/19 0735 02/18/19 0930  BP: 135/84 (!) 166/93  (!) 148/109  Pulse: 84 (!) 114 (!) 103 (!) 123  Resp: (!) 21 (!) 26 (!) 21 (!) 27  Temp:      TempSrc:      SpO2: 99% 92% 100% (!) 85%  Weight:      Height:      [place] 6 LPM  Frail, chronically ill-appearing Temporal wasting NCAT, sclerae white Edentulous Neck supple without jugular venous distention Minimal scattered wheezes, few rhonchi RRR, no M Abdomen soft, NT, NABS Extremities warm, no edema Cranial nerves intact, moves all extremities   BMP Latest Ref Rng & Units 02/18/2019 02/17/2019 02/16/2019  Glucose 70 - 99 mg/dL 80 373(S) 287(G)  BUN 6 - 20 mg/dL 81(L) 20 18  Creatinine 0.44 - 1.00 mg/dL 5.72(I) 2.03 5.59  Sodium 135 - 145 mmol/L 146(H) 142 142  Potassium 3.5 - 5.1 mmol/L 2.8(L) 2.7(LL) 3.0(L)  Chloride 98 - 111 mmol/L 111 107 109  CO2 22 - 32 mmol/L 29 28 27   Calcium 8.9 - 10.3 mg/dL 7.4(B) 8.3(L) 8.1(L)   CBC Latest Ref Rng & Units 02/18/2019 02/17/2019 02/16/2019  WBC 4.0 - 10.5 K/uL 4.5 8.9 7.7  Hemoglobin 12.0 - 15.0 g/dL 10.3(L) 11.4(L) 11.0(L)  Hematocrit 36.0 - 46.0 % 33.6(L) 36.1 35.2(L)  Platelets 150 - 400 K/uL 66(L) 88(L) 72(L)   Results for orders placed or performed during the hospital encounter of 02/15/19  Urine culture     Status: None   Collection Time: 02/15/19  7:20 AM  Result Value Ref Range Status   Specimen Description   Final    URINE, RANDOM Performed at [facility], [address], [place], [place] [address]    Special Requests   Final    NONE Performed at [facility], [address] [place], [place] [address]    Culture   Final    NO GROWTH Performed at [facility], [address] [place], [place] [address]     Report Status 02/16/2019 FINAL  Final  Blood Culture (routine x 2)     Status: Abnormal   Collection Time: 02/15/19  7:21 AM  Result Value Ref Range Status   Specimen Description   Final    BLOOD RIGHT ANTECUBITAL Performed at [facility], [address] [place], [place] [address]    Special Requests   Final    BOTTLES DRAWN AEROBIC AND ANAEROBIC Blood Culture results may not be optimal due to an excessive volume of blood received in culture bottles Performed at [facility], [address], [place], [place] [address]    Culture  Setup Time   Final    GRAM POSITIVE COCCI IN PAIRS AND CHAINS IN BOTH AEROBIC AND ANAEROBIC BOTTLES CRITICAL RESULT CALLED TO, READ BACK BY AND VERIFIED WITH: Javelle Donigan BESANTI 02/15/19 @ 2224  MLK Performed at [facility], [address] [place], [place] [address]    Culture STREPTOCOCCUS PNEUMONIAE (A)  Final   Report Status 02/18/2019 FINAL  Final   Organism ID, Bacteria STREPTOCOCCUS PNEUMONIAE  Final      Susceptibility   Streptococcus pneumoniae - MIC*    ERYTHROMYCIN >=8 RESISTANT Resistant     LEVOFLOXACIN 1 SENSITIVE Sensitive  VANCOMYCIN 0.5 SENSITIVE Sensitive     PENICILLIN (meningitis) 0.12 RESISTANT Resistant     PENICILLIN (non-meningitis) 0.12 SENSITIVE Sensitive     CEFTRIAXONE (non-meningitis) 0.25 SENSITIVE Sensitive     CEFTRIAXONE (meningitis) 0.25 SENSITIVE Sensitive     * STREPTOCOCCUS PNEUMONIAE  Blood Culture (routine x 2)     Status: Abnormal   Collection Time: 02/15/19  7:21 AM  Result Value Ref Range Status   Specimen Description   Final    BLOOD BLOOD RIGHT FOREARM Performed at [facility], [address] [place], [place] [address]    Special Requests   Final    BOTTLES DRAWN AEROBIC AND ANAEROBIC Blood Culture results may not be optimal due to an excessive volume of blood received in culture bottles Performed at [facility], [address] [place], [place]  [address]    Culture  Setup Time   Final    Organism ID to follow GRAM POSITIVE COCCI IN PAIRS AND CHAINS IN BOTH AEROBIC AND ANAEROBIC BOTTLES CRITICAL RESULT CALLED TO, READ BACK BY AND VERIFIED WITH: Tashawn Laswell BASANTI 02/15/19 @ 2224  MLK Performed at [facility], [address] [place], [place] [address]    Culture (A)  Final    STREPTOCOCCUS PNEUMONIAE SUSCEPTIBILITIES PERFORMED ON PREVIOUS CULTURE WITHIN THE LAST 5 DAYS. Performed at [facility], [address], [place], [place] [address]    Report Status 02/18/2019 FINAL  Final  Blood Culture ID Panel (Reflexed)     Status: Abnormal   Collection Time: 02/15/19  7:21 AM  Result Value Ref Range Status   Enterococcus species NOT DETECTED NOT DETECTED Final   Listeria monocytogenes NOT DETECTED NOT DETECTED Final   Staphylococcus species NOT DETECTED NOT DETECTED Final   Staphylococcus aureus (BCID) NOT DETECTED NOT DETECTED Final   Streptococcus species DETECTED (A) NOT DETECTED Final    Comment: CRITICAL RESULT CALLED TO, READ BACK BY AND VERIFIED WITH: Allyna Pittsley BESANTI 02/15/19 @ 2224  MLK    Streptococcus agalactiae NOT DETECTED NOT DETECTED Final   Streptococcus pneumoniae DETECTED (A) NOT DETECTED Final    Comment: CRITICAL RESULT CALLED TO, READ BACK BY AND VERIFIED WITH: Landy Dunnavant BESANTI 02/15/19 @ 2224  MLK    Streptococcus pyogenes NOT DETECTED NOT DETECTED Final   Acinetobacter baumannii NOT DETECTED NOT DETECTED Final   Enterobacteriaceae species NOT DETECTED NOT DETECTED Final   Enterobacter cloacae complex NOT DETECTED NOT DETECTED Final   Escherichia coli NOT DETECTED NOT DETECTED Final   Klebsiella oxytoca NOT DETECTED NOT DETECTED Final   Klebsiella pneumoniae NOT DETECTED NOT DETECTED Final   Proteus species NOT DETECTED NOT DETECTED Final   Serratia marcescens NOT DETECTED NOT DETECTED Final   Haemophilus influenzae NOT DETECTED NOT DETECTED Final   Neisseria meningitidis NOT DETECTED NOT DETECTED  Final   Pseudomonas aeruginosa NOT DETECTED NOT DETECTED Final   Candida albicans NOT DETECTED NOT DETECTED Final   Candida glabrata NOT DETECTED NOT DETECTED Final   Candida krusei NOT DETECTED NOT DETECTED Final   Candida parapsilosis NOT DETECTED NOT DETECTED Final   Candida tropicalis NOT DETECTED NOT DETECTED Final    Comment: Performed at [facility], [address] [place], [place] [address]  MRSA PCR Screening     Status: None   Collection Time: 02/15/19 10:24 AM  Result Value Ref Range Status   MRSA by PCR NEGATIVE NEGATIVE Final    Comment:        The GeneXpert MRSA Assay (  FDA approved for NASAL specimens only), is one component of a comprehensive MRSA colonization surveillance program. It is not intended to diagnose MRSA infection nor to guide or monitor treatment for MRSA infections. Performed at [facility], [address] [place], [place] [address]   Culture, respiratory (non-expectorated)     Status: None (Preliminary result)   Collection Time: 02/15/19 10:30 AM  Result Value Ref Range Status   Specimen Description   Final    TRACHEAL ASPIRATE Performed at [facility], [address], [place], [place] [address]    Special Requests   Final    NONE Performed at [facility], [address] [place], [place] [address]    Gram Stain   Final    ABUNDANT WBC PRESENT,BOTH PMN AND MONONUCLEAR FEW GRAM POSITIVE COCCI RARE GRAM POSITIVE RODS Performed at [facility], [address], [place], [place] [address]    Culture ABUNDANT STREPTOCOCCUS PNEUMONIAE  Final   Report Status PENDING  Incomplete   Anti-infectives (From admission, onward)   Start     Dose/Rate Route Frequency Ordered Stop   02/17/19 1000  cefTRIAXone (ROCEPHIN) 2 g in sodium chloride 0.9 % 100 mL IVPB     2 g 200 mL/hr over 30 Minutes Intravenous Every 24 hours 02/16/19 1011     02/16/19 0700  cefTRIAXone (ROCEPHIN) 2 g in sodium chloride 0.9 %  100 mL IVPB  Status:  Discontinued     2 g 200 mL/hr over 30 Minutes Intravenous Every 24 hours 02/16/19 0033 02/16/19 1011   02/15/19 2000  ceFEPIme (MAXIPIME) 2 g in sodium chloride 0.9 % 100 mL IVPB  Status:  Discontinued     2 g 200 mL/hr over 30 Minutes Intravenous Every 12 hours 02/15/19 1029 02/16/19 0033   02/15/19 1130  azithromycin (ZITHROMAX) 500 mg in sodium chloride 0.9 % 250 mL IVPB  Status:  Discontinued     500 mg 250 mL/hr over 60 Minutes Intravenous Every 24 hours 02/15/19 1023 02/16/19 0033   02/15/19 0700  vancomycin (VANCOCIN) 1,500 mg in sodium chloride 0.9 % 500 mL IVPB     1,500 mg 250 mL/hr over 120 Minutes Intravenous  Once 02/15/19 0658 02/15/19 1039   02/15/19 0645  vancomycin (VANCOCIN) IVPB 1000 mg/200 mL premix  Status:  Discontinued     1,000 mg 200 mL/hr over 60 Minutes Intravenous  Once 02/15/19 0644 02/15/19 0658   02/15/19 0645  ceFEPIme (MAXIPIME) 2 g in sodium chloride 0.9 % 100 mL IVPB     2 g 200 mL/hr over 30 Minutes Intravenous  Once 02/15/19 0644 02/15/19 0806     CXR: NSC extensive left-sided infiltrate  IMPRESSION: Pneumococcal pneumonia with bacteremia Severe sepsis with septic shock, resolved Acute hypoxemic respiratory failure VDRF, resolved AKI, resolved Hyperkalemia, resolved Hypokalemia, recurrent Mild hypernatremia Thrombocytopenia, slightly worse History of polysubstance abuse including opiate addiction  UDS positive for multiple substances on admission Severe acute encephalopathy/TME Severe protein-calorie malnutrition  PLAN/REC: Continue to monitor in SDU today Continue supplemental oxygen as needed to maintain SPO2 >90% Monitor BMET intermittently Monitor I/Os Correct electrolytes as indicated D5W + KCl initiated 3/13 Monitor temp, WBC count Micro and abx as above Complete 7 days ceftriaxone DVT px: SCDs.  Enoxaparin discontinued 3/13 Monitor CBC intermittently Transfuse per usual guidelines Continue Suboxone at  current dose Dexmedetomidine initiated 3/13 Advance activity and nutrition as able  Husband updated in waiting room  Billy Fischer, MD PCCM service Mobile [phone] Pager [phone]  02/18/2019 11:57 AM

## 2019-02-18 NOTE — Progress Notes (Signed)
SLP Cancellation Note  Patient Details Name: Kylie [place] MRN: [id number] DOB: [birth year]   Cancelled treatment:       Reason Eval/Treat Not Completed: Patient's level of consciousness;Fatigue/lethargy limiting ability to participate(chart reviewed; pt moaning loudly in room). Pt is not appropriate for trials to upgrade diet w/ current presentation. ST services will f/u next 2-3 days as pt's status improves. Discussed ongoing supervision and aspiration precautions w/ any oral intake; pt remains on a Dysphagia level 1 w/ Nectar liquids d/t risk for aspiration. NSG agreed.     Jerilynn Som, MS, CCC-SLP Watson,Katherine 02/18/2019, 2:51 PM

## 2019-02-19 ENCOUNTER — Inpatient Hospital Stay: Payer: Medicaid Other

## 2019-02-19 LAB — BASIC METABOLIC PANEL
Anion gap: 7 (ref 5–15)
BUN: 19 mg/dL (ref 6–20)
CO2: 31 mmol/L (ref 22–32)
Calcium: 8.3 mg/dL — ABNORMAL LOW (ref 8.9–10.3)
Chloride: 107 mmol/L (ref 98–111)
Creatinine, Ser: 0.48 mg/dL (ref 0.44–1.00)
GFR calc Af Amer: >60 mL/min (ref >60)
GFR calc non Af Amer: >60 mL/min (ref >60)
GLUCOSE: 109 mg/dL — AB (ref 70–99)
Potassium: 3.7 mmol/L (ref 3.5–5.1)
Sodium: 145 mmol/L (ref 135–145)

## 2019-02-19 LAB — CBC
HCT: 32.4 % — ABNORMAL LOW (ref 36.0–46.0)
Hemoglobin: 10.1 g/dL — ABNORMAL LOW (ref 12.0–15.0)
MCH: 29.4 pg (ref 26.0–34.0)
MCHC: 31.2 g/dL (ref 30.0–36.0)
MCV: 94.5 fL (ref 80.0–100.0)
Platelets: 74 10*3/uL — ABNORMAL LOW (ref 150–400)
RBC: 3.43 MIL/uL — ABNORMAL LOW (ref 3.87–5.11)
RDW: 17.3 % — ABNORMAL HIGH (ref 11.5–15.5)
WBC: 4.9 10*3/uL (ref 4.0–10.5)
nRBC: 0.4 % — ABNORMAL HIGH (ref 0.0–0.2)

## 2019-02-19 LAB — MAGNESIUM: Magnesium: 1.6 mg/dL — ABNORMAL LOW (ref 1.7–2.4)

## 2019-02-19 LAB — PROCALCITONIN
Procalcitonin: 1.11 ng/mL
Procalcitonin: 1.01 ng/mL

## 2019-02-19 LAB — PHOSPHORUS: Phosphorus: 2.1 mg/dL — ABNORMAL LOW (ref 2.5–4.6)

## 2019-02-19 MED ORDER — LORAZEPAM 2 MG/ML IJ SOLN
0.5000 mg | INTRAMUSCULAR | Status: DC | PRN
  Administered 2019-02-19 – 2019-02-21 (×8): 0.5 mg via INTRAVENOUS
  Filled 2019-02-19 (×8): qty 1

## 2019-02-19 MED ORDER — IPRATROPIUM-ALBUTEROL 0.5-2.5 (3) MG/3ML IN SOLN
3.0000 mL | RESPIRATORY_TRACT | Status: DC | PRN
  Administered 2019-02-19 – 2019-02-21 (×4): 3 mL via RESPIRATORY_TRACT
  Filled 2019-02-19 (×4): qty 3

## 2019-02-19 MED ORDER — BUPRENORPHINE HCL-NALOXONE HCL 2-0.5 MG SL SUBL
1.0000 | SUBLINGUAL_TABLET | Freq: Every day | SUBLINGUAL | Status: DC
  Administered 2019-02-19 – 2019-02-23 (×5): 1 via SUBLINGUAL
  Filled 2019-02-19 (×5): qty 1

## 2019-02-19 NOTE — Progress Notes (Signed)
[employer] - [place] [facility]     PATIENT NAME: Kylie [place]    MR#:  [id number]  DATE OF BIRTH:  June 17, 1956  SUBJECTIVE:   No acute events overnight, remains lethargic/encephalopathic on a Precedex drip.  REVIEW OF SYSTEMS:    Review of Systems  Unable to perform ROS: Mental acuity    Nutrition: Dysphagia 1 with nectar thick liquids Tolerating Diet: Little Tolerating PT: await eval     DRUG ALLERGIES:  No Known Allergies  VITALS:  Blood pressure 93/63, pulse 71, temperature (!) 97.3 F (36.3 C), temperature source Axillary, resp. rate (!) 27, height 5' (1.524 m), weight 60.4 kg, SpO2 92 %.  PHYSICAL EXAMINATION:   Physical Exam  GENERAL:  63 y.o.-year-old patient lying in bed lethargic/encephalopathic.  EYES: Pupils equal, round, reactive to light. No scleral icterus. Extraocular muscles intact.  HEENT: Head atraumatic, normocephalic. Dry Oral Mucosa.  NECK:  Supple, no jugular venous distention. No thyroid enlargement, no tenderness.  LUNGS: Poor Resp. Effort,  no wheezing, rales, rhonchi. No use of accessory muscles of respiration.  CARDIOVASCULAR: S1, S2 normal. No murmurs, rubs, or gallops.  ABDOMEN: Soft, nontender, nondistended. Bowel sounds present. No organomegaly or mass.  EXTREMITIES: No cyanosis, clubbing or edema b/l.    NEUROLOGIC: Lethargic/Encephalopathic PSYCHIATRIC: Lethargic/encephalpathic.  SKIN: No obvious rash, lesion, or ulcer.    LABORATORY PANEL:   CBC Recent Labs  Lab 02/19/19 0356  WBC 4.9  HGB 10.1*  HCT 32.4*  PLT 74*   ------------------------------------------------------------------------------------------------------------------  Chemistries  Recent Labs  Lab 02/16/19 0532  02/19/19 0356  NA 135   < > 145  K 5.8*   < > 3.7  CL 104   < > 107  CO2 23   < > 31  GLUCOSE 135*   < > 109*  BUN 18   < > 19  CREATININE 0.57   < > 0.48  CALCIUM 7.6*   < > 8.3*  MG  --   --  1.6*  AST 71*  --   --    ALT 33  --   --   ALKPHOS 53  --   --   BILITOT 2.9*  --   --    < > = values in this interval not displayed.   ------------------------------------------------------------------------------------------------------------------  Cardiac Enzymes Recent Labs  Lab 02/15/19 0631  TROPONINI <0.03   ------------------------------------------------------------------------------------------------------------------  RADIOLOGY:  Dg Chest Port 1 View  Result Date: 02/19/2019 CLINICAL DATA:  Respiratory failure EXAM: PORTABLE CHEST 1 VIEW COMPARISON:  02/18/2019 FINDINGS: Multifocal patchy left lung opacities. Right lung is predominantly clear. Overall appearance is unchanged. No pleural effusion or pneumothorax. The heart is normal in size. IMPRESSION: Multifocal patchy left lung opacities, compatible with pneumonia, grossly unchanged. Electronically Signed   By: Charline Bills M.D.   On: 02/19/2019 05:04   Dg Chest Port 1 View  Result Date: 02/18/2019 CLINICAL DATA:  Acute respiratory failure EXAM: PORTABLE CHEST 1 VIEW COMPARISON:  Yesterday FINDINGS: Cardiomegaly and asymmetric extensive airspace disease that is diffusely confluent on the left. Hilar prominence which is likely vascular. No visible effusion. No pneumothorax IMPRESSION: Extensive pneumonia without interval progression. Electronically Signed   By: Marnee Spring M.D.   On: 02/18/2019 06:15     ASSESSMENT AND PLAN:   63 year old female with past medical history of substance abuse, hypertension, hepatitis C, COPD with ongoing tobacco abuse who presented to the hospital due to altered mental status and hypoxia.  1. Altered mental status-  suspected to be metabolic encephalopathy secondary to underlying sepsis. -Patient is extubated now and remains encephalopathic on a Precedex drip.  2. Sepsis due to pneumonia and bacteremia- this is secondary to strep pneumonia. -Patient sputum cultures and blood cultures are positive  for strep pneumo.  Continue IV ceftriaxone for now.    3. Acute respiratory failure with hypoxia- secondary to pneumonia. -Continue O2 supplementation, patient was initially intubated but now extubated. - Continue IV ceftriaxone for the pneumococcal pneumonia.  Continue Pulmicort nebs, duo nebs as needed.  Continue aggressive pulmonary toileting.  4. COPD-acute exacerbation secondary to pneumonia. -Improved.  Off IV steroids.  Continue duo nebs as needed, Pulmicort nebs.  Appreciate pulmonary input.  5. Thrombocytopenia-secondary to patient's history of hepatitis C.  -Counts stable.  No acute bleeding.  We will continue to monitor.  6.  Hyperkalemia-improved and resolved now.  7. Substance abuse including cocaine abuse - cont. Suboxone.      All the records are reviewed and case discussed with Care Management/Social Worker. Management plans discussed with the patient, family and they are in agreement.  CODE STATUS: Full code  DVT Prophylaxis: Ted's & SCD's.   TOTAL TIME TAKING CARE OF THIS PATIENT: 30 minutes.   POSSIBLE D/C unclear DAYS, DEPENDING ON CLINICAL CONDITION and progress.    Houston Siren M.D on 02/19/2019 at 2:15 PM  Between 7am to 6pm - Pager - [phone]  After 6pm go to [email or web address] - Scientist, research (life sciences) [facility]  Office  [phone]  CC: Primary care physician; Donaciano Eva, FNP

## 2019-02-19 NOTE — Progress Notes (Signed)
Continues to moan almost incessantly.  She will follow commands and is eating some with assistance.  No overt respiratory distress.  Remains on dexmedetomidine infusion   Vitals:   02/19/19 0740 02/19/19 0800 02/19/19 0900 02/19/19 1000  BP:  (!) 96/53 96/77 (!) 83/53  Pulse:  83 91 75  Resp:  (!) 26 (!) 36 (!) 23  Temp:  (!) 97.3 F (36.3 C)    TempSrc:  Axillary    SpO2: 95% 95% (!) 89% 97%  Weight:      Height:      Olimpo 3 LPM  Very frail-appearing, no overt distress NCAT, sclerae white, temporal wasting, edentulous Neck supple without lymphadenopathy or JVD Few scattered rhonchi, no wheezes RRR, no M Abdomen soft, NT, + BS Extremities warm without edema Cranial nerves intact, no focal neurologic deficits    BMP Latest Ref Rng & Units 02/19/2019 02/18/2019 02/17/2019  Glucose 70 - 99 mg/dL 161(W109(H) 80 960(A128(H)  BUN 6 - 20 mg/dL 19 54(U24(H) 20  Creatinine 0.44 - 1.00 mg/dL 9.810.48 1.91(Y0.39(L) 7.820.55  Sodium 135 - 145 mmol/L 145 146(H) 142  Potassium 3.5 - 5.1 mmol/L 3.7 2.8(L) 2.7(LL)  Chloride 98 - 111 mmol/L 107 111 107  CO2 22 - 32 mmol/L 31 29 28   Calcium 8.9 - 10.3 mg/dL 8.3(L) 8.8(L) 8.3(L)   CBC Latest Ref Rng & Units 02/19/2019 02/18/2019 02/17/2019  WBC 4.0 - 10.5 K/uL 4.9 4.5 8.9  Hemoglobin 12.0 - 15.0 g/dL 10.1(L) 10.3(L) 11.4(L)  Hematocrit 36.0 - 46.0 % 32.4(L) 33.6(L) 36.1  Platelets 150 - 400 K/uL 74(L) 66(L) 88(L)   Results for orders placed or performed during the hospital encounter of 02/15/19  Urine culture     Status: None   Collection Time: 02/15/19  7:20 AM  Result Value Ref Range Status   Specimen Description   Final    URINE, RANDOM Performed at [facility], [address] [place], [place] [address]    Special Requests   Final    NONE Performed at [facility], [address] [place], [place] [address]    Culture   Final    NO GROWTH Performed at [facility], [address] [place], [place] [address]    Report Status  02/16/2019 FINAL  Final  Blood Culture (routine x 2)     Status: Abnormal   Collection Time: 02/15/19  7:21 AM  Result Value Ref Range Status   Specimen Description   Final    BLOOD RIGHT ANTECUBITAL Performed at [facility], [address] [place], [place] [address]    Special Requests   Final    BOTTLES DRAWN AEROBIC AND ANAEROBIC Blood Culture results may not be optimal due to an excessive volume of blood received in culture bottles Performed at [facility], [address] [place], [place] [address]    Culture  Setup Time   Final    GRAM POSITIVE COCCI IN PAIRS AND CHAINS IN BOTH AEROBIC AND ANAEROBIC BOTTLES CRITICAL RESULT CALLED TO, READ BACK BY AND VERIFIED WITH: DAVID BESANTI 02/15/19 @ 2224  MLK Performed at [facility], [address] [place], [place] [address]    Culture STREPTOCOCCUS PNEUMONIAE (A)  Final   Report Status 02/18/2019 FINAL  Final   Organism ID, Bacteria STREPTOCOCCUS PNEUMONIAE  Final      Susceptibility   Streptococcus pneumoniae - MIC*    ERYTHROMYCIN >=8 RESISTANT Resistant     LEVOFLOXACIN 1 SENSITIVE Sensitive  VANCOMYCIN 0.5 SENSITIVE Sensitive     PENICILLIN (meningitis) 0.12 RESISTANT Resistant     PENICILLIN (non-meningitis) 0.12 SENSITIVE Sensitive     CEFTRIAXONE (non-meningitis) 0.25 SENSITIVE Sensitive     CEFTRIAXONE (meningitis) 0.25 SENSITIVE Sensitive     * STREPTOCOCCUS PNEUMONIAE  Blood Culture (routine x 2)     Status: Abnormal   Collection Time: 02/15/19  7:21 AM  Result Value Ref Range Status   Specimen Description   Final    BLOOD BLOOD RIGHT FOREARM Performed at [facility], [address] [place], [place] [address]    Special Requests   Final    BOTTLES DRAWN AEROBIC AND ANAEROBIC Blood Culture results may not be optimal due to an excessive volume of blood received in culture bottles Performed at [facility], [address] [place], [place] [address]    Culture   Setup Time   Final    Organism ID to follow GRAM POSITIVE COCCI IN PAIRS AND CHAINS IN BOTH AEROBIC AND ANAEROBIC BOTTLES CRITICAL RESULT CALLED TO, READ BACK BY AND VERIFIED WITH: DAVID BASANTI 02/15/19 @ 2224  MLK Performed at [facility], [address] [place], [place] [address]    Culture (A)  Final    STREPTOCOCCUS PNEUMONIAE SUSCEPTIBILITIES PERFORMED ON PREVIOUS CULTURE WITHIN THE LAST 5 DAYS. Performed at [facility], [address], [place], [place] [address]    Report Status 02/18/2019 FINAL  Final  Blood Culture ID Panel (Reflexed)     Status: Abnormal   Collection Time: 02/15/19  7:21 AM  Result Value Ref Range Status   Enterococcus species NOT DETECTED NOT DETECTED Final   Listeria monocytogenes NOT DETECTED NOT DETECTED Final   Staphylococcus species NOT DETECTED NOT DETECTED Final   Staphylococcus aureus (BCID) NOT DETECTED NOT DETECTED Final   Streptococcus species DETECTED (A) NOT DETECTED Final    Comment: CRITICAL RESULT CALLED TO, READ BACK BY AND VERIFIED WITH: DAVID BESANTI 02/15/19 @ 2224  MLK    Streptococcus agalactiae NOT DETECTED NOT DETECTED Final   Streptococcus pneumoniae DETECTED (A) NOT DETECTED Final    Comment: CRITICAL RESULT CALLED TO, READ BACK BY AND VERIFIED WITH: DAVID BESANTI 02/15/19 @ 2224  MLK    Streptococcus pyogenes NOT DETECTED NOT DETECTED Final   Acinetobacter baumannii NOT DETECTED NOT DETECTED Final   Enterobacteriaceae species NOT DETECTED NOT DETECTED Final   Enterobacter cloacae complex NOT DETECTED NOT DETECTED Final   Escherichia coli NOT DETECTED NOT DETECTED Final   Klebsiella oxytoca NOT DETECTED NOT DETECTED Final   Klebsiella pneumoniae NOT DETECTED NOT DETECTED Final   Proteus species NOT DETECTED NOT DETECTED Final   Serratia marcescens NOT DETECTED NOT DETECTED Final   Haemophilus influenzae NOT DETECTED NOT DETECTED Final   Neisseria meningitidis NOT DETECTED NOT DETECTED Final   Pseudomonas  aeruginosa NOT DETECTED NOT DETECTED Final   Candida albicans NOT DETECTED NOT DETECTED Final   Candida glabrata NOT DETECTED NOT DETECTED Final   Candida krusei NOT DETECTED NOT DETECTED Final   Candida parapsilosis NOT DETECTED NOT DETECTED Final   Candida tropicalis NOT DETECTED NOT DETECTED Final    Comment: Performed at [facility], [address] [place], [place] [address]  MRSA PCR Screening     Status: None   Collection Time: 02/15/19 10:24 AM  Result Value Ref Range Status   MRSA by PCR NEGATIVE NEGATIVE Final    Comment:        The GeneXpert MRSA Assay (  FDA approved for NASAL specimens only), is one component of a comprehensive MRSA colonization surveillance program. It is not intended to diagnose MRSA infection nor to guide or monitor treatment for MRSA infections. Performed at [facility], [address] [place], [place] [address]   Culture, respiratory (non-expectorated)     Status: None   Collection Time: 02/15/19 10:30 AM  Result Value Ref Range Status   Specimen Description   Final    TRACHEAL ASPIRATE Performed at [facility], [address], [place], [place] [address]    Special Requests   Final    NONE Performed at [facility], [address] [place], [place] [address]    Gram Stain   Final    ABUNDANT WBC PRESENT,BOTH PMN AND MONONUCLEAR FEW GRAM POSITIVE COCCI RARE GRAM POSITIVE RODS Performed at [facility], [address], [place], [place] [address]    Culture ABUNDANT STREPTOCOCCUS PNEUMONIAE  Final   Report Status 02/18/2019 FINAL  Final   Organism ID, Bacteria STREPTOCOCCUS PNEUMONIAE  Final      Susceptibility   Streptococcus pneumoniae - MIC*    ERYTHROMYCIN >=8 RESISTANT Resistant     LEVOFLOXACIN 1 SENSITIVE Sensitive     VANCOMYCIN 0.5 SENSITIVE Sensitive     PENO - penicillin 0.12      PENICILLIN (oral) 0.12 INTERMEDIATE Intermediate     CEFTRIAXONE (non-meningitis) 0.25 SENSITIVE  Sensitive     * ABUNDANT STREPTOCOCCUS PNEUMONIAE   Anti-infectives (From admission, onward)   Start     Dose/Rate Route Frequency Ordered Stop   02/17/19 1000  cefTRIAXone (ROCEPHIN) 2 g in sodium chloride 0.9 % 100 mL IVPB     2 g 200 mL/hr over 30 Minutes Intravenous Every 24 hours 02/16/19 1011     02/16/19 0700  cefTRIAXone (ROCEPHIN) 2 g in sodium chloride 0.9 % 100 mL IVPB  Status:  Discontinued     2 g 200 mL/hr over 30 Minutes Intravenous Every 24 hours 02/16/19 0033 02/16/19 1011   02/15/19 2000  ceFEPIme (MAXIPIME) 2 g in sodium chloride 0.9 % 100 mL IVPB  Status:  Discontinued     2 g 200 mL/hr over 30 Minutes Intravenous Every 12 hours 02/15/19 1029 02/16/19 0033   02/15/19 1130  azithromycin (ZITHROMAX) 500 mg in sodium chloride 0.9 % 250 mL IVPB  Status:  Discontinued     500 mg 250 mL/hr over 60 Minutes Intravenous Every 24 hours 02/15/19 1023 02/16/19 0033   02/15/19 0700  vancomycin (VANCOCIN) 1,500 mg in sodium chloride 0.9 % 500 mL IVPB     1,500 mg 250 mL/hr over 120 Minutes Intravenous  Once 02/15/19 0658 02/15/19 1039   02/15/19 0645  vancomycin (VANCOCIN) IVPB 1000 mg/200 mL premix  Status:  Discontinued     1,000 mg 200 mL/hr over 60 Minutes Intravenous  Once 02/15/19 0644 02/15/19 0658   02/15/19 0645  ceFEPIme (MAXIPIME) 2 g in sodium chloride 0.9 % 100 mL IVPB     2 g 200 mL/hr over 30 Minutes Intravenous  Once 02/15/19 0644 02/15/19 0806     CXR: No significant change extensive L sided infiltrate  IMPRESSION: Pneumococcal pneumonia with bacteremia Severe sepsis with septic shock, resolved Acute hypoxemic respiratory failure VDRF, resolved AKI, resolved Hyperkalemia, resolved Hypokalemia, resolved Mild hypernatremia Mild thrombocytopenia History of polysubstance abuse including opiate addiction UDS positive for multiple substances on admission Severe acute encephalopathy/TME Severe protein-calorie malnutrition  PLAN/REC: Continue to monitor in  SDU as long  as she is requiring dexmedetomidine Continue supplemental oxygen as needed to maintain SPO2 >90% Monitor BMET intermittently Monitor I/Os Correct electrolytes as indicated Continue D5W + KCl initiated 3/13 Monitor temp, WBC count Micro and abx as above Complete 7 days ceftriaxone (through 3/16) DVT px: SCDs.  Enoxaparin discontinued 3/13 Monitor CBC intermittently Transfuse per usual guidelines Continue Suboxone.  Dose decreased to 3/14 Continue dexmedetomidine initiated 3/13 Advance activity and nutrition as able   Billy Fischer, MD PCCM service Mobile [phone] Pager [phone] 02/19/2019 11:08 AM

## 2019-02-20 DIAGNOSIS — R410 Disorientation, unspecified: Secondary | ICD-10-CM

## 2019-02-20 DIAGNOSIS — R451 Restlessness and agitation: Secondary | ICD-10-CM

## 2019-02-20 LAB — CBC
HCT: 32.4 % — ABNORMAL LOW (ref 36.0–46.0)
Hemoglobin: 10.1 g/dL — ABNORMAL LOW (ref 12.0–15.0)
MCH: 29.7 pg (ref 26.0–34.0)
MCHC: 31.2 g/dL (ref 30.0–36.0)
MCV: 95.3 fL (ref 80.0–100.0)
Platelets: 84 10*3/uL — ABNORMAL LOW (ref 150–400)
RBC: 3.4 MIL/uL — ABNORMAL LOW (ref 3.87–5.11)
RDW: 17.6 % — ABNORMAL HIGH (ref 11.5–15.5)
WBC: 6 10*3/uL (ref 4.0–10.5)
nRBC: 0 % (ref 0.0–0.2)

## 2019-02-20 LAB — AMMONIA: Ammonia: 55 umol/L — ABNORMAL HIGH (ref 9–35)

## 2019-02-20 LAB — COMPREHENSIVE METABOLIC PANEL
ALBUMIN: 2.2 g/dL — AB (ref 3.5–5.0)
ALT: 16 U/L (ref 0–44)
ANION GAP: 5 (ref 5–15)
AST: 29 U/L (ref 15–41)
Alkaline Phosphatase: 63 U/L (ref 38–126)
BILIRUBIN TOTAL: 0.9 mg/dL (ref 0.3–1.2)
BUN: 12 mg/dL (ref 6–20)
CO2: 33 mmol/L — ABNORMAL HIGH (ref 22–32)
Calcium: 7.8 mg/dL — ABNORMAL LOW (ref 8.9–10.3)
Chloride: 103 mmol/L (ref 98–111)
Creatinine, Ser: 0.4 mg/dL — ABNORMAL LOW (ref 0.44–1.00)
GFR calc Af Amer: >60 mL/min (ref >60)
GFR calc non Af Amer: >60 mL/min (ref >60)
GLUCOSE: 119 mg/dL — AB (ref 70–99)
Potassium: 3.7 mmol/L (ref 3.5–5.1)
Sodium: 141 mmol/L (ref 135–145)
TOTAL PROTEIN: 5.8 g/dL — AB (ref 6.5–8.1)

## 2019-02-20 LAB — PROCALCITONIN: Procalcitonin: 0.45 ng/mL

## 2019-02-20 MED ORDER — LACTATED RINGERS IV SOLN
INTRAVENOUS | Status: DC
  Administered 2019-02-20: 10:00:00 via INTRAVENOUS
  Administered 2019-02-21: 50 mL/h via INTRAVENOUS
  Administered 2019-02-22: 21:00:00 via INTRAVENOUS
  Administered 2019-02-22: 50 mL/h via INTRAVENOUS
  Administered 2019-02-23 (×2): via INTRAVENOUS

## 2019-02-20 MED ORDER — METOPROLOL TARTRATE 5 MG/5ML IV SOLN
2.5000 mg | INTRAVENOUS | Status: DC | PRN
  Administered 2019-02-20: 2.5 mg via INTRAVENOUS
  Filled 2019-02-20: qty 5

## 2019-02-20 MED ORDER — HYDRALAZINE HCL 20 MG/ML IJ SOLN: 10.0000 mg | INTRAMUSCULAR | Status: DC | PRN

## 2019-02-20 NOTE — Progress Notes (Signed)
[employer] - [place] [facility]     PATIENT NAME: Kylie Monroe    MR#:  [id number]  DATE OF BIRTH:  [birth year]  SUBJECTIVE:   Patient weaned off the Precedex drip and is a bit more awake but still confused and encephalopathic.  Patient is moaning and complaining of pain nonspecifically.  REVIEW OF SYSTEMS:    Review of Systems  Unable to perform ROS: Mental acuity    Nutrition: Dysphagia 1 with nectar thick liquids Tolerating Diet: Little Tolerating PT: await eval   DRUG ALLERGIES:  No Known Allergies  VITALS:  Blood pressure (!) 146/81, pulse 96, temperature 98.3 F (36.8 C), temperature source Oral, resp. rate (!) 41, height 5' (1.524 m), weight 60.4 kg, SpO2 (!) 88 %.  PHYSICAL EXAMINATION:   Physical Exam  GENERAL:  63 y.o.-year-old patient lying in bed lethargic/encephalopathic.  EYES: Pupils equal, round, reactive to light. No scleral icterus. Extraocular muscles intact.  HEENT: Head atraumatic, normocephalic. Dry Oral Mucosa.  NECK:  Supple, no jugular venous distention. No thyroid enlargement, no tenderness.  LUNGS: Poor Resp. Effort,  no wheezing, rales, rhonchi. No use of accessory muscles of respiration.  CARDIOVASCULAR: S1, S2 normal. No murmurs, rubs, or gallops.  ABDOMEN: Soft, nontender, nondistended. Bowel sounds present. No organomegaly or mass.  EXTREMITIES: No cyanosis, clubbing or edema b/l.    NEUROLOGIC: Nerves II through XII intact, no focal motor or sensory deficits appreciated bilaterally, globally weak. PSYCHIATRIC: Awake oriented x1, encephalopathic. SKIN: No obvious rash, lesion, or ulcer.    LABORATORY PANEL:   CBC Recent Labs  Lab 02/20/19 0529  WBC 6.0  HGB 10.1*  HCT 32.4*  PLT 84*   ------------------------------------------------------------------------------------------------------------------  Chemistries  Recent Labs  Lab 02/19/19 0356 02/20/19 0529  NA 145 141  K 3.7 3.7  CL 107 103  CO2 31  33*  GLUCOSE 109* 119*  BUN 19 12  CREATININE 0.48 0.40*  CALCIUM 8.3* 7.8*  MG 1.6*  --   AST  --  29  ALT  --  16  ALKPHOS  --  63  BILITOT  --  0.9   ------------------------------------------------------------------------------------------------------------------  Cardiac Enzymes Recent Labs  Lab 02/15/19 0631  TROPONINI <0.03   ------------------------------------------------------------------------------------------------------------------  RADIOLOGY:  Dg Chest Port 1 View  Result Date: 02/19/2019 CLINICAL DATA:  Respiratory failure EXAM: PORTABLE CHEST 1 VIEW COMPARISON:  02/18/2019 FINDINGS: Multifocal patchy left lung opacities. Right lung is predominantly clear. Overall appearance is unchanged. No pleural effusion or pneumothorax. The heart is normal in size. IMPRESSION: Multifocal patchy left lung opacities, compatible with pneumonia, grossly unchanged. Electronically Signed   By: Charline Bills M.D.   On: 02/19/2019 05:04     ASSESSMENT AND PLAN:   63 year old female with past medical history of substance abuse, hypertension, hepatitis C, COPD with ongoing tobacco abuse who presented to the hospital due to altered mental status and hypoxia.  1. Altered mental status- suspected to be metabolic encephalopathy secondary to underlying sepsis. -Improving, off Precedex drip and will continue to monitor.  Now most likely this is all ICU delirium.  2. Sepsis due to pneumonia and bacteremia- this is secondary to strep pneumonia. -Patient sputum cultures and blood cultures are positive for strep pneumo.  Continue IV ceftriaxone for now.    3. Acute respiratory failure with hypoxia- secondary to pneumonia. Patient much improved, patient extubated.  Continue O2 supplementation.  Continue IV antibiotics for pneumococcal pneumonia as mentioned above.  Continue Pulmicort nebs, duo nebs as needed.  4. COPD-acute exacerbation secondary to pneumonia. -Improved.  Off IV  steroids.  Continue duo nebs as needed, Pulmicort nebs.  Appreciate pulmonary input.  5. Thrombocytopenia-secondary to patient's history of hepatitis C.  -Counts stable.  No acute bleeding.  We will continue to monitor.  6.  Hyperkalemia-improved and resolved now.  7. Substance abuse including cocaine abuse - cont. Suboxone.      All the records are reviewed and case discussed with Care Management/Social Worker. Management plans discussed with the patient, family and they are in agreement.  CODE STATUS: Full code  DVT Prophylaxis: Ted's & SCD's.   TOTAL TIME TAKING CARE OF THIS PATIENT: 25 minutes.   POSSIBLE D/C unclear DAYS, DEPENDING ON CLINICAL CONDITION and progress.    Houston Siren M.D on 02/20/2019 at 12:56 PM  Between 7am to 6pm - Pager - [phone]  After 6pm go to [email or web address] - Scientist, research (life sciences) [facility]  Office  [phone]  CC: Primary care physician; Donaciano Eva, FNP

## 2019-02-20 NOTE — Progress Notes (Signed)
PT PROFILE: 76 F with hx of polysubstance abuse including opioid use disorder, COPD, hepatitis C intubated in ED 3/10 for acute respiratory failure with extensive left-sided pulmonary infiltrate.  Blood cultures positive for pneumococcus.  Extubated 3/11 but extreme agitation for several days post extubation  SUBJ: Remains poorly oriented but less agitated and more conversant (and less profane). No respiratory distress   Vitals:   02/20/19 0808 02/20/19 0900 02/20/19 1000 02/20/19 1100  BP:  98/64 97/65 (!) 146/81  Pulse: 72 69 71 96  Resp: (!) 31 (!) 25 (!) 37 (!) 41  Temp:      TempSrc:      SpO2: 94% 93% 96% (!) 88%  Weight:      Height:      [place] 3 LPM  Very frail-appearing, no overt respiratory distress, poorly oriented NCAT, sclerae white, temporal wasting, edentulous Neck supple without lymphadenopathy or JVD Chest clear anteriorly without adventitious sounds RRR, no M Abdomen soft, NT, + BS Extremities warm without cyanosis or edema Cranial nerves intact, moves all extremities    BMP Latest Ref Rng & Units 02/20/2019 02/19/2019 02/18/2019  Glucose 70 - 99 mg/dL 076(K) 088(P) 80  BUN 6 - 20 mg/dL 12 19 10(R)  Creatinine 0.44 - 1.00 mg/dL 1.59(Y) 5.85 9.29(W)  Sodium 135 - 145 mmol/L 141 145 146(H)  Potassium 3.5 - 5.1 mmol/L 3.7 3.7 2.8(L)  Chloride 98 - 111 mmol/L 103 107 111  CO2 22 - 32 mmol/L 33(H) 31 29  Calcium 8.9 - 10.3 mg/dL 7.8(L) 8.3(L) 8.8(L)   CBC Latest Ref Rng & Units 02/20/2019 02/19/2019 02/18/2019  WBC 4.0 - 10.5 K/uL 6.0 4.9 4.5  Hemoglobin 12.0 - 15.0 g/dL 10.1(L) 10.1(L) 10.3(L)  Hematocrit 36.0 - 46.0 % 32.4(L) 32.4(L) 33.6(L)  Platelets 150 - 400 K/uL 84(L) 74(L) 66(L)   Results for orders placed or performed during the hospital encounter of 02/15/19  Urine culture     Status: None   Collection Time: 02/15/19  7:20 AM  Result Value Ref Range Status   Specimen Description   Final    URINE, RANDOM Performed at [facility], [address] [place], [place] [address]    Special Requests   Final    NONE Performed at [facility], [address] [place], [place] [address]    Culture   Final    NO GROWTH Performed at [facility], [address] [place], [place] [address]    Report Status 02/16/2019 FINAL  Final  Blood Culture (routine x 2)     Status: Abnormal   Collection Time: 02/15/19  7:21 AM  Result Value Ref Range Status   Specimen Description   Final    BLOOD RIGHT ANTECUBITAL Performed at [facility], [address] [place], [place] [address]    Special Requests   Final    BOTTLES DRAWN AEROBIC AND ANAEROBIC Blood Culture results may not be optimal due to an excessive volume of blood received in culture bottles Performed at [facility], [address], [place], [place] [address]    Culture  Setup Time   Final    GRAM POSITIVE COCCI IN PAIRS AND CHAINS IN BOTH AEROBIC AND ANAEROBIC BOTTLES CRITICAL RESULT CALLED TO, READ BACK BY AND VERIFIED WITH: Naiah Donahoe BESANTI 02/15/19 @ 2224  MLK Performed at [facility], [address], [place], [place] [address]    Culture STREPTOCOCCUS PNEUMONIAE (A)  Final   Report Status 02/18/2019 FINAL  Final   Organism ID, Bacteria STREPTOCOCCUS PNEUMONIAE  Final      Susceptibility   Streptococcus pneumoniae - MIC*    ERYTHROMYCIN >=8 RESISTANT Resistant     LEVOFLOXACIN 1 SENSITIVE Sensitive     VANCOMYCIN 0.5 SENSITIVE Sensitive     PENICILLIN (meningitis) 0.12 RESISTANT Resistant     PENICILLIN (non-meningitis) 0.12 SENSITIVE Sensitive     CEFTRIAXONE (non-meningitis) 0.25 SENSITIVE Sensitive     CEFTRIAXONE (meningitis) 0.25 SENSITIVE Sensitive     * STREPTOCOCCUS PNEUMONIAE  Blood Culture (routine x 2)     Status: Abnormal   Collection Time: 02/15/19  7:21 AM  Result Value Ref Range Status   Specimen Description   Final    BLOOD BLOOD RIGHT FOREARM Performed at [facility], [address], [place],  [place] [address]    Special Requests   Final    BOTTLES DRAWN AEROBIC AND ANAEROBIC Blood Culture results may not be optimal due to an excessive volume of blood received in culture bottles Performed at [facility], [address] [place], [place] [address]    Culture  Setup Time   Final    Organism ID to follow GRAM POSITIVE COCCI IN PAIRS AND CHAINS IN BOTH AEROBIC AND ANAEROBIC BOTTLES CRITICAL RESULT CALLED TO, READ BACK BY AND VERIFIED WITH: Jill Stopka BASANTI 02/15/19 @ 2224  MLK Performed at [facility], [address] [place], [place] [address]    Culture (A)  Final    STREPTOCOCCUS PNEUMONIAE SUSCEPTIBILITIES PERFORMED ON PREVIOUS CULTURE WITHIN THE LAST 5 DAYS. Performed at [facility], [address], [place], [place] [address]    Report Status 02/18/2019 FINAL  Final  Blood Culture ID Panel (Reflexed)     Status: Abnormal   Collection Time: 02/15/19  7:21 AM  Result Value Ref Range Status   Enterococcus species NOT DETECTED NOT DETECTED Final   Listeria monocytogenes NOT DETECTED NOT DETECTED Final   Staphylococcus species NOT DETECTED NOT DETECTED Final   Staphylococcus aureus (BCID) NOT DETECTED NOT DETECTED Final   Streptococcus species DETECTED (A) NOT DETECTED Final    Comment: CRITICAL RESULT CALLED TO, READ BACK BY AND VERIFIED WITH: Rosemaria Inabinet BESANTI 02/15/19 @ 2224  MLK    Streptococcus agalactiae NOT DETECTED NOT DETECTED Final   Streptococcus pneumoniae DETECTED (A) NOT DETECTED Final    Comment: CRITICAL RESULT CALLED TO, READ BACK BY AND VERIFIED WITH: Parneet Glantz BESANTI 02/15/19 @ 2224  MLK    Streptococcus pyogenes NOT DETECTED NOT DETECTED Final   Acinetobacter baumannii NOT DETECTED NOT DETECTED Final   Enterobacteriaceae species NOT DETECTED NOT DETECTED Final   Enterobacter cloacae complex NOT DETECTED NOT DETECTED Final   Escherichia coli NOT DETECTED NOT DETECTED Final   Klebsiella oxytoca NOT DETECTED NOT DETECTED Final   Klebsiella  pneumoniae NOT DETECTED NOT DETECTED Final   Proteus species NOT DETECTED NOT DETECTED Final   Serratia marcescens NOT DETECTED NOT DETECTED Final   Haemophilus influenzae NOT DETECTED NOT DETECTED Final   Neisseria meningitidis NOT DETECTED NOT DETECTED Final   Pseudomonas aeruginosa NOT DETECTED NOT DETECTED Final   Candida albicans NOT DETECTED NOT DETECTED Final   Candida glabrata NOT DETECTED NOT DETECTED Final   Candida krusei NOT DETECTED NOT DETECTED Final   Candida parapsilosis NOT DETECTED NOT DETECTED Final   Candida tropicalis NOT DETECTED NOT DETECTED Final    Comment: Performed at [facility], [address], [place], [place] [address]  MRSA PCR Screening  Status: None   Collection Time: 02/15/19 10:24 AM  Result Value Ref Range Status   MRSA by PCR NEGATIVE NEGATIVE Final    Comment:        The GeneXpert MRSA Assay (FDA approved for NASAL specimens only), is one component of a comprehensive MRSA colonization surveillance program. It is not intended to diagnose MRSA infection nor to guide or monitor treatment for MRSA infections. Performed at [facility], [address] [place], [place] [address]   Culture, respiratory (non-expectorated)     Status: None   Collection Time: 02/15/19 10:30 AM  Result Value Ref Range Status   Specimen Description   Final    TRACHEAL ASPIRATE Performed at [facility], [address] [place], [place] [address]    Special Requests   Final    NONE Performed at [facility], [address] [place], [place] [address]    Gram Stain   Final    ABUNDANT WBC PRESENT,BOTH PMN AND MONONUCLEAR FEW GRAM POSITIVE COCCI RARE GRAM POSITIVE RODS Performed at [facility], [address] [place], [place] [address]    Culture ABUNDANT STREPTOCOCCUS PNEUMONIAE  Final   Report Status 02/18/2019 FINAL  Final   Organism ID, Bacteria STREPTOCOCCUS PNEUMONIAE  Final      Susceptibility    Streptococcus pneumoniae - MIC*    ERYTHROMYCIN >=8 RESISTANT Resistant     LEVOFLOXACIN 1 SENSITIVE Sensitive     VANCOMYCIN 0.5 SENSITIVE Sensitive     PENO - penicillin 0.12      PENICILLIN (oral) 0.12 INTERMEDIATE Intermediate     CEFTRIAXONE (non-meningitis) 0.25 SENSITIVE Sensitive     * ABUNDANT STREPTOCOCCUS PNEUMONIAE   Anti-infectives (From admission, onward)   Start     Dose/Rate Route Frequency Ordered Stop   02/17/19 1000  cefTRIAXone (ROCEPHIN) 2 g in sodium chloride 0.9 % 100 mL IVPB     2 g 200 mL/hr over 30 Minutes Intravenous Every 24 hours 02/16/19 1011     02/16/19 0700  cefTRIAXone (ROCEPHIN) 2 g in sodium chloride 0.9 % 100 mL IVPB  Status:  Discontinued     2 g 200 mL/hr over 30 Minutes Intravenous Every 24 hours 02/16/19 0033 02/16/19 1011   02/15/19 2000  ceFEPIme (MAXIPIME) 2 g in sodium chloride 0.9 % 100 mL IVPB  Status:  Discontinued     2 g 200 mL/hr over 30 Minutes Intravenous Every 12 hours 02/15/19 1029 02/16/19 0033   02/15/19 1130  azithromycin (ZITHROMAX) 500 mg in sodium chloride 0.9 % 250 mL IVPB  Status:  Discontinued     500 mg 250 mL/hr over 60 Minutes Intravenous Every 24 hours 02/15/19 1023 02/16/19 0033   02/15/19 0700  vancomycin (VANCOCIN) 1,500 mg in sodium chloride 0.9 % 500 mL IVPB     1,500 mg 250 mL/hr over 120 Minutes Intravenous  Once 02/15/19 0658 02/15/19 1039   02/15/19 0645  vancomycin (VANCOCIN) IVPB 1000 mg/200 mL premix  Status:  Discontinued     1,000 mg 200 mL/hr over 60 Minutes Intravenous  Once 02/15/19 0644 02/15/19 0658   02/15/19 0645  ceFEPIme (MAXIPIME) 2 g in sodium chloride 0.9 % 100 mL IVPB     2 g 200 mL/hr over 30 Minutes Intravenous  Once 02/15/19 0644 02/15/19 0806     CXR: No new film  IMPRESSION: Pneumococcal pneumonia with bacteremia Severe sepsis with septic shock, resolved Acute hypoxemic respiratory failure, slowly improving VDRF, resolved -  (intubated 3/10-3/11)  AKI, resolved Hyperkalemia,  resolved Hypokalemia, resolved Mild hypernatremia, resolved Mild thrombocytopenia, improving History of polysubstance abuse including opioid use disorder UDS positive for multiple substances on admission Severe acute encephalopathy/TME, improving Severe protein-calorie malnutrition  PLAN/REC: Continue supplemental oxygen as needed to maintain SPO2 >90% Monitor BMET intermittently Monitor I/Os Correct electrolytes as indicated IVFs adjusted Monitor temp, WBC count Micro and abx as above Complete 7 days ceftriaxone (through 3/16) DVT px: SCDs.  Enoxaparin discontinued 3/13 Monitor CBC intermittently Transfuse per usual guidelines Continue Suboxone.  Dose decreased to 3/14 We will discontinue dexmedetomidine infusion  If tolerates off, can transfer to MedSurg floor Advance activity and nutrition as able   Billy Fischer, MD PCCM service Mobile [phone] Pager [phone] 02/20/2019 12:46 PM

## 2019-02-21 ENCOUNTER — Encounter: Payer: Self-pay | Admitting: Pulmonary Disease

## 2019-02-21 LAB — MAGNESIUM: Magnesium: 1.5 mg/dL — ABNORMAL LOW (ref 1.7–2.4)

## 2019-02-21 LAB — HEPATIC FUNCTION PANEL
ALT: 17 U/L (ref 0–44)
AST: 33 U/L (ref 15–41)
Albumin: 2.3 g/dL — ABNORMAL LOW (ref 3.5–5.0)
Alkaline Phosphatase: 68 U/L (ref 38–126)
Bilirubin, Direct: 0.3 mg/dL — ABNORMAL HIGH (ref 0.0–0.2)
Indirect Bilirubin: 0.4 mg/dL (ref 0.3–0.9)
Total Bilirubin: 0.7 mg/dL (ref 0.3–1.2)
Total Protein: 6 g/dL — ABNORMAL LOW (ref 6.5–8.1)

## 2019-02-21 LAB — PROTIME-INR
INR: 1.4 — ABNORMAL HIGH (ref 0.8–1.2)
Prothrombin Time: 17.4 seconds — ABNORMAL HIGH (ref 11.4–15.2)

## 2019-02-21 LAB — FIBRINOGEN: Fibrinogen: 382 mg/dL (ref 210–475)

## 2019-02-21 MED ORDER — ADULT MULTIVITAMIN W/MINERALS CH
1.0000 | ORAL_TABLET | Freq: Every day | ORAL | Status: DC
  Administered 2019-02-21 – 2019-02-24 (×3): 1 via ORAL
  Filled 2019-02-21 (×4): qty 1

## 2019-02-21 MED ORDER — HALOPERIDOL LACTATE 5 MG/ML IJ SOLN
1.0000 mg | Freq: Four times a day (QID) | INTRAMUSCULAR | Status: DC | PRN
  Administered 2019-02-21 – 2019-02-22 (×2): 1 mg via INTRAVENOUS
  Filled 2019-02-21 (×2): qty 1

## 2019-02-21 MED ORDER — LORAZEPAM 2 MG/ML IJ SOLN
1.0000 mg | Freq: Four times a day (QID) | INTRAMUSCULAR | Status: AC | PRN
  Administered 2019-02-22 – 2019-02-24 (×2): 1 mg via INTRAVENOUS
  Filled 2019-02-21 (×2): qty 1

## 2019-02-21 MED ORDER — LORAZEPAM 2 MG/ML IJ SOLN
0.0000 mg | Freq: Four times a day (QID) | INTRAMUSCULAR | Status: AC
  Administered 2019-02-22 (×4): 2 mg via INTRAVENOUS
  Filled 2019-02-21 (×4): qty 1

## 2019-02-21 MED ORDER — LORAZEPAM 1 MG PO TABS: 1.0000 mg | ORAL_TABLET | Freq: Four times a day (QID) | ORAL | Status: AC | PRN

## 2019-02-21 MED ORDER — THIAMINE HCL 100 MG/ML IJ SOLN: 100.0000 mg | Freq: Every day | INTRAMUSCULAR | Status: DC

## 2019-02-21 MED ORDER — FOLIC ACID 1 MG PO TABS
1.0000 mg | ORAL_TABLET | Freq: Every day | ORAL | Status: DC
  Administered 2019-02-21 – 2019-02-24 (×4): 1 mg via ORAL
  Filled 2019-02-21 (×4): qty 1

## 2019-02-21 MED ORDER — MAGNESIUM SULFATE 2 GM/50ML IV SOLN
2.0000 g | Freq: Once | INTRAVENOUS | Status: AC
  Administered 2019-02-21: 2 g via INTRAVENOUS
  Filled 2019-02-21: qty 50

## 2019-02-21 MED ORDER — VITAMIN B-1 100 MG PO TABS
100.0000 mg | ORAL_TABLET | Freq: Every day | ORAL | Status: DC
  Administered 2019-02-21 – 2019-02-24 (×4): 100 mg via ORAL
  Filled 2019-02-21 (×4): qty 1

## 2019-02-21 MED ORDER — LORAZEPAM 2 MG/ML IJ SOLN
0.0000 mg | Freq: Two times a day (BID) | INTRAMUSCULAR | Status: DC
  Administered 2019-02-23 (×2): 2 mg via INTRAVENOUS
  Filled 2019-02-21 (×2): qty 1

## 2019-02-21 NOTE — Plan of Care (Signed)
  Problem: Education: Goal: Knowledge of General Education information will improve Description Including pain rating scale, medication(s)/side effects and non-pharmacologic comfort measures Outcome: Progressing   Problem: Health Behavior/Discharge Planning: Goal: Ability to manage health-related needs will improve Outcome: Progressing   Problem: Clinical Measurements: Goal: Ability to maintain clinical measurements within normal limits will improve Outcome: Progressing Goal: Will remain free from infection Outcome: Progressing Goal: Diagnostic test results will improve Outcome: Progressing Goal: Respiratory complications will improve Outcome: Progressing Goal: Cardiovascular complication will be avoided Outcome: Progressing   Problem: Activity: Goal: Risk for activity intolerance will decrease Outcome: Progressing   Problem: Nutrition: Goal: Adequate nutrition will be maintained Outcome: Progressing   Problem: Coping: Goal: Level of anxiety will decrease Outcome: Progressing   Problem: Elimination: Goal: Will not experience complications related to bowel motility Outcome: Progressing Goal: Will not experience complications related to urinary retention Outcome: Progressing   Problem: Pain Managment: Goal: General experience of comfort will improve Outcome: Progressing   Problem: Safety: Goal: Ability to remain free from injury will improve Outcome: Progressing   Problem: Skin Integrity: Goal: Risk for impaired skin integrity will decrease Outcome: Progressing    Ready to move out of ICU; no longer requires SDU.

## 2019-02-21 NOTE — Progress Notes (Signed)
CRITICAL CARE NOTE       SUBJECTIVE FINDINGS & SIGNIFICANT EVENTS    Patient remains critically ill.  Husband reports she has been using high amounts of crack cocaine recently with excessive tobacco smoking worsening her COPD/respiratory status Prognosis is guarded, possibly in withdrawal will start CIWA protocol -Discussed care plan with husband today  PAST MEDICAL HISTORY   Past Medical History:  Diagnosis Date  . COPD (chronic obstructive pulmonary disease) (HCC)   . Hepatitis C   . Hypertension      SURGICAL HISTORY   Past Surgical History:  Procedure Laterality Date  . CHOLECYSTECTOMY    . prolapsed rectum       FAMILY HISTORY   Family History  Problem Relation Age of Onset  . Cancer Mother      SOCIAL HISTORY   Social History   Tobacco Use  . Smoking status: Current Every Day Smoker    Packs/day: 1.00    Years: 44.00    Pack years: 44.00    Types: Cigarettes  . Smokeless tobacco: Never Used  Substance Use Topics  . Alcohol use: No  . Drug use: Yes    Types: Cocaine    Comment: valium     MEDICATIONS   Current Medication:  Current Facility-Administered Medications:  .  acetaminophen (TYLENOL) tablet 650 mg, 650 mg, Oral, Q6H PRN, 650 mg at 02/20/19 1130 **OR** acetaminophen (TYLENOL) suppository 650 mg, 650 mg, Rectal, Q6H PRN, Merwyn Katos, MD .  buprenorphine-naloxone (SUBOXONE) 2-0.5 mg per SL tablet 1 tablet, 1 tablet, Sublingual, QHS, Merwyn Katos, MD, 1 tablet at 02/20/19 2112 .  Chlorhexidine Gluconate Cloth 2 % PADS 6 each, 6 each, Topical, Daily, Harlon Ditty D, NP, 6 each at 02/20/19 2112 .  hydrALAZINE (APRESOLINE) injection 10-20 mg, 10-20 mg, Intravenous, Q4H PRN, Merwyn Katos, MD .  ipratropium-albuterol (DUONEB) 0.5-2.5 (3) MG/3ML nebulizer  solution 3 mL, 3 mL, Nebulization, Q4H PRN, Merwyn Katos, MD, 3 mL at 02/21/19 0022 .  lactated ringers infusion, , Intravenous, Continuous, Merwyn Katos, MD, Last Rate: 50 mL/hr at 02/21/19 1000 .  LORazepam (ATIVAN) injection 0.5 mg, 0.5 mg, Intravenous, Q4H PRN, Merwyn Katos, MD, 0.5 mg at 02/21/19 0618 .  MEDLINE mouth rinse, 15 mL, Mouth Rinse, BID, Merwyn Katos, MD, 15 mL at 02/20/19 2126 .  metoprolol tartrate (LOPRESSOR) injection 2.5-5 mg, 2.5-5 mg, Intravenous, Q3H PRN, Merwyn Katos, MD, 2.5 mg at 02/20/19 2112 .  [DISCONTINUED] ondansetron (ZOFRAN) tablet 4 mg, 4 mg, Oral, Q6H PRN **OR** ondansetron (ZOFRAN) injection 4 mg, 4 mg, Intravenous, Q6H PRN, Houston Siren, MD, 4 mg at 02/21/19 0005 .  sodium chloride flush (NS) 0.9 % injection 10-40 mL, 10-40 mL, Intracatheter, Q12H, Harlon Ditty D, NP, 10 mL at 02/21/19 0925 .  sodium chloride flush (NS) 0.9 % injection 10-40 mL, 10-40 mL, Intracatheter, PRN, Judithe Modest, NP    ALLERGIES   Patient has no known allergies.    REVIEW OF SYSTEMS    Vitals:   02/21/19 0900 02/21/19 1000  BP: (!) 109/59 108/80  Pulse: 90 100  Resp: (!) 23   Temp:    SpO2: 94% 95%    PATIENT IS UNABLE TO PROVIDE COMPLETE REVIEW OF SYSTEMS DUE TO ACUTE CRITICAL ILLNESS    PHYSICAL EXAMINATION   GENERAL:critically ill appearing, inconsistently responds, moaning in between HEAD: Normocephalic, atraumatic.  EYES: Pupils equal, round, reactive to light.  No scleral icterus.  MOUTH: Moist  mucosal membrane. NECK: Supple. No thyromegaly. No nodules. No JVD.  PULMONARY: + Rhonchorous breath sounds bilaterally CARDIOVASCULAR: S1 and S2. Regular rate and rhythm. No murmurs, rubs, or gallops.  GASTROINTESTINAL: Soft, nontender, non-distended. No masses. Positive bowel sounds. No hepatosplenomegaly.  MUSCULOSKELETAL: No swelling, clubbing, or edema.  NEUROLOGIC: Mild distress due to acute illness SKIN:intact,warm,dry    LABS AND IMAGING     -I personally reviewed most recent blood work, imaging and microbiology - significant findings today are hypokalemia, anemia thrombocytopenia.  LAB RESULTS: Recent Labs  Lab 02/18/19 0343 02/19/19 0356 02/20/19 0529  NA 146* 145 141  K 2.8* 3.7 3.7  CL 111 107 103  CO2 29 31 33*  BUN 24* 19 12  CREATININE 0.39* 0.48 0.40*  GLUCOSE 80 109* 119*   Recent Labs  Lab 02/18/19 0343 02/19/19 0356 02/20/19 0529  HGB 10.3* 10.1* 10.1*  HCT 33.6* 32.4* 32.4*  WBC 4.5 4.9 6.0  PLT 66* 74* 84*     IMAGING RESULTS: No results found.    ASSESSMENT AND PLAN    -Multidisciplinary rounds held today  Acute Hypoxic Respiratory Failure -Due to strep pneumo pneumonia as well as streptococcal pneumoniae bacteremia -Currently on 3 L nasal cannula -continue Bronchodilator Therapy -Continue current antibiotics   CARDIAC FAILURE- -circulatory shock likely due to sepsis-improved currently hemodynamically stable -oxygen as needed ICU monitoring  Thrombocytopenia    -Likely due to sepsis,  - will obtain fibrinogen to rule out DIC  -HCV antibody  to r/o HCV as etiology - PF4antibodies - will consider    NEUROLOGY -Mentation improved, patient is agitated and confused at times with moaning in between -Possible drug withdrawal syndrome/unknown EtOH use-we will place on CIWA   Septic shock- resolved      Currently hemodynamically stable -follow up cultures -emperic ABX -consider stress dose steroids   ID -continue IV abx as prescibed -follow up cultures  GI/Nutrition GI PROPHYLAXIS as indicated DIET-->TF's as tolerated Constipation protocol as indicated  ENDO - ICU hypoglycemic\Hyperglycemia protocol -check FSBS per protocol   ELECTROLYTES -follow labs as needed -replace as needed -pharmacy consultation   DVT/GI PRX ordered -SCDs  TRANSFUSIONS AS NEEDED MONITOR FSBS ASSESS the need for LABS as needed   Critical care provider  statement:    Critical care time (minutes):  36   Critical care time was exclusive of:  Separately billable procedures and treating other patients   Critical care was necessary to treat or prevent imminent or life-threatening deterioration of the following conditions:   Sepsis secondary to strep pneumoniae bacteremia, pneumonia secondary to strep pneumo, circulatory shock, thrombocytopenia, altered mental status with lethargy   Critical care was time spent personally by me on the following activities:  Development of treatment plan with patient or surrogate, discussions with consultants, evaluation of patient's response to treatment, examination of patient, obtaining history from patient or surrogate, ordering and performing treatments and interventions, ordering and review of laboratory studies and re-evaluation of patient's condition.  I assumed direction of critical care for this patient from another provider in my specialty: no    This document was prepared using Dragon voice recognition software and may include unintentional dictation errors.    Vida Rigger, M.D.  Division of Pulmonary & Critical Care Medicine  [facility]

## 2019-02-21 NOTE — Progress Notes (Addendum)
Patient ID: Kylie Monroe, female   DOB: 12/27/1955, 63 y.o.   MRN: [id number]  [employer] PROGRESS NOTE  Kylie Monroe XBJ:[id number] DOB: 11-25-1956 DOA: 02/15/2019 PCP: Donaciano Eva, FNP  HPI/Subjective: Patient with some agitation.  Complains of some abdominal pain.  Off of Precedex drip at this point.  Spoke with husband at the bedside.  He states that she is able to take care of herself normally at home.  She came in with altered mental status and knew she had pneumonia.  Objective: Vitals:   02/21/19 1435 02/21/19 1500  BP: 125/62 103/62  Pulse: 94 92  Resp: (!) 21 16  Temp:    SpO2: 95% (!) 89%    Filed Weights   02/15/19 0653 02/15/19 1013 02/20/19 1300  Weight: 60.8 kg 60.4 kg 62.8 kg    ROS: Review of Systems  Unable to perform ROS: Acuity of condition  Respiratory: Positive for cough and shortness of breath.   Cardiovascular: Negative for chest pain.  Gastrointestinal: Positive for abdominal pain. Negative for constipation, diarrhea, nausea and vomiting.   Exam: Physical Exam  HENT:  Nose: No mucosal edema.  Mouth/Throat: No oropharyngeal exudate or posterior oropharyngeal edema.  Eyes: Pupils are equal, round, and reactive to light. Conjunctivae, EOM and lids are normal.  Neck: No JVD present. Carotid bruit is not present. No edema present. No thyroid mass and no thyromegaly present.  Cardiovascular: S1 normal and S2 normal. Exam reveals no gallop.  No murmur heard. Pulses:      Dorsalis pedis pulses are 2+ on the right side and 2+ on the left side.  Respiratory: No respiratory distress. She has decreased breath sounds in the right lower field and the left lower field. She has no wheezes. She has rhonchi in the right lower field and the left lower field. She has no rales.  GI: Soft. Bowel sounds are normal. There is no abdominal tenderness.  Musculoskeletal:     Right ankle: She exhibits no swelling.     Left ankle: She exhibits no swelling.   Lymphadenopathy:    She has no cervical adenopathy.  Neurological: She is alert. No cranial nerve deficit.  Skin: Skin is warm. No rash noted. Nails show no clubbing.  Psychiatric: She has a normal mood and affect.      Data Reviewed: Basic Metabolic Panel: Recent Labs  Lab 02/16/19 1642 02/17/19 0441 02/18/19 0343 02/19/19 0356 02/20/19 0529 02/21/19 0951  NA 142 142 146* 145 141  --   K 3.0* 2.7* 2.8* 3.7 3.7  --   CL 109 107 111 107 103  --   CO2 33*  --   GLUCOSE 134* 128* 80 109* 119*  --   BUN 18 20 24* 19 12  --   CREATININE 0.58 0.55 0.39* 0.48 0.40*  --   CALCIUM 8.1* 8.3* 8.8* 8.3* 7.8*  --   MG  --   --   --  1.6*  --  1.5*  PHOS  --   --   --  2.1*  --   --    Liver Function Tests: Recent Labs  Lab 02/16/19 0532 02/20/19 0529  AST 71* 29  ALT 33 16  ALKPHOS 53 63  BILITOT 2.9* 0.9  PROT 6.2* 5.8*  ALBUMIN 2.5* 2.2*    Recent Labs  Lab 02/20/19 0529  AMMONIA 55*   CBC: Recent Labs  Lab 02/16/19 0532 02/17/19 0441 02/18/19 0343 02/19/19 0356 02/20/19 3086  WBC 7.7 8.9 4.5 4.9 6.0  HGB 11.0* 11.4* 10.3* 10.1* 10.1*  HCT 35.2* 36.1 33.6* 32.4* 32.4*  MCV 93.6 92.3 94.1 94.5 95.3  PLT 72* 88* 66* 74* 84*   Cardiac Enzymes: Recent Labs  Lab 02/15/19 0631  TROPONINI <0.03    CBG: Recent Labs  Lab 02/15/19 2349 02/16/19 0026 02/16/19 0417 02/16/19 0726 02/17/19 0729  GLUCAP 69* 81 118* 126* 101*    Recent Results (from the past 240 hour(s))  Urine culture     Status: None   Collection Time: 02/15/19  7:20 AM  Result Value Ref Range Status   Specimen Description   Final    URINE, RANDOM Performed at [facility], [address] [place], [place] [address]    Special Requests   Final    NONE Performed at [facility], [address], [place], [place] [address]    Culture   Final    NO GROWTH Performed at [facility], [address] [place], [place] [address]    Report Status  02/16/2019 FINAL  Final  Blood Culture (routine x 2)     Status: Abnormal   Collection Time: 02/15/19  7:21 AM  Result Value Ref Range Status   Specimen Description   Final    BLOOD RIGHT ANTECUBITAL Performed at [facility], [address] [place], [place] [address]    Special Requests   Final    BOTTLES DRAWN AEROBIC AND ANAEROBIC Blood Culture results may not be optimal due to an excessive volume of blood received in culture bottles Performed at [facility], [address], [place], [place] [address]    Culture  Setup Time   Final    GRAM POSITIVE COCCI IN PAIRS AND CHAINS IN BOTH AEROBIC AND ANAEROBIC BOTTLES CRITICAL RESULT CALLED TO, READ BACK BY AND VERIFIED WITH: DAVID BESANTI 02/15/19 @ 2224  MLK Performed at [facility], [address], [place], [place] [address]    Culture STREPTOCOCCUS PNEUMONIAE (A)  Final   Report Status 02/18/2019 FINAL  Final   Organism ID, Bacteria STREPTOCOCCUS PNEUMONIAE  Final      Susceptibility   Streptococcus pneumoniae - MIC*    ERYTHROMYCIN >=8 RESISTANT Resistant     LEVOFLOXACIN 1 SENSITIVE Sensitive     VANCOMYCIN 0.5 SENSITIVE Sensitive     PENICILLIN (meningitis) 0.12 RESISTANT Resistant     PENICILLIN (non-meningitis) 0.12 SENSITIVE Sensitive     CEFTRIAXONE (non-meningitis) 0.25 SENSITIVE Sensitive     CEFTRIAXONE (meningitis) 0.25 SENSITIVE Sensitive     * STREPTOCOCCUS PNEUMONIAE  Blood Culture (routine x 2)     Status: Abnormal   Collection Time: 02/15/19  7:21 AM  Result Value Ref Range Status   Specimen Description   Final    BLOOD BLOOD RIGHT FOREARM Performed at [facility], [address], [place], [place] [address]    Special Requests   Final    BOTTLES DRAWN AEROBIC AND ANAEROBIC Blood Culture results may not be optimal due to an excessive volume of blood received in culture bottles Performed at [facility], [address] [place], [place] [address]    Culture   Setup Time   Final    Organism ID to follow GRAM POSITIVE COCCI IN PAIRS AND CHAINS IN BOTH AEROBIC AND ANAEROBIC BOTTLES CRITICAL RESULT CALLED TO, READ BACK BY AND VERIFIED WITH: DAVID BASANTI 02/15/19 @ 2224  MLK Performed at [facility], [address]  [place], [place] [address]    Culture (A)  Final    STREPTOCOCCUS PNEUMONIAE SUSCEPTIBILITIES PERFORMED ON PREVIOUS CULTURE WITHIN THE LAST 5 DAYS. Performed at [facility], [address], [place], [place] [address]    Report Status 02/18/2019 FINAL  Final  Blood Culture ID Panel (Reflexed)     Status: Abnormal   Collection Time: 02/15/19  7:21 AM  Result Value Ref Range Status   Enterococcus species NOT DETECTED NOT DETECTED Final   Listeria monocytogenes NOT DETECTED NOT DETECTED Final   Staphylococcus species NOT DETECTED NOT DETECTED Final   Staphylococcus aureus (BCID) NOT DETECTED NOT DETECTED Final   Streptococcus species DETECTED (A) NOT DETECTED Final    Comment: CRITICAL RESULT CALLED TO, READ BACK BY AND VERIFIED WITH: DAVID BESANTI 02/15/19 @ 2224  MLK    Streptococcus agalactiae NOT DETECTED NOT DETECTED Final   Streptococcus pneumoniae DETECTED (A) NOT DETECTED Final    Comment: CRITICAL RESULT CALLED TO, READ BACK BY AND VERIFIED WITH: DAVID BESANTI 02/15/19 @ 2224  MLK    Streptococcus pyogenes NOT DETECTED NOT DETECTED Final   Acinetobacter baumannii NOT DETECTED NOT DETECTED Final   Enterobacteriaceae species NOT DETECTED NOT DETECTED Final   Enterobacter cloacae complex NOT DETECTED NOT DETECTED Final   Escherichia coli NOT DETECTED NOT DETECTED Final   Klebsiella oxytoca NOT DETECTED NOT DETECTED Final   Klebsiella pneumoniae NOT DETECTED NOT DETECTED Final   Proteus species NOT DETECTED NOT DETECTED Final   Serratia marcescens NOT DETECTED NOT DETECTED Final   Haemophilus influenzae NOT DETECTED NOT DETECTED Final   Neisseria meningitidis NOT DETECTED NOT DETECTED Final   Pseudomonas  aeruginosa NOT DETECTED NOT DETECTED Final   Candida albicans NOT DETECTED NOT DETECTED Final   Candida glabrata NOT DETECTED NOT DETECTED Final   Candida krusei NOT DETECTED NOT DETECTED Final   Candida parapsilosis NOT DETECTED NOT DETECTED Final   Candida tropicalis NOT DETECTED NOT DETECTED Final    Comment: Performed at [facility], [address] [place], [place] [address]  MRSA PCR Screening     Status: None   Collection Time: 02/15/19 10:24 AM  Result Value Ref Range Status   MRSA by PCR NEGATIVE NEGATIVE Final    Comment:        The GeneXpert MRSA Assay (FDA approved for NASAL specimens only), is one component of a comprehensive MRSA colonization surveillance program. It is not intended to diagnose MRSA infection nor to guide or monitor treatment for MRSA infections. Performed at [facility], [address] [place], [place] [address]   Culture, respiratory (non-expectorated)     Status: None   Collection Time: 02/15/19 10:30 AM  Result Value Ref Range Status   Specimen Description   Final    TRACHEAL ASPIRATE Performed at [facility], [address], [place], [place] [address]    Special Requests   Final    NONE Performed at [facility], [address] [place], [place] [address]    Gram Stain   Final    ABUNDANT WBC PRESENT,BOTH PMN AND MONONUCLEAR FEW GRAM POSITIVE COCCI RARE GRAM POSITIVE RODS Performed at [facility], [address] [place], [place] [address]    Culture ABUNDANT STREPTOCOCCUS PNEUMONIAE  Final   Report Status 02/18/2019 FINAL  Final   Organism ID, Bacteria STREPTOCOCCUS PNEUMONIAE  Final      Susceptibility   Streptococcus pneumoniae - MIC*    ERYTHROMYCIN >=8 RESISTANT Resistant  LEVOFLOXACIN 1 SENSITIVE Sensitive     VANCOMYCIN 0.5 SENSITIVE Sensitive     PENO - penicillin 0.12      PENICILLIN (oral) 0.12 INTERMEDIATE Intermediate     CEFTRIAXONE (non-meningitis) 0.25 SENSITIVE  Sensitive     * ABUNDANT STREPTOCOCCUS PNEUMONIAE      Scheduled Meds: . buprenorphine-naloxone  1 tablet Sublingual QHS  . Chlorhexidine Gluconate Cloth  6 each Topical Daily  . folic acid  1 mg Oral Daily  . LORazepam  0-4 mg Intravenous Q6H   Followed by  . [START ON 02/23/2019] LORazepam  0-4 mg Intravenous Q12H  . mouth rinse  15 mL Mouth Rinse BID  . multivitamin with minerals  1 tablet Oral Daily  . sodium chloride flush  10-40 mL Intracatheter Q12H  . thiamine  100 mg Oral Daily   Or  . thiamine  100 mg Intravenous Daily   Continuous Infusions: . lactated ringers 50 mL/hr at 02/21/19 1500    Assessment/Plan:  1. Acute delirium secondary to sepsis.  Haldol as needed.  I doubt alcohol withdrawal even though put on CIWA protocol.  Off Precedex drip.  Ammonia level slightly high.  May end up needing to treat this if mental status does not improve. 2. Sepsis/pneumonia with Streptococcus pneumoniae.  Continue IV Rocephin 3. Acute on chronic hypoxic respiratory failure.  Patient chronically wears 3 L of oxygen.  I dialed her down to 3 L. 4. COPD exacerbation.  Steroids stopped.  Continue nebulizer treatments 5. Chronic thrombocytopenia likely secondary to hepatitis C. on prior imaging she may have cirrhosis of the liver also. 6. Abdominal pain.  Continue to monitor closely.  Code Status:     Code Status Orders  (From admission, onward)         Start     Ordered   02/15/19 1040  Full code  Continuous     02/15/19 1039        Code Status History    Date Active Date Inactive Code Status Order ID Comments User Context   10/24/2018 0816 11/07/2018 1925 Full Code [id number]  Arnaldo Natal, MD Inpatient     Family Communication: Husband at the bedside Disposition Plan: To be determined  Consultants:  Critical care specialist  Antibiotics:  IV Rocephin  Time spent: 28 minutes.  Case discussed with nursing staff, critical care specialist and husband at the  bedside  [employer]

## 2019-02-22 LAB — HEPATITIS C ANTIBODY: HCV Ab: >11 s/co ratio — ABNORMAL HIGH (ref 0.0–0.9)

## 2019-02-22 MED ORDER — LACTULOSE 10 GM/15ML PO SOLN
20.0000 g | Freq: Every day | ORAL | Status: DC
  Administered 2019-02-23 – 2019-02-24 (×2): 20 g via ORAL
  Filled 2019-02-22 (×2): qty 30

## 2019-02-22 MED ORDER — SODIUM CHLORIDE 0.9 % IV SOLN
INTRAVENOUS | Status: DC | PRN
  Administered 2019-02-22: 16:00:00 5 mL via INTRAVENOUS
  Administered 2019-02-24: 250 mL via INTRAVENOUS

## 2019-02-22 MED ORDER — SODIUM CHLORIDE 0.9 % IV SOLN
2.0000 g | Freq: Every day | INTRAVENOUS | Status: DC
  Administered 2019-02-22 – 2019-02-24 (×3): 2 g via INTRAVENOUS
  Filled 2019-02-22 (×2): qty 2
  Filled 2019-02-22: qty 20
  Filled 2019-02-22: qty 2

## 2019-02-22 NOTE — Progress Notes (Signed)
  Speech Language Pathology Treatment: Dysphagia  Patient Details Name: Kylie Monroe MRN: [id number] DOB: March 31, 1956 Today's Date: 02/22/2019 Time: 1130-1205 SLP Time Calculation (min) (ACUTE ONLY): 35 min  Assessment / Plan / Recommendation Clinical Impression  Pt seen for ongoing assessment of her swallowing function; toleration of diet and attempt to upgrade diet consistency (liquid). Pt continues to have confusion and agitation; weaned from Precedex at this time but remains in CCU. She presented today w/ eyes closed most of the session; mumbled speech and agitation post session.  Pt positioned upright and given small, cup sip trials of thin liquids monitored carefully by SLP. Pt was given verbal and tactile cues to increase attention to po tasks as cup was placed at her lips for drinking. Pt consumed first 3 trials w/ no overt coughing, however, upon 4th attempt, she exhibited immediate, congested coughing requiring time to calm her breathing. No gross decline in O2 sats; HR and RR returned to their baseline post coughing. Time spent educating pt on need to continue w/ current diet consistency for ease of oral phase and no mastication; and safety swallowing using the Nectar liquids. Recommend strict aspiration precautions; Supervision and feeding at all meals; pills in Puree. When pt's mental/Cognitive status has improved, an objective swallowing assessment could be considered in order to assess swallowing of thin liquid consistency; safety w/ diet upgrade of liquids. NSG updated on tx session outcomes. ST services will f/u next 2-3 days.    HPI HPI: Pt is a 63 y.o. female with a known history of COPD, hepatitis C, history of heroin abuse on Suboxone program, hypertension who presented to the hospital due to altered mental status and hypoxia.  Patient is presently intubated and sedated therefore most of the history obtained from the chart and from the ER physician.  Patient's boyfriend apparently  found her unresponsive this morning and therefore called EMS.  EMS on arrival noted that the patient was significantly hypoxic with O2 sats in the high 70s to low 80s.  Patient was urgently brought to the ER noted to be in acute respiratory failure with hypoxia.  Patient's chest x-ray findings are suggestive of left-sided pneumonia and patient was started on broad-spectrum antibiotics and given IV fluids.  While in the ER patient became quite agitated and would not follow any commands and was very restless and therefore was intubated for airway protection and agitation.  Pt was extubated; remains confused.      SLP Plan  Continue with current plan of care       Recommendations  Diet recommendations: Dysphagia 1 (puree);Nectar-thick liquid Liquids provided via: Cup;Straw(monitored) Medication Administration: Crushed with puree(as able for safer swallowing) Supervision: Staff to assist with self feeding;Full supervision/cueing for compensatory strategies Compensations: Minimize environmental distractions;Slow rate;Small sips/bites;Lingual sweep for clearance of pocketing;Multiple dry swallows after each bite/sip;Follow solids with liquid(Edentulous) Postural Changes and/or Swallow Maneuvers: Seated upright 90 degrees;Upright 30-60 min after meal                General recommendations: (Dietician f/u) Oral Care Recommendations: Oral care BID;Staff/trained caregiver to provide oral care Follow up Recommendations: Skilled Nursing facility SLP Visit Diagnosis: Dysphagia, oropharyngeal phase (R13.12) Plan: Continue with current plan of care       GO                Kylie Som, MS, CCC-SLP Kylie Monroe 02/22/2019, 12:44 PM

## 2019-02-22 NOTE — Progress Notes (Signed)
Initial Nutrition Assessment  DOCUMENTATION CODES:   Severe malnutrition in context of chronic illness  INTERVENTION:  Provide Hormel Shake (Vital Cuisine) po TID with meals, each supplement provides 520 kcal and 22 grams of protein.  Continue 1:1 assistance at meals.  NUTRITION DIAGNOSIS:   Severe Malnutrition related to chronic illness(COPD, hepatitis C, cocaine abuse) as evidenced by severe fat depletion, severe muscle depletion.  GOAL:   Patient will meet greater than or equal to 90% of their needs  MONITOR:   PO intake, Supplement acceptance, Labs, Weight trends, I & O's  REASON FOR ASSESSMENT:   Rounds    ASSESSMENT:   63 year old female with PMHx of hepatitis C, HTN, COPD, cocaine abuse, tobacco abuse admitted with PNA and bacteremia.   Met with patient at bedside this morning. She was lethargic and would only open her eyes and moan. Unable to provide any nutrition/weight history. Her significant other not present at time of RD assessment. Patient is known to this RD from previous admission. At that time she met criteria for moderate chronic malnutrition. RN reports she fed patient breakfast this morning and she ate fairly well (about 75%). Patient seen by SLP today and will remain on dysphagia 1 diet with nectar-thick liquids.   Limited weight history in chart. Patient was 57.1 kg on 11/18. Currently 62.8 kg (138.45 lbs).  Medications reviewed and include: folic acid 1 mg daily, lactulose, MVI daily, thiamine 100 mg daily, LR @ 50 mL/hr.  Labs reviewed: On 3/16 magnesium 1.5.  Discussed with RN and on rounds.  NUTRITION - FOCUSED PHYSICAL EXAM:    Most Recent Value  Orbital Region  Severe depletion  Upper Arm Region  Severe depletion  Thoracic and Lumbar Region  Severe depletion  Buccal Region  Severe depletion  Temple Region  Severe depletion  Clavicle Bone Region  Severe depletion  Clavicle and Acromion Bone Region  Severe depletion  Scapular Bone Region   Severe depletion  Dorsal Hand  Severe depletion  Patellar Region  Severe depletion  Anterior Thigh Region  Severe depletion  Posterior Calf Region  Moderate depletion  Edema (RD Assessment)  None  Hair  Reviewed  Eyes  Unable to assess  Mouth  Reviewed [edentulous]  Skin  Reviewed  Nails  Reviewed     Diet Order:   Diet Order            DIET - DYS 1 Room service appropriate? Yes with Assist; Fluid consistency: Nectar Thick  Diet effective now             EDUCATION NEEDS:   Not appropriate for education at this time  Skin:  Skin Assessment: Reviewed RN Assessment(ecchymosis to bilateral arms)  Last BM:  02/21/2019 - large type 6  Height:   Ht Readings from Last 1 Encounters:  02/15/19 5' (1.524 m)   Weight:   Wt Readings from Last 1 Encounters:  02/20/19 62.8 kg   Ideal Body Weight:  45.5 kg  BMI:  Body mass index is 27.04 kg/m.  Estimated Nutritional Needs:   Kcal:  1600-1900  Protein:  90-100 grams  Fluid:  1.6-1.9 L/day  Willey Blade, MS, RD, LDN Office: [phone] Pager: [phone] After Hours/Weekend Pager: [phone]

## 2019-02-22 NOTE — Progress Notes (Addendum)
Transferred to 103 via bed. Patient still verbally threatening as she rolled out the door. CHG bath done as patient complained constantly. Incontinent of urine x 3 during bath and subsequent move to new bed. Remained in NSR/STac. as normal during move. Moon Boots placed on heels for redness and unstageable  areas present on admission. Transferred to room 103 . Report given to [facility].

## 2019-02-22 NOTE — Progress Notes (Signed)
Patient ID: Kylie Monroe, female   DOB: [birth year], 63 y.o.   MRN: [id number]  Patient ID: Kylie Monroe, female   DOB: 1956/05/28, 63 y.o.   MRN: [id number]  [facility] PROGRESS NOTE  [name] NWG:[id number] DOB: 1956/04/26 DOA: 02/15/2019 PCP: Donaciano Eva, FNP  HPI/Subjective: Seen earlier.  Patient open her eyes and closes her eyes.  Objective: Vitals:   02/22/19 1000 02/22/19 1100  BP: 115/65 (!) 95/55  Pulse: 97 87  Resp: (!) 22 16  Temp:    SpO2: 92% 97%    Filed Weights   02/15/19 0653 02/15/19 1013 02/20/19 1300  Weight: 60.8 kg 60.4 kg 62.8 kg    ROS: Review of Systems  Unable to perform ROS: Acuity of condition   Exam: Physical Exam  HENT:  Nose: No mucosal edema.  Mouth/Throat: No oropharyngeal exudate or posterior oropharyngeal edema.  Eyes: Pupils are equal, round, and reactive to light. Conjunctivae, EOM and lids are normal.  Neck: No JVD present. Carotid bruit is not present. No edema present. No thyroid mass and no thyromegaly present.  Cardiovascular: S1 normal and S2 normal. Exam reveals no gallop.  No murmur heard. Pulses:      Dorsalis pedis pulses are 2+ on the right side and 2+ on the left side.  Respiratory: No respiratory distress. She has decreased breath sounds in the right lower field and the left lower field. She has no wheezes. She has rhonchi in the right lower field and the left lower field. She has no rales.  GI: Soft. Bowel sounds are normal. There is no abdominal tenderness.  Musculoskeletal:     Right ankle: She exhibits no swelling.     Left ankle: She exhibits no swelling.  Lymphadenopathy:    She has no cervical adenopathy.  Neurological: She is alert. No cranial nerve deficit.  Skin: Skin is warm. No rash noted. Nails show no clubbing.  Psychiatric: She has a normal mood and affect.      Data Reviewed: Basic Metabolic Panel: Recent Labs  Lab 02/16/19 1642 02/17/19 0441 02/18/19 0343 02/19/19 0356  02/20/19 0529 02/21/19 0951  NA 142 142 146* 145 141  --   K 3.0* 2.7* 2.8* 3.7 3.7  --   CL 109 107 111 107 103  --   CO2 33*  --   GLUCOSE 134* 128* 80 109* 119*  --   BUN 18 20 24* 19 12  --   CREATININE 0.58 0.55 0.39* 0.48 0.40*  --   CALCIUM 8.1* 8.3* 8.8* 8.3* 7.8*  --   MG  --   --   --  1.6*  --  1.5*  PHOS  --   --   --  2.1*  --   --    Liver Function Tests: Recent Labs  Lab 02/16/19 0532 02/20/19 0529 02/21/19 1549  AST 71* 29 33  ALT 33 16 17  ALKPHOS 53 63 68  BILITOT 2.9* 0.9 0.7  PROT 6.2* 5.8* 6.0*  ALBUMIN 2.5* 2.2* 2.3*    Recent Labs  Lab 02/20/19 0529  AMMONIA 55*   CBC: Recent Labs  Lab 02/16/19 0532 02/17/19 0441 02/18/19 0343 02/19/19 0356 02/20/19 0529  WBC 7.7 8.9 4.5 4.9 6.0  HGB 11.0* 11.4* 10.3* 10.1* 10.1*  HCT 35.2* 36.1 33.6* 32.4* 32.4*  MCV 93.6 92.3 94.1 94.5 95.3  PLT 72* 88* 66* 74* 84*    CBG: Recent Labs  Lab 02/15/19 2349 02/16/19  4888 02/16/19 0417 02/16/19 0726 02/17/19 0729  GLUCAP 69* 81 118* 126* 101*    Recent Results (from the past 240 hour(s))  Urine culture     Status: None   Collection Time: 02/15/19  7:20 AM  Result Value Ref Range Status   Specimen Description   Final    URINE, RANDOM Performed at [facility], [address], [place], [place] [address]    Special Requests   Final    NONE Performed at [facility], [address] [place], [place] [address]    Culture   Final    NO GROWTH Performed at [facility], [address] [place], [place] [address]    Report Status 02/16/2019 FINAL  Final  Blood Culture (routine x 2)     Status: Abnormal   Collection Time: 02/15/19  7:21 AM  Result Value Ref Range Status   Specimen Description   Final    BLOOD RIGHT ANTECUBITAL Performed at [facility], [address] [place], [place] [address]    Special Requests   Final    BOTTLES DRAWN AEROBIC AND ANAEROBIC Blood Culture results may not  be optimal due to an excessive volume of blood received in culture bottles Performed at [facility], [address] [place], [place] [address]    Culture  Setup Time   Final    GRAM POSITIVE COCCI IN PAIRS AND CHAINS IN BOTH AEROBIC AND ANAEROBIC BOTTLES CRITICAL RESULT CALLED TO, READ BACK BY AND VERIFIED WITH: DAVID BESANTI 02/15/19 @ 2224  MLK Performed at [facility], [address] [place], [place] [address]    Culture STREPTOCOCCUS PNEUMONIAE (A)  Final   Report Status 02/18/2019 FINAL  Final   Organism ID, Bacteria STREPTOCOCCUS PNEUMONIAE  Final      Susceptibility   Streptococcus pneumoniae - MIC*    ERYTHROMYCIN >=8 RESISTANT Resistant     LEVOFLOXACIN 1 SENSITIVE Sensitive     VANCOMYCIN 0.5 SENSITIVE Sensitive     PENICILLIN (meningitis) 0.12 RESISTANT Resistant     PENICILLIN (non-meningitis) 0.12 SENSITIVE Sensitive     CEFTRIAXONE (non-meningitis) 0.25 SENSITIVE Sensitive     CEFTRIAXONE (meningitis) 0.25 SENSITIVE Sensitive     * STREPTOCOCCUS PNEUMONIAE  Blood Culture (routine x 2)     Status: Abnormal   Collection Time: 02/15/19  7:21 AM  Result Value Ref Range Status   Specimen Description   Final    BLOOD BLOOD RIGHT FOREARM Performed at [facility], [address], [place], [place] [address]    Special Requests   Final    BOTTLES DRAWN AEROBIC AND ANAEROBIC Blood Culture results may not be optimal due to an excessive volume of blood received in culture bottles Performed at [facility], [address] [place], [place] [address]    Culture  Setup Time   Final    Organism ID to follow GRAM POSITIVE COCCI IN PAIRS AND CHAINS IN BOTH AEROBIC AND ANAEROBIC BOTTLES CRITICAL RESULT CALLED TO, READ BACK BY AND VERIFIED WITH: DAVID BASANTI 02/15/19 @ 2224  MLK Performed at [facility], [address] [place], [place] [address]    Culture (A)  Final    STREPTOCOCCUS PNEUMONIAE SUSCEPTIBILITIES PERFORMED ON  PREVIOUS CULTURE WITHIN THE LAST 5 DAYS. Performed at [facility], [address] [place], [place] [address]    Report Status 02/18/2019 FINAL  Final  Blood Culture ID Panel (Reflexed)     Status:  Abnormal   Collection Time: 02/15/19  7:21 AM  Result Value Ref Range Status   Enterococcus species NOT DETECTED NOT DETECTED Final   Listeria monocytogenes NOT DETECTED NOT DETECTED Final   Staphylococcus species NOT DETECTED NOT DETECTED Final   Staphylococcus aureus (BCID) NOT DETECTED NOT DETECTED Final   Streptococcus species DETECTED (A) NOT DETECTED Final    Comment: CRITICAL RESULT CALLED TO, READ BACK BY AND VERIFIED WITH: DAVID BESANTI 02/15/19 @ 2224  MLK    Streptococcus agalactiae NOT DETECTED NOT DETECTED Final   Streptococcus pneumoniae DETECTED (A) NOT DETECTED Final    Comment: CRITICAL RESULT CALLED TO, READ BACK BY AND VERIFIED WITH: DAVID BESANTI 02/15/19 @ 2224  MLK    Streptococcus pyogenes NOT DETECTED NOT DETECTED Final   Acinetobacter baumannii NOT DETECTED NOT DETECTED Final   Enterobacteriaceae species NOT DETECTED NOT DETECTED Final   Enterobacter cloacae complex NOT DETECTED NOT DETECTED Final   Escherichia coli NOT DETECTED NOT DETECTED Final   Klebsiella oxytoca NOT DETECTED NOT DETECTED Final   Klebsiella pneumoniae NOT DETECTED NOT DETECTED Final   Proteus species NOT DETECTED NOT DETECTED Final   Serratia marcescens NOT DETECTED NOT DETECTED Final   Haemophilus influenzae NOT DETECTED NOT DETECTED Final   Neisseria meningitidis NOT DETECTED NOT DETECTED Final   Pseudomonas aeruginosa NOT DETECTED NOT DETECTED Final   Candida albicans NOT DETECTED NOT DETECTED Final   Candida glabrata NOT DETECTED NOT DETECTED Final   Candida krusei NOT DETECTED NOT DETECTED Final   Candida parapsilosis NOT DETECTED NOT DETECTED Final   Candida tropicalis NOT DETECTED NOT DETECTED Final    Comment: Performed at [facility], [address]  [place], [place] [address]  MRSA PCR Screening     Status: None   Collection Time: 02/15/19 10:24 AM  Result Value Ref Range Status   MRSA by PCR NEGATIVE NEGATIVE Final    Comment:        The GeneXpert MRSA Assay (FDA approved for NASAL specimens only), is one component of a comprehensive MRSA colonization surveillance program. It is not intended to diagnose MRSA infection nor to guide or monitor treatment for MRSA infections. Performed at [facility], [address] [place], [place] [address]   Culture, respiratory (non-expectorated)     Status: None   Collection Time: 02/15/19 10:30 AM  Result Value Ref Range Status   Specimen Description   Final    TRACHEAL ASPIRATE Performed at [facility], [address] [place], [place] [address]    Special Requests   Final    NONE Performed at [facility], [address] [place], [place] [address]    Gram Stain   Final    ABUNDANT WBC PRESENT,BOTH PMN AND MONONUCLEAR FEW GRAM POSITIVE COCCI RARE GRAM POSITIVE RODS Performed at [facility], [address] [place], [place] [address]    Culture ABUNDANT STREPTOCOCCUS PNEUMONIAE  Final   Report Status 02/18/2019 FINAL  Final   Organism ID, Bacteria STREPTOCOCCUS PNEUMONIAE  Final      Susceptibility   Streptococcus pneumoniae - MIC*    ERYTHROMYCIN >=8 RESISTANT Resistant     LEVOFLOXACIN 1 SENSITIVE Sensitive     VANCOMYCIN 0.5 SENSITIVE Sensitive     PENO - penicillin 0.12      PENICILLIN (oral) 0.12 INTERMEDIATE Intermediate     CEFTRIAXONE (non-meningitis) 0.25 SENSITIVE Sensitive     * ABUNDANT STREPTOCOCCUS PNEUMONIAE      Scheduled Meds: . buprenorphine-naloxone  1 tablet Sublingual QHS  . Chlorhexidine Gluconate Cloth  6 each Topical Daily  . folic acid  1 mg Oral Daily  . lactulose  20 g Oral Daily  . LORazepam  0-4 mg Intravenous Q6H   Followed by  . [START ON 02/23/2019] LORazepam  0-4 mg Intravenous Q12H  . mouth rinse  15  mL Mouth Rinse BID  . multivitamin with minerals  1 tablet Oral Daily  . sodium chloride flush  10-40 mL Intracatheter Q12H  . thiamine  100 mg Oral Daily   Or  . thiamine  100 mg Intravenous Daily   Continuous Infusions: . lactated ringers 50 mL/hr (02/22/19 0119)    Assessment/Plan:  1. Acute delirium secondary to sepsis.  Haldol as needed.  I doubt alcohol withdrawal even though put on CIWA protocol.  Ammonia level slightly high, prescribed lactulose. 2. Sepsis/pneumonia with Streptococcus pneumoniae.  Continue IV Rocephin 3. Acute on chronic hypoxic respiratory failure.  Patient chronically wears 3 L of oxygen.   4. COPD exacerbation.  Steroids stopped.  Continue nebulizer treatments 5. Chronic thrombocytopenia likely secondary to hepatitis C and cirrhosis of the liver seen on prior imaging  Code Status:     Code Status Orders  (From admission, onward)         Start     Ordered   02/15/19 1040  Full code  Continuous     02/15/19 1039        Code Status History    Date Active Date Inactive Code Status Order ID Comments User Context   10/24/2018 0816 11/07/2018 1925 Full Code [id number]  Arnaldo Natal, MD Inpatient     Family Communication: Husband yesterday Disposition Plan: To be determined  Consultants:  Critical care specialist  Antibiotics:  IV Rocephin  Time spent: 27 minutes.  Case discussed with critical care specialist.  Alford Highland  [facility]

## 2019-02-22 NOTE — Progress Notes (Signed)
CRITICAL CARE NOTE       SUBJECTIVE FINDINGS & SIGNIFICANT EVENTS   Patient is clinically improved and is able to answer questions without encouragement. Agitation improved, does not appear to be in withdrawal from drugs or alcohol  PAST MEDICAL HISTORY   Past Medical History:  Diagnosis Date  . COPD (chronic obstructive pulmonary disease) (HCC)   . Hepatitis C   . Hypertension      SURGICAL HISTORY   Past Surgical History:  Procedure Laterality Date  . CHOLECYSTECTOMY    . prolapsed rectum       FAMILY HISTORY   Family History  Problem Relation Age of Onset  . Cancer Mother      SOCIAL HISTORY   Social History   Tobacco Use  . Smoking status: Current Every Day Smoker    Packs/day: 1.00    Years: 44.00    Pack years: 44.00    Types: Cigarettes  . Smokeless tobacco: Never Used  Substance Use Topics  . Alcohol use: No  . Drug use: Yes    Types: Cocaine    Comment: valium     MEDICATIONS   Current Medication:  Current Facility-Administered Medications:  .  acetaminophen (TYLENOL) tablet 650 mg, 650 mg, Oral, Q6H PRN, 650 mg at 02/21/19 1202 **OR** acetaminophen (TYLENOL) suppository 650 mg, 650 mg, Rectal, Q6H PRN, Merwyn Katos, MD .  buprenorphine-naloxone (SUBOXONE) 2-0.5 mg per SL tablet 1 tablet, 1 tablet, Sublingual, QHS, Merwyn Katos, MD, 1 tablet at 02/21/19 2128 .  Chlorhexidine Gluconate Cloth 2 % PADS 6 each, 6 each, Topical, Daily, Judithe Modest, NP, 6 each at 02/21/19 2118 .  folic acid (FOLVITE) tablet 1 mg, 1 mg, Oral, Daily, Rayola Everhart, MD, 1 mg at 02/21/19 1202 .  haloperidol lactate (HALDOL) injection 1 mg, 1 mg, Intravenous, Q6H PRN, Alford Highland, MD, 1 mg at 02/21/19 2128 .  hydrALAZINE (APRESOLINE) injection 10-20 mg, 10-20 mg, Intravenous,  Q4H PRN, Merwyn Katos, MD .  ipratropium-albuterol (DUONEB) 0.5-2.5 (3) MG/3ML nebulizer solution 3 mL, 3 mL, Nebulization, Q4H PRN, Merwyn Katos, MD, 3 mL at 02/21/19 0022 .  lactated ringers infusion, , Intravenous, Continuous, Merwyn Katos, MD, Last Rate: 50 mL/hr at 02/22/19 0119, 50 mL/hr at 02/22/19 0119 .  LORazepam (ATIVAN) injection 0-4 mg, 0-4 mg, Intravenous, Q6H, 2 mg at 02/22/19 0514 **FOLLOWED BY** [START ON 02/23/2019] LORazepam (ATIVAN) injection 0-4 mg, 0-4 mg, Intravenous, Q12H, Cylas Falzone, MD .  LORazepam (ATIVAN) tablet 1 mg, 1 mg, Oral, Q6H PRN **OR** LORazepam (ATIVAN) injection 1 mg, 1 mg, Intravenous, Q6H PRN, Vida Rigger, MD .  MEDLINE mouth rinse, 15 mL, Mouth Rinse, BID, Merwyn Katos, MD, 15 mL at 02/21/19 2128 .  metoprolol tartrate (LOPRESSOR) injection 2.5-5 mg, 2.5-5 mg, Intravenous, Q3H PRN, Merwyn Katos, MD, 2.5 mg at 02/20/19 2112 .  multivitamin with minerals tablet 1 tablet, 1 tablet, Oral, Daily, Vida Rigger, MD, 1 tablet at 02/21/19 1202 .  [DISCONTINUED] ondansetron (ZOFRAN) tablet 4 mg, 4 mg, Oral, Q6H PRN **OR** ondansetron (ZOFRAN) injection 4 mg, 4 mg, Intravenous, Q6H PRN, Houston Siren, MD, 4 mg at 02/21/19 0005 .  sodium chloride flush (NS) 0.9 % injection 10-40 mL, 10-40 mL, Intracatheter, Q12H, Harlon Ditty D, NP, 10 mL at 02/21/19 2118 .  sodium chloride flush (NS) 0.9 % injection 10-40 mL, 10-40 mL, Intracatheter, PRN, Harlon Ditty D, NP .  thiamine (VITAMIN B-1) tablet 100 mg, 100 mg, Oral, Daily,  100 mg at 02/21/19 1202 **OR** thiamine (B-1) injection 100 mg, 100 mg, Intravenous, Daily, Vida Rigger, MD    ALLERGIES   Patient has no known allergies.    REVIEW OF SYSTEMS     10 system ROS conducted and is negative except as per subjective findings  PHYSICAL EXAMINATION   Vitals:   02/22/19 0100 02/22/19 0500  BP:  117/71  Pulse:    Resp:    Temp: 37.3 C   SpO2:      GENERAL: Mild  distress due to acute illness HEAD: Normocephalic, atraumatic.  EYES: Pupils equal, round, reactive to light.  No scleral icterus.   MOUTH: Moist mucosal membrane. NECK: Supple. No thyromegaly. No nodules. No JVD.  PULMONARY: Decreased breath sounds bilaterally, mild rhonchorous breath sounds no wheezing or crackles CARDIOVASCULAR: S1 and S2. Regular rate and rhythm. No murmurs, rubs, or gallops.  GASTROINTESTINAL: Soft, nontender, non-distended. No masses. Positive bowel sounds. No hepatosplenomegaly.  MUSCULOSKELETAL: No swelling, clubbing, or edema.  NEUROLOGIC: Mild distress due to acute illness SKIN:intact,warm,dry   LABS AND IMAGING     -I personally reviewed most recent blood work, imaging and microbiology - significant findings today are anemia and thrombocytopenia.  LAB RESULTS: Recent Labs  Lab 02/18/19 0343 02/19/19 0356 02/20/19 0529  NA 146* 145 141  K 2.8* 3.7 3.7  CL 111 107 103  CO2 29 31 33*  BUN 24* 19 12  CREATININE 0.39* 0.48 0.40*  GLUCOSE 80 109* 119*   Recent Labs  Lab 02/18/19 0343 02/19/19 0356 02/20/19 0529  HGB 10.3* 10.1* 10.1*  HCT 33.6* 32.4* 32.4*  WBC 4.5 4.9 6.0  PLT 66* 74* 84*     IMAGING RESULTS: No results found.    ASSESSMENT AND PLAN      -Multidisciplinary rounds held today  Acute Hypoxic Respiratory Failure -Due to strep pneumo pneumonia as well as streptococcal pneumoniae bacteremia -continue Bronchodilator Therapy -Continue current antibiotics   CARDIAC FAILURE- -circulatory shock likely due to sepsis-improved currently hemodynamically stable -oxygen as needed ICU monitoring   Thrombocytopenia    - recent decrease by >50% on background of chronic     -HCV antibody +, sepsis induced acute drop , no thrombotoxic meds, fibrinogen wnl   NEUROLOGY -Mentation improved, patient is agitated and confused at times with moaning in between -Possible drug withdrawal syndrome/unknown EtOH use-we will place on  CIWA   Septic shock- resolved      Currently hemodynamically stable -follow up cultures -emperic ABX -consider stress dose steroids   ID -continue IV abx as prescibed -follow up cultures  GI/Nutrition GI PROPHYLAXIS as indicated DIET-->TF's as tolerated Constipation protocol as indicated  ENDO - ICU hypoglycemic\Hyperglycemia protocol -check FSBS per protocol   ELECTROLYTES -follow labs as needed -replace as needed -pharmacy consultation   DVT/GI PRX ordered -SCDs  TRANSFUSIONS AS NEEDED MONITOR FSBS ASSESS the need for LABS as needed   Critical care provider statement:    Critical care time (minutes):  35   Critical care time was exclusive of:  Separately billable procedures and treating other patients   Critical care was necessary to treat or prevent imminent or life-threatening deterioration of the following conditions:   Acute hypoxemic respiratory failure, strep pneumo pneumonia, strep pneumo bacteremia, multiple comorbid conditions   Critical care was time spent personally by me on the following activities:  Development of treatment plan with patient or surrogate, discussions with consultants, evaluation of patient's response to treatment, examination of patient, obtaining history from patient or  surrogate, ordering and performing treatments and interventions, ordering and review of laboratory studies and re-evaluation of patient's condition.  I assumed direction of critical care for this patient from another provider in my specialty: no    This document was prepared using Dragon voice recognition software and may include unintentional dictation errors.    Ottie Glazier, M.D.  Division of [place]

## 2019-02-23 DIAGNOSIS — E43 Unspecified severe protein-calorie malnutrition: Secondary | ICD-10-CM

## 2019-02-23 LAB — COMPREHENSIVE METABOLIC PANEL
ALT: 14 U/L (ref 0–44)
AST: 28 U/L (ref 15–41)
Albumin: 2.2 g/dL — ABNORMAL LOW (ref 3.5–5.0)
Alkaline Phosphatase: 68 U/L (ref 38–126)
Anion gap: 4 — ABNORMAL LOW (ref 5–15)
BUN: 5 mg/dL — ABNORMAL LOW (ref 6–20)
CO2: 33 mmol/L — ABNORMAL HIGH (ref 22–32)
Calcium: 7.8 mg/dL — ABNORMAL LOW (ref 8.9–10.3)
Chloride: 100 mmol/L (ref 98–111)
Creatinine, Ser: 0.33 mg/dL — ABNORMAL LOW (ref 0.44–1.00)
GFR calc non Af Amer: >60 mL/min (ref >60)
Glucose, Bld: 90 mg/dL (ref 70–99)
Potassium: 4.1 mmol/L (ref 3.5–5.1)
Sodium: 137 mmol/L (ref 135–145)
Total Bilirubin: 0.7 mg/dL (ref 0.3–1.2)
Total Protein: 6.6 g/dL (ref 6.5–8.1)

## 2019-02-23 LAB — CBC
HCT: 31.4 % — ABNORMAL LOW (ref 36.0–46.0)
Hemoglobin: 9.5 g/dL — ABNORMAL LOW (ref 12.0–15.0)
MCH: 29.5 pg (ref 26.0–34.0)
MCHC: 30.3 g/dL (ref 30.0–36.0)
MCV: 97.5 fL (ref 80.0–100.0)
Platelets: 109 10*3/uL — ABNORMAL LOW (ref 150–400)
RBC: 3.22 MIL/uL — ABNORMAL LOW (ref 3.87–5.11)
RDW: 18 % — ABNORMAL HIGH (ref 11.5–15.5)
WBC: 5.1 10*3/uL (ref 4.0–10.5)
nRBC: 0 % (ref 0.0–0.2)

## 2019-02-23 LAB — MAGNESIUM: Magnesium: 1.7 mg/dL (ref 1.7–2.4)

## 2019-02-23 LAB — AMMONIA: Ammonia: 35 umol/L (ref 9–35)

## 2019-02-23 MED ORDER — MAGNESIUM OXIDE 400 (241.3 MG) MG PO TABS
400.0000 mg | ORAL_TABLET | ORAL | Status: AC
  Administered 2019-02-23 (×2): 400 mg via ORAL
  Filled 2019-02-23 (×2): qty 1

## 2019-02-23 NOTE — Progress Notes (Addendum)
[facility] - [place] [facility]   PATIENT NAME: Hanifa Stillson    MR#:  [id number]  DATE OF BIRTH:  July 27, 1956  SUBJECTIVE:  CHIEF COMPLAINT:   Chief Complaint  Patient presents with  . Altered Mental Status  Patient only complains of being warm, denies pain, complains of severe generalized weakness, dietary input appreciated given severe malnutrition status, physical therapy to see, still with high O2 requirement-we will wean to baseline requirement of 2.5-3 L via nasal cannula  REVIEW OF SYSTEMS:  CONSTITUTIONAL: No fever, fatigue or weakness.  EYES: No blurred or double vision.  EARS, NOSE, AND THROAT: No tinnitus or ear pain.  RESPIRATORY: No cough, shortness of breath, wheezing or hemoptysis.  CARDIOVASCULAR: No chest pain, orthopnea, edema.  GASTROINTESTINAL: No nausea, vomiting, diarrhea or abdominal pain.  GENITOURINARY: No dysuria, hematuria.  ENDOCRINE: No polyuria, nocturia,  HEMATOLOGY: No anemia, easy bruising or bleeding SKIN: No rash or lesion. MUSCULOSKELETAL: No joint pain or arthritis.   NEUROLOGIC: No tingling, numbness, weakness.  PSYCHIATRY: No anxiety or depression.   ROS  DRUG ALLERGIES:  No Known Allergies  VITALS:  Blood pressure 122/73, pulse 98, temperature 98.7 F (37.1 C), temperature source Oral, resp. rate 18, height 5' (1.524 m), weight 62.8 kg, SpO2 94 %.  PHYSICAL EXAMINATION:  GENERAL:  63 y.o.-year-old patient lying in the bed with no acute distress.  EYES: Pupils equal, round, reactive to light and accommodation. No scleral icterus. Extraocular muscles intact.  HEENT: Head atraumatic, normocephalic. Oropharynx and nasopharynx clear.  NECK:  Supple, no jugular venous distention. No thyroid enlargement, no tenderness.  LUNGS: Normal breath sounds bilaterally, no wheezing, rales,rhonchi or crepitation. No use of accessory muscles of respiration.  CARDIOVASCULAR: S1, S2 normal. No murmurs, rubs, or gallops.  ABDOMEN:  Soft, nontender, nondistended. Bowel sounds present. No organomegaly or mass.  EXTREMITIES: No pedal edema, cyanosis, or clubbing.  NEUROLOGIC: Cranial nerves II through XII are intact. Muscle strength 5/5 in all extremities. Sensation intact. Gait not checked.  PSYCHIATRIC: The patient is alert and oriented x 3.  SKIN: No obvious rash, lesion, or ulcer.   Physical Exam LABORATORY PANEL:   CBC Recent Labs  Lab 02/23/19 0600  WBC 5.1  HGB 9.5*  HCT 31.4*  PLT 109*   ------------------------------------------------------------------------------------------------------------------  Chemistries  Recent Labs  Lab 02/23/19 0600  NA 137  K 4.1  CL 100  CO2 33*  GLUCOSE 90  BUN 5*  CREATININE 0.33*  CALCIUM 7.8*  MG 1.7  AST 28  ALT 14  ALKPHOS 68  BILITOT 0.7   ------------------------------------------------------------------------------------------------------------------  Cardiac Enzymes No results for input(s): TROPONINI in the last 168 hours. ------------------------------------------------------------------------------------------------------------------  RADIOLOGY:  No results found.  ASSESSMENT AND PLAN:  *Acute delirium secondary to sepsis Resolving, appears more awake, alert, interactive, answering questions appropriately Continue increased nursing care PRN, neurochecks per routine, Haldol as needed, ammonia level slightly elevated-treated with lactulose, doubtful due to alcohol withdrawal-but will maintain CIWA protocol  *Acute sepsis Secondary to streptococcal pneumonia Continue IV Rocephin for 5-day course  *Acute on chronic hypoxic respiratory failure Continue supplemental oxygen with weaning as tolerated, currently on 5 L, on 3 L typically at home  *Acute on COPD exacerbation Weaned off steroids, continue breathing treatments PRN  *Chronic liver cirrhosis, history of hep C with associated thrombocytopenia Stable  *Chronic severe  malnutrition Dietary input greatly appreciated, continue dysphagia 1 diet with nectar thickened liquids, aspiration precautions, physical therapy to see, palliative care consulted  All the records are  reviewed and case discussed with Care Management/Social Workerr. Management plans discussed with the patient, family and they are in agreement.  CODE STATUS: full  TOTAL TIME TAKING CARE OF THIS PATIENT: 35 minutes.   POSSIBLE D/C IN 1-3 DAYS, DEPENDING ON CLINICAL CONDITION.   Evelena Asa Salary M.D on 02/23/2019   Between 7am to 6pm - Pager - [phone]  After 6pm go to [email or web address] - Social research officer, government  [facility]  Office  [phone]  CC: Primary care physician; Donaciano Eva, FNP  Note: This dictation was prepared with Dragon dictation along with smaller phrase technology. Any transcriptional errors that result from this process are unintentional.

## 2019-02-24 MED ORDER — LACTULOSE 10 GM/15ML PO SOLN: 20.0000 g | Freq: Every day | ORAL | 3 refills | Status: DC

## 2019-02-24 MED ORDER — FOLIC ACID 1 MG PO TABS: 1.0000 mg | ORAL_TABLET | Freq: Every day | ORAL | 0 refills | Status: DC

## 2019-02-24 MED ORDER — THIAMINE HCL 100 MG PO TABS: 100.0000 mg | ORAL_TABLET | Freq: Every day | ORAL | 0 refills | Status: DC

## 2019-02-24 MED ORDER — ADULT MULTIVITAMIN W/MINERALS CH: 1.0000 | ORAL_TABLET | Freq: Every day | ORAL | 0 refills | Status: DC

## 2019-02-24 MED ORDER — CEFDINIR 300 MG PO CAPS: 300.0000 mg | ORAL_CAPSULE | Freq: Two times a day (BID) | ORAL | 0 refills | Status: DC

## 2019-02-24 MED ORDER — ENSURE COMPLETE SHAKE PO LIQD: 1.0000 | Freq: Three times a day (TID) | ORAL | 3 refills | Status: DC

## 2019-02-24 NOTE — Plan of Care (Signed)

## 2019-02-24 NOTE — Progress Notes (Signed)
  Speech Language Pathology Treatment: Dysphagia  Patient Details Name: Kylie Monroe MRN: [id number] DOB: 02/15/1956 Today's Date: 02/24/2019 Time: 3875-6433 SLP Time Calculation (min) (ACUTE ONLY): 27 min  Assessment / Plan / Recommendation Clinical Impression  Pt was minimally agreeable to trials of thin liquid. Pt presented w/positive s/s aspiration c/b prolonged delayed coughing following 3 single sips thin liquid. Pt refused trials of nectar thick liquid as she stated she already ate breakfast. Attempted to educate patient on rationale for current diet and safe swallow rec's; however pt responded with unrelated remarks, and moaning saying she was so cold. SLP retrieved heated blanket and laid over pt. Pt stated "thank you, it feels so good."   However pt continues to remain confused, repeatedly requesting "where is the lady who is going to do my hair." When asked if she'd like nursing staff to assist with her hair (and grooming), she loudly said "No, she told me she was coming to help me with my hair." and largely perseverated on this topic throughout treatment. Recommend continue with current Dysphagia I (puree) diet with Nectar thick liquids. Pt's confusion and decreased mental status remain barriers in implementation of compensatory strategies and safe diet advancement. Discussed current rec's with nursing. Nsg in agreement. Will continue to f/u with toleration of diet and offer trials of upgraded consistency as appropriate.   HPI HPI: Pt is a 63 y.o. female with a known history of COPD, hepatitis C, history of heroin abuse on Suboxone program, hypertension who presented to the hospital due to altered mental status and hypoxia.  Patient is presently intubated and sedated therefore most of the history obtained from the chart and from the ER physician.  Patient's boyfriend apparently found her unresponsive this morning and therefore called EMS.  EMS on arrival noted that the patient was  significantly hypoxic with O2 sats in the high 70s to low 80s.  Patient was urgently brought to the ER noted to be in acute respiratory failure with hypoxia.  Patient's chest x-ray findings are suggestive of left-sided pneumonia and patient was started on broad-spectrum antibiotics and given IV fluids.  While in the ER patient became quite agitated and would not follow any commands and was very restless and therefore was intubated for airway protection and agitation.  Pt was extubated; remains confused.      SLP Plan  Continue with current plan of care       Recommendations  Diet recommendations: Dysphagia 1 (puree);Nectar-thick liquid Liquids provided via: Cup;Straw Medication Administration: Crushed with puree Supervision: Staff to assist with self feeding Compensations: Minimize environmental distractions;Slow rate;Small sips/bites Postural Changes and/or Swallow Maneuvers: Seated upright 90 degrees                Oral Care Recommendations: Oral care before and after PO Follow up Recommendations: Skilled Nursing facility SLP Visit Diagnosis: Dysphagia, oropharyngeal phase (R13.12) Plan: Continue with current plan of care       GO                Yamilette Garretson, MA, CCC-SLP 02/24/2019, 10:40 AM

## 2019-02-24 NOTE — TOC Transition Note (Signed)
Transition of Care [facility]) - CM/SW Discharge Note   Patient Details  Name: Kylie Monroe MRN: [id number] Date of Birth: 1956-10-20  [facility] CM/SW Contact:  Gwenette Greet, RN Phone Number: 02/24/2019, 1:26 PM   Clinical Narrative: Discharge to home today per Dr. Katheren Shams.  Sees Dr. Margarita Grizzle. Chronic home oxygen 3 liters. Palliative Care consult completed. Kylie Monroe indicated that her boyfriend will transport. Discussed that friend would need to bring oxygen tank from home.     Final next level of care: Home w Home Health Services Barriers to Discharge: Barriers Resolved   Patient Goals and CMS Choice Patient states their goals for this hospitalization and ongoing recovery are:: (Go home with boyfriend of 33 years) CMS Medicare.gov Compare Post Acute Care list provided to:: Patient Choice offered to / list presented to : Patient [facility]. [facility] unable to take patient [facility]. Feliberto Gottron, [facility] representative upated. [facility] is able to accept Kylie Monroe.   Discharge Placement Home                        Discharge Plan and Services   Discharge Planning Services: CM Consult  Physical therapy, Skilled Nursing, Social Worker, Aide                      Social Determinants of Health (SDOH) Interventions Lives with boyfriend     Readmission Risk Interventions No flowsheet data found.

## 2019-02-24 NOTE — Evaluation (Signed)
Physical Therapy Evaluation Patient Details Name: Kylie Monroe MRN: [id number] DOB: 1956-06-02 Today's Date: 02/24/2019   History of Present Illness  63 year old female with past medical history of substance abuse, hypertension, hepatitis C, COPD with ongoing tobacco abuse who presented to the hospital due to altered mental status and hypoxia.  Admitted with sepsis/flu.  Clinical Impression  Pt impulsive t/o the session, but was able to do some in-room mobility/ambulation.  Pt did not follow instructions very well with walker use and manipulation and did not use it appropriate t/o most of the effort, but also did not have any overt LOBs.  She was on 3 liters t/o the session with O2 mid 80s to low 90s. Pt struggled to get up to standing from low surfaces on both attempts, some improvement with cuing but pt tended to do her own thing.  Pt states that boyfriend is able to be around nearly 24/7 to assist.  She feels able to go home and did well enough where this PT is satisfied that she does not need to STR.      Follow Up Recommendations Home health PT    Equipment Recommendations  Rolling walker with 5" wheels    Recommendations for Other Services       Precautions / Restrictions Precautions Precautions: Fall Restrictions Weight Bearing Restrictions: No      Mobility  Bed Mobility Overal bed mobility: Modified Independent             General bed mobility comments: Pt slow and labored to get to EOB but able to do so w/o physical assist  Transfers Overall transfer level: Needs assistance Equipment used: Rolling walker (2 wheeled) Transfers: Sit to/from Stand Sit to Stand: Min assist         General transfer comment: Pt initially trying to get up to standing holding both hands on walker, unable to rise.  Cues to use UEs on bed and with heavy effort and min assist she was able to rise  Ambulation/Gait Ambulation/Gait assistance: Min guard Gait Distance (Feet): 25  Feet Assistive device: Rolling walker (2 wheeled)       General Gait Details: Pt with very stooped and occasionally impulsive gait.  Pt did not have any LOBs needing only directional assist/cuing for walker manipulation and general awareness.  Stairs            Wheelchair Mobility    Modified Rankin (Stroke Patients Only)       Balance Overall balance assessment: Needs assistance Sitting-balance support: No upper extremity supported Sitting balance-Leahy Scale: Good     Standing balance support: Bilateral upper extremity supported Standing balance-Leahy Scale: Fair Standing balance comment: Pt leaning forward on walker heavily, but did not have any LOBs                             Pertinent Vitals/Pain Pain Assessment: 0-10 Pain Score: 5  Pain Location: baseline chronic pain    Home Living Family/patient expects to be discharged to:: Private residence Living Arrangements: Spouse/significant other Available Help at Discharge: Family Type of Home: Mobile home Home Access: Stairs to enter Entrance Stairs-Rails: Right Entrance Stairs-Number of Steps: 5 Home Layout: One level Home Equipment: Cane - single point;Walker - 2 wheels;Shower seat      Prior Function Level of Independence: Independent with assistive device(s)         Comments: Pt reports that she does get out of the house with  some regularity, less so recently     Hand Dominance        Extremity/Trunk Assessment   Upper Extremity Assessment Upper Extremity Assessment: Generalized weakness    Lower Extremity Assessment Lower Extremity Assessment: Generalized weakness       Communication   Communication: (soft, slurred voice t/o much of session)  Cognition Arousal/Alertness: Awake/alert Behavior During Therapy: WFL for tasks assessed/performed Overall Cognitive Status: Within Functional Limits for tasks assessed                                         General Comments      Exercises     Assessment/Plan    PT Assessment Patient needs continued PT services  PT Problem List Decreased strength;Decreased range of motion;Decreased activity tolerance;Decreased balance;Decreased mobility;Decreased safety awareness;Decreased knowledge of precautions;Cardiopulmonary status limiting activity;Pain;Decreased knowledge of use of DME       PT Treatment Interventions Gait training;DME instruction;Functional mobility training;Therapeutic activities;Balance training;Neuromuscular re-education;Therapeutic exercise;Patient/family education    PT Goals (Current goals can be found in the Care Plan section)  Acute Rehab PT Goals Patient Stated Goal: go home PT Goal Formulation: With patient Time For Goal Achievement: 03/10/19 Potential to Achieve Goals: Fair    Frequency Min 2X/week   Barriers to discharge        Co-evaluation               AM-PAC PT "6 Clicks" Mobility  Outcome Measure Help needed turning from your back to your side while in a flat bed without using bedrails?: None Help needed moving from lying on your back to sitting on the side of a flat bed without using bedrails?: None Help needed moving to and from a bed to a chair (including a wheelchair)?: None Help needed standing up from a chair using your arms (e.g., wheelchair or bedside chair)?: A Little Help needed to walk in hospital room?: A Little Help needed climbing 3-5 steps with a railing? : A Little 6 Click Score: 21    End of Session Equipment Utilized During Treatment: Gait belt;Oxygen Activity Tolerance: Patient limited by fatigue Patient left: with chair alarm set;with call bell/phone within reach Nurse Communication: Mobility status PT Visit Diagnosis: Muscle weakness (generalized) (M62.81);Difficulty in walking, not elsewhere classified (R26.2)    Time: 1005-1036 PT Time Calculation (min) (ACUTE ONLY): 31 min   Charges:   PT Evaluation $PT Eval Low  Complexity: 1 Low PT Treatments $Therapeutic Activity: 8-22 mins        Malachi Pro, DPT 02/24/2019, 12:10 PM

## 2019-02-24 NOTE — Progress Notes (Signed)
Patient awake most of the night and Nursing staff has spent a considerable amount of time in patient's room. Patient complained of a headache and Tylenol given per PRN order. Patient's bed was wet partially due to PIV had infiltrated and also due to incontinency of urine. Linen changed and partial bath given. RN able to insert PIV in the right forearm but will need IV team to reassess for patient was very difficult to get a stick. Currently patient is resting. Will continue to monitor to end of shift.

## 2019-02-24 NOTE — Progress Notes (Signed)
Pt's niece called and inquired about pt being D/C and needed additional care then the home health 2 days a week. This Clinical research associate informed her that her case worker is gone for the day and pt did not qualify for skilled facility. Educated niece on private duty and about contacting her insurance to see what other options are available for the pt. Niece states she will call insurance and the case worker tomorrow.

## 2019-02-24 NOTE — Discharge Summary (Addendum)
[facility] [place] at [facility]   PATIENT NAME: Kylie Monroe    MR#:  [id number]  DATE OF BIRTH:  1956/04/04  DATE OF ADMISSION:  02/15/2019 ADMITTING PHYSICIAN: Houston Siren, MD  DATE OF DISCHARGE: No discharge date for patient encounter.  PRIMARY CARE PHYSICIAN: Donaciano Eva, FNP    ADMISSION DIAGNOSIS:  Metabolic encephalopathy [G93.41] Healthcare-associated pneumonia [J18.9] Septic shock (HCC) [A41.9, R65.21]  DISCHARGE DIAGNOSIS:  Active Problems:   Acute respiratory failure with hypoxia (HCC)   Protein-calorie malnutrition, severe   SECONDARY DIAGNOSIS:   Past Medical History:  Diagnosis Date  . COPD (chronic obstructive pulmonary disease) (HCC)   . Hepatitis C   . Hypertension     HOSPITAL COURSE:  *Acute delirium secondary to sepsis Resolved Pt was awake, alert, interactive, answering questions appropriately at time of discharge Provided increased nursing care PRN, Haldol as needed, ammonia level slightly elevated-treated with lactulose, doubtful due to alcohol withdrawal-maintained on  CIWA protocol  *Acute sepsis Resolved Secondary to streptococcal pneumonia Treated with Rocephin while in house   *Acute on chronic hypoxic respiratory failure Resolved Successfully weaned back to her O2 requirement of 3 L via nasal cannula continuous   *Acute on COPD exacerbation Weaned off steroids, continue breathing treatments PRN  *Chronic liver cirrhosis, history of hep C with associated thrombocytopenia Stable  *Chronic severe malnutrition Stable Dietary did see patient while in house, speech therapy recommended dysphagia 1 diet with nectar thickened liquids which the patient tolerated, placed on aspiration precautions, physical therapy recommended home health PT status post discharge, palliative care consulted while in house given long-term poor prognosis  Home with palliative care  DISCHARGE CONDITIONS:    stable  CONSULTS OBTAINED:    DRUG ALLERGIES:  No Known Allergies  DISCHARGE MEDICATIONS:   Allergies as of 02/24/2019   No Known Allergies     Medication List    STOP taking these medications   gabapentin 300 MG capsule Commonly known as:  NEURONTIN     TAKE these medications   aspirin 81 MG chewable tablet Chew 81 mg by mouth daily.   cefdinir 300 MG capsule Commonly known as:  OMNICEF Take 1 capsule (300 mg total) by mouth 2 (two) times daily.   cetirizine 10 MG tablet Commonly known as:  ZYRTEC Take 10 mg by mouth daily as needed for itching.   Ensure Complete Shake Liqd Take 1 Can by mouth 4 (four) times daily -  before meals and at bedtime.   fluticasone 220 MCG/ACT inhaler Commonly known as:  FLOVENT HFA Inhale 2 puffs into the lungs 2 (two) times daily.   fluticasone 50 MCG/ACT nasal spray Commonly known as:  FLONASE Place 2 sprays into both nostrils daily.   folic acid 1 MG tablet Commonly known as:  FOLVITE Take 1 tablet (1 mg total) by mouth daily. Start taking on:  February 25, 2019   ipratropium-albuterol 0.5-2.5 (3) MG/3ML Soln Commonly known as:  DUONEB Inhale 3 mLs into the lungs 4 (four) times daily.   lactulose 10 GM/15ML solution Commonly known as:  CHRONULAC Take 30 mLs (20 g total) by mouth daily. Start taking on:  February 25, 2019   LORazepam 0.5 MG tablet Commonly known as:  ATIVAN Take 1 tablet (0.5 mg total) by mouth 2 (two) times daily as needed for anxiety or sleep.   multivitamin with minerals Tabs tablet Take 1 tablet by mouth daily. Start taking on:  February 25, 2019   ProAir HFA 108 (  90 Base) MCG/ACT inhaler Generic drug:  albuterol Inhale 1-2 puffs into the lungs every 4 (four) hours as needed for wheezing or shortness of breath.   Suboxone 8-2 MG Film Generic drug:  Buprenorphine HCl-Naloxone HCl Take 1 Film by mouth 3 (three) times daily.   thiamine 100 MG tablet Take 1 tablet (100 mg total) by mouth  daily. Start taking on:  February 25, 2019   topiramate 25 MG tablet Commonly known as:  TOPAMAX Take 25 mg by mouth daily.        DISCHARGE INSTRUCTIONS:   If you experience worsening of your admission symptoms, develop shortness of breath, life threatening emergency, suicidal or homicidal thoughts you must seek medical attention immediately by calling 911 or calling your MD immediately  if symptoms less severe.  You Must read complete instructions/literature along with all the possible adverse reactions/side effects for all the Medicines you take and that have been prescribed to you. Take any new Medicines after you have completely understood and accept all the possible adverse reactions/side effects.   Please note  You were cared for by a hospitalist during your hospital stay. If you have any questions about your discharge medications or the care you received while you were in the hospital after you are discharged, you can call the unit and asked to speak with the hospitalist on call if the hospitalist that took care of you is not available. Once you are discharged, your primary care physician will handle any further medical issues. Please note that NO REFILLS for any discharge medications will be authorized once you are discharged, as it is imperative that you return to your primary care physician (or establish a relationship with a primary care physician if you do not have one) for your aftercare needs so that they can reassess your need for medications and monitor your lab values.    Today   CHIEF COMPLAINT:   Chief Complaint  Patient presents with  . Altered Mental Status    HISTORY OF PRESENT ILLNESS:   63 y.o. female with a known history of COPD, hepatitis C, history of heroin abuse on Suboxone program, hypertension who presented to the hospital due to altered mental status and hypoxia.  Patient is presently intubated and sedated therefore most of the history obtained from the  chart and from the ER physician.  Patient's boyfriend apparently found her unresponsive this morning and therefore called EMS.  EMS on arrival noted that the patient was significantly hypoxic with O2 sats in the high 70s to low 80s.  Patient was urgently brought to the ER noted to be in acute respiratory failure with hypoxia.  Patient's chest x-ray findings are suggestive of left-sided pneumonia and patient was started on broad-spectrum antibiotics and given IV fluids.  While in the ER patient became quite agitated and would not follow any commands and was very restless and therefore was intubated for airway protection and agitation.  Hospitalist services were contacted for admission.   VITAL SIGNS:  Blood pressure 118/71, pulse 98, temperature 98.7 F (37.1 C), temperature source Oral, resp. rate 19, height 5' (1.524 m), weight 62.8 kg, SpO2 95 %.  I/O:    Intake/Output Summary (Last 24 hours) at 02/24/2019 1107 Last data filed at 02/24/2019 1056 Gross per 24 hour  Intake 1610.46 ml  Output 1950 ml  Net -339.54 ml    PHYSICAL EXAMINATION:  GENERAL:  63 y.o.-year-old patient lying in the bed with no acute distress.  EYES: Pupils equal, round,  reactive to light and accommodation. No scleral icterus. Extraocular muscles intact.  HEENT: Head atraumatic, normocephalic. Oropharynx and nasopharynx clear.  NECK:  Supple, no jugular venous distention. No thyroid enlargement, no tenderness.  LUNGS: Normal breath sounds bilaterally, no wheezing, rales,rhonchi or crepitation. No use of accessory muscles of respiration.  CARDIOVASCULAR: S1, S2 normal. No murmurs, rubs, or gallops.  ABDOMEN: Soft, non-tender, non-distended. Bowel sounds present. No organomegaly or mass.  EXTREMITIES: No pedal edema, cyanosis, or clubbing.  NEUROLOGIC: Cranial nerves II through XII are intact. Muscle strength 5/5 in all extremities. Sensation intact. Gait not checked.  PSYCHIATRIC: The patient is alert and oriented x 3.   SKIN: No obvious rash, lesion, or ulcer.   DATA REVIEW:   CBC Recent Labs  Lab 02/23/19 0600  WBC 5.1  HGB 9.5*  HCT 31.4*  PLT 109*    Chemistries  Recent Labs  Lab 02/23/19 0600  NA 137  K 4.1  CL 100  CO2 33*  GLUCOSE 90  BUN 5*  CREATININE 0.33*  CALCIUM 7.8*  MG 1.7  AST 28  ALT 14  ALKPHOS 68  BILITOT 0.7    Cardiac Enzymes No results for input(s): TROPONINI in the last 168 hours.  Microbiology Results  Results for orders placed or performed during the hospital encounter of 02/15/19  Urine culture     Status: None   Collection Time: 02/15/19  7:20 AM  Result Value Ref Range Status   Specimen Description   Final    URINE, RANDOM Performed at [facility], [address] [place], [place] [address]    Special Requests   Final    NONE Performed at [facility], [address], [place], [place] [address]    Culture   Final    NO GROWTH Performed at [facility], [address] [place], [place] [address]    Report Status 02/16/2019 FINAL  Final  Blood Culture (routine x 2)     Status: Abnormal   Collection Time: 02/15/19  7:21 AM  Result Value Ref Range Status   Specimen Description   Final    BLOOD RIGHT ANTECUBITAL Performed at [facility], [address] [place], [place] [address]    Special Requests   Final    BOTTLES DRAWN AEROBIC AND ANAEROBIC Blood Culture results may not be optimal due to an excessive volume of blood received in culture bottles Performed at [facility], [address], [place], [place] [address]    Culture  Setup Time   Final    GRAM POSITIVE COCCI IN PAIRS AND CHAINS IN BOTH AEROBIC AND ANAEROBIC BOTTLES CRITICAL RESULT CALLED TO, READ BACK BY AND VERIFIED WITH: DAVID BESANTI 02/15/19 @ 2224  MLK Performed at [facility], [address] [place], [place] [address]    Culture STREPTOCOCCUS PNEUMONIAE (A)  Final   Report Status 02/18/2019 FINAL  Final   Organism  ID, Bacteria STREPTOCOCCUS PNEUMONIAE  Final      Susceptibility   Streptococcus pneumoniae - MIC*    ERYTHROMYCIN >=8 RESISTANT Resistant     LEVOFLOXACIN 1 SENSITIVE Sensitive     VANCOMYCIN 0.5 SENSITIVE Sensitive     PENICILLIN (meningitis) 0.12 RESISTANT Resistant     PENICILLIN (non-meningitis) 0.12 SENSITIVE Sensitive     CEFTRIAXONE (non-meningitis) 0.25 SENSITIVE Sensitive     CEFTRIAXONE (meningitis) 0.25 SENSITIVE Sensitive     * STREPTOCOCCUS PNEUMONIAE  Blood Culture (routine x 2)     Status:  Abnormal   Collection Time: 02/15/19  7:21 AM  Result Value Ref Range Status   Specimen Description   Final    BLOOD BLOOD RIGHT FOREARM Performed at [facility], [address] [place], [place] [address]    Special Requests   Final    BOTTLES DRAWN AEROBIC AND ANAEROBIC Blood Culture results may not be optimal due to an excessive volume of blood received in culture bottles Performed at [facility], [address] [place], [place] [address]    Culture  Setup Time   Final    Organism ID to follow GRAM POSITIVE COCCI IN PAIRS AND CHAINS IN BOTH AEROBIC AND ANAEROBIC BOTTLES CRITICAL RESULT CALLED TO, READ BACK BY AND VERIFIED WITH: DAVID BASANTI 02/15/19 @ 2224  MLK Performed at [facility], [address] [place], [place] [address]    Culture (A)  Final    STREPTOCOCCUS PNEUMONIAE SUSCEPTIBILITIES PERFORMED ON PREVIOUS CULTURE WITHIN THE LAST 5 DAYS. Performed at [facility], [address] [place], [place] [address]    Report Status 02/18/2019 FINAL  Final  Blood Culture ID Panel (Reflexed)     Status: Abnormal   Collection Time: 02/15/19  7:21 AM  Result Value Ref Range Status   Enterococcus species NOT DETECTED NOT DETECTED Final   Listeria monocytogenes NOT DETECTED NOT DETECTED Final   Staphylococcus species NOT DETECTED NOT DETECTED Final   Staphylococcus aureus (BCID) NOT DETECTED NOT DETECTED Final   Streptococcus species  DETECTED (A) NOT DETECTED Final    Comment: CRITICAL RESULT CALLED TO, READ BACK BY AND VERIFIED WITH: DAVID BESANTI 02/15/19 @ 2224  MLK    Streptococcus agalactiae NOT DETECTED NOT DETECTED Final   Streptococcus pneumoniae DETECTED (A) NOT DETECTED Final    Comment: CRITICAL RESULT CALLED TO, READ BACK BY AND VERIFIED WITH: DAVID BESANTI 02/15/19 @ 2224  MLK    Streptococcus pyogenes NOT DETECTED NOT DETECTED Final   Acinetobacter baumannii NOT DETECTED NOT DETECTED Final   Enterobacteriaceae species NOT DETECTED NOT DETECTED Final   Enterobacter cloacae complex NOT DETECTED NOT DETECTED Final   Escherichia coli NOT DETECTED NOT DETECTED Final   Klebsiella oxytoca NOT DETECTED NOT DETECTED Final   Klebsiella pneumoniae NOT DETECTED NOT DETECTED Final   Proteus species NOT DETECTED NOT DETECTED Final   Serratia marcescens NOT DETECTED NOT DETECTED Final   Haemophilus influenzae NOT DETECTED NOT DETECTED Final   Neisseria meningitidis NOT DETECTED NOT DETECTED Final   Pseudomonas aeruginosa NOT DETECTED NOT DETECTED Final   Candida albicans NOT DETECTED NOT DETECTED Final   Candida glabrata NOT DETECTED NOT DETECTED Final   Candida krusei NOT DETECTED NOT DETECTED Final   Candida parapsilosis NOT DETECTED NOT DETECTED Final   Candida tropicalis NOT DETECTED NOT DETECTED Final    Comment: Performed at [facility], [address] [place], [place] [address]  MRSA PCR Screening     Status: None   Collection Time: 02/15/19 10:24 AM  Result Value Ref Range Status   MRSA by PCR NEGATIVE NEGATIVE Final    Comment:        The GeneXpert MRSA Assay (FDA approved for NASAL specimens only), is one component of a comprehensive MRSA colonization surveillance program. It is not intended to diagnose MRSA infection nor to guide or monitor treatment for MRSA infections. Performed at [facility], [address], [place], [place] [address]   Culture, respiratory  (non-expectorated)     Status: None  Collection Time: 02/15/19 10:30 AM  Result Value Ref Range Status   Specimen Description   Final    TRACHEAL ASPIRATE Performed at [facility], [address], [place], [place] [address]    Special Requests   Final    NONE Performed at [facility], [address] [place], [place] [address]    Gram Stain   Final    ABUNDANT WBC PRESENT,BOTH PMN AND MONONUCLEAR FEW GRAM POSITIVE COCCI RARE GRAM POSITIVE RODS Performed at [facility], [address] [place], [place] [address]    Culture ABUNDANT STREPTOCOCCUS PNEUMONIAE  Final   Report Status 02/18/2019 FINAL  Final   Organism ID, Bacteria STREPTOCOCCUS PNEUMONIAE  Final      Susceptibility   Streptococcus pneumoniae - MIC*    ERYTHROMYCIN >=8 RESISTANT Resistant     LEVOFLOXACIN 1 SENSITIVE Sensitive     VANCOMYCIN 0.5 SENSITIVE Sensitive     PENO - penicillin 0.12      PENICILLIN (oral) 0.12 INTERMEDIATE Intermediate     CEFTRIAXONE (non-meningitis) 0.25 SENSITIVE Sensitive     * ABUNDANT STREPTOCOCCUS PNEUMONIAE    RADIOLOGY:  No results found.  EKG:   Orders placed or performed during the hospital encounter of 02/15/19  . ED EKG  . ED EKG  . EKG 12-Lead  . EKG 12-Lead      Management plans discussed with the patient, family and they are in agreement.  CODE STATUS:     Code Status Orders  (From admission, onward)         Start     Ordered   02/15/19 1040  Full code  Continuous     02/15/19 1039        Code Status History    Date Active Date Inactive Code Status Order ID Comments User Context   10/24/2018 0816 11/07/2018 1925 Full Code [id number]  Arnaldo Natal, MD Inpatient      TOTAL TIME TAKING CARE OF THIS PATIENT: 45 minutes.    Evelena Asa Deston Bilyeu M.D on 02/24/2019 at 11:07 AM  Between 7am to 6pm - Pager - [phone]  After 6pm go to [email or web address] - Social research officer, government  [facility]  Office   [phone]  CC: Primary care physician; Donaciano Eva, FNP   Note: This dictation was prepared with Dragon dictation along with smaller phrase technology. Any transcriptional errors that result from this process are unintentional.

## 2019-02-24 NOTE — Progress Notes (Signed)
Reviewed AVS with patient and pt's boyfriend ( of 30+ years). Printed RX's with AVS. Both verbilize understanding. Boyfriend expresses concerns that pt will be back in hospital by tomorrow.  This Clinical research associate reinforced the importance of pt going to her follow up appts. When to call the doctor and when to go the ED. Understanding verbalized. Pt refused a bath and having hair brushed before leaving.

## 2019-02-27 LAB — MISC LABCORP TEST (SEND OUT): Labcorp test code: 139250

## 2020-02-06 ENCOUNTER — Encounter: Payer: Self-pay | Admitting: *Deleted

## 2020-04-08 IMAGING — DX DG CHEST 1V PORT
1 series · 1 of 1 positions shown · non-contrast
Comparison: None.

CLINICAL DATA: Endotracheal tube placement

EXAM:
PORTABLE CHEST 1 VIEW

[chest ap]
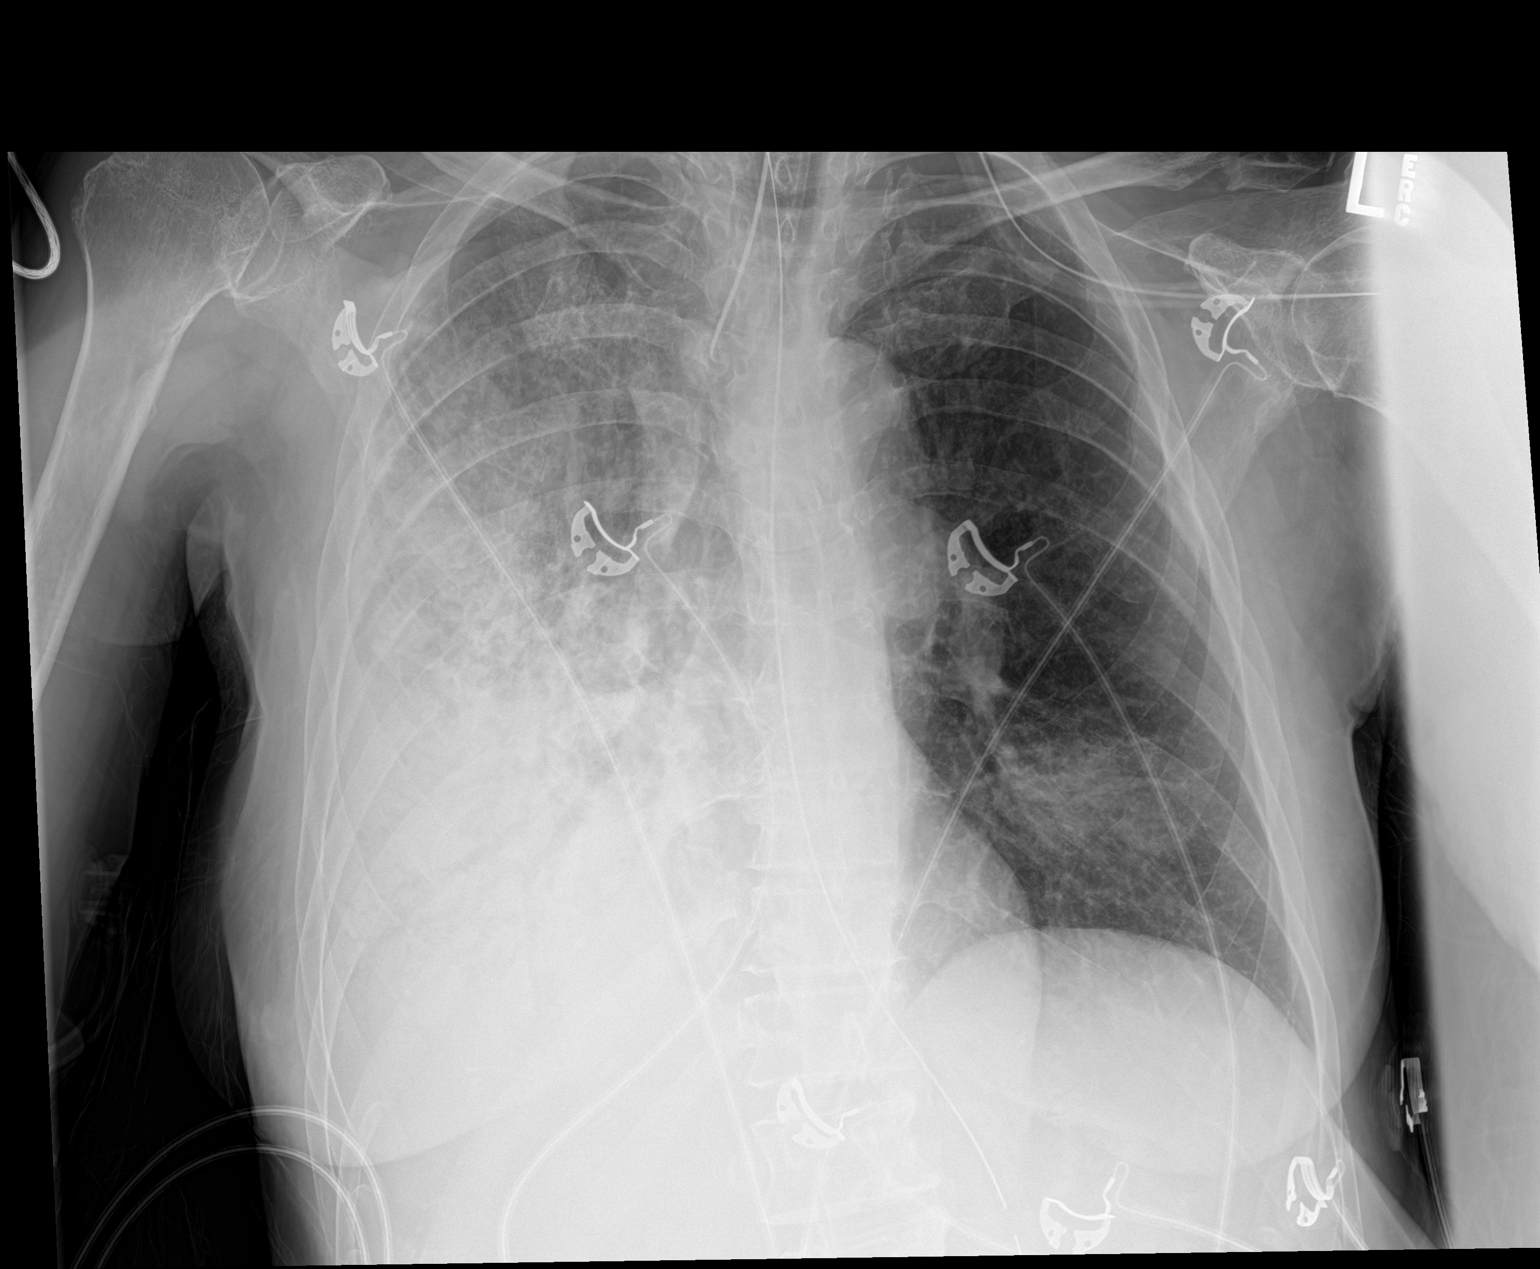

[1 of 1 positions shown; findings below may reference images not displayed]

FINDINGS: Support Apparatus:

--Endotracheal tube: Tip 5 cm above the inferior margin of the
carina.

--Enteric tube:Side port projects over the stomach.

--Catheter(s):None

--Other: None

There is consolidation of the right lung base with air bronchograms.
Small right basilar pleural effusion. The left lung is clear. Normal
cardiomediastinal contours.
IMPRESSION: 1. Tip of the endotracheal tube 5 cm above the inferior margin of
the carina. Enteric tube side port projects over the stomach.
2. Right lung base consolidation with air bronchograms. Small right
pleural effusion.

## 2020-04-09 IMAGING — DX DG CHEST 1V PORT
1 series · 1 of 1 positions shown · non-contrast
Comparison: 10/24/2018 chest x-ray.

CLINICAL DATA: 62-year-old female with acute respiratory failure.
Subsequent encounter.

EXAM:
PORTABLE CHEST 1 VIEW

[chest ap]
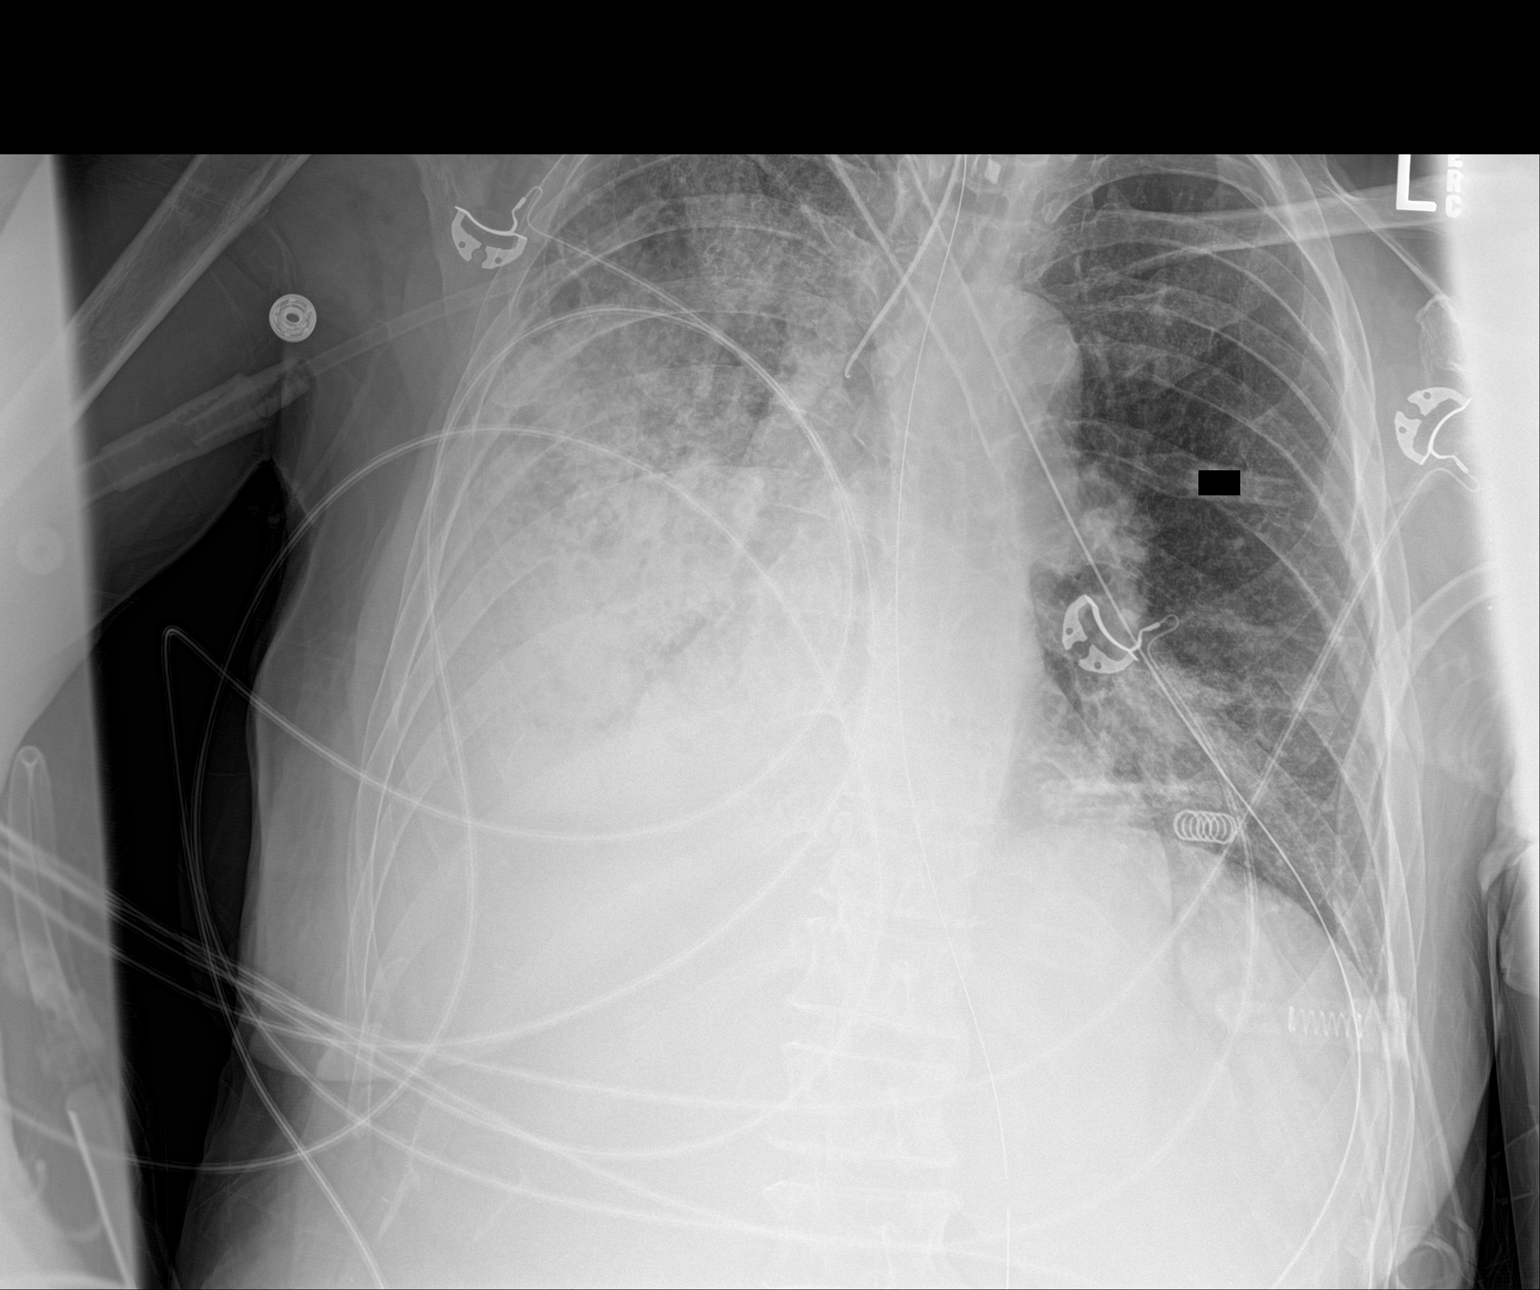

[1 of 1 positions shown; findings below may reference images not displayed]

FINDINGS: Endotracheal tube tip 2.1 cm above the carina. Nasogastric tube side
hole gastric fundus-body level. Tip not imaged on the present exam.

Almost complete consolidation right thorax with sparing right apical
region. Progressive pulmonary vascular congestion left lung.

Cardiomegaly.  Calcified mildly tortuous aorta.
IMPRESSION: 1. Persistent consolidation majority of right thorax with sparing
right lung apex. This may represent infectious infiltrate possibly
with associated pleural effusion.
2. Progressive pulmonary vascular congestion left lung.
3. Endotracheal tube tip 2.1 cm above the carina.
4.  Aortic Atherosclerosis ([id number]).

## 2020-04-20 ENCOUNTER — Emergency Department: Payer: Medicaid Other

## 2020-04-20 ENCOUNTER — Other Ambulatory Visit: Payer: Self-pay

## 2020-04-20 ENCOUNTER — Inpatient Hospital Stay
Admission: EM | Admit: 2020-04-20 | Discharge: 2020-04-30 | DRG: 871 | Disposition: A | Discharge status: Home / Self Care | Payer: Medicaid Other | Attending: Internal Medicine | Admitting: Internal Medicine

## 2020-04-20 DIAGNOSIS — G92 Toxic encephalopathy: Secondary | ICD-10-CM | POA: Diagnosis present

## 2020-04-20 DIAGNOSIS — E876 Hypokalemia: Secondary | ICD-10-CM | POA: Diagnosis not present

## 2020-04-20 DIAGNOSIS — R197 Diarrhea, unspecified: Secondary | ICD-10-CM | POA: Diagnosis not present

## 2020-04-20 DIAGNOSIS — E440 Moderate protein-calorie malnutrition: Secondary | ICD-10-CM | POA: Diagnosis present

## 2020-04-20 DIAGNOSIS — F1120 Opioid dependence, uncomplicated: Secondary | ICD-10-CM | POA: Diagnosis present

## 2020-04-20 DIAGNOSIS — R7881 Bacteremia: Secondary | ICD-10-CM | POA: Diagnosis not present

## 2020-04-20 DIAGNOSIS — D509 Iron deficiency anemia, unspecified: Secondary | ICD-10-CM | POA: Diagnosis present

## 2020-04-20 DIAGNOSIS — D696 Thrombocytopenia, unspecified: Secondary | ICD-10-CM | POA: Diagnosis present

## 2020-04-20 DIAGNOSIS — B182 Chronic viral hepatitis C: Secondary | ICD-10-CM | POA: Diagnosis present

## 2020-04-20 DIAGNOSIS — U071 COVID-19: Secondary | ICD-10-CM

## 2020-04-20 DIAGNOSIS — K766 Portal hypertension: Secondary | ICD-10-CM | POA: Diagnosis present

## 2020-04-20 DIAGNOSIS — J9601 Acute respiratory failure with hypoxia: Secondary | ICD-10-CM | POA: Diagnosis not present

## 2020-04-20 DIAGNOSIS — K7460 Unspecified cirrhosis of liver: Secondary | ICD-10-CM

## 2020-04-20 DIAGNOSIS — Z7982 Long term (current) use of aspirin: Secondary | ICD-10-CM

## 2020-04-20 DIAGNOSIS — R6520 Severe sepsis without septic shock: Secondary | ICD-10-CM | POA: Diagnosis present

## 2020-04-20 DIAGNOSIS — F1410 Cocaine abuse, uncomplicated: Secondary | ICD-10-CM | POA: Diagnosis present

## 2020-04-20 DIAGNOSIS — R9389 Abnormal findings on diagnostic imaging of other specified body structures: Secondary | ICD-10-CM

## 2020-04-20 DIAGNOSIS — D500 Iron deficiency anemia secondary to blood loss (chronic): Secondary | ICD-10-CM | POA: Diagnosis not present

## 2020-04-20 DIAGNOSIS — R509 Fever, unspecified: Secondary | ICD-10-CM | POA: Diagnosis not present

## 2020-04-20 DIAGNOSIS — J1282 Pneumonia due to coronavirus disease 2019: Secondary | ICD-10-CM | POA: Diagnosis present

## 2020-04-20 DIAGNOSIS — J441 Chronic obstructive pulmonary disease with (acute) exacerbation: Secondary | ICD-10-CM | POA: Diagnosis present

## 2020-04-20 DIAGNOSIS — A419 Sepsis, unspecified organism: Secondary | ICD-10-CM | POA: Diagnosis not present

## 2020-04-20 DIAGNOSIS — A4189 Other specified sepsis: Secondary | ICD-10-CM | POA: Diagnosis present

## 2020-04-20 DIAGNOSIS — R6521 Severe sepsis with septic shock: Secondary | ICD-10-CM | POA: Diagnosis not present

## 2020-04-20 DIAGNOSIS — F329 Major depressive disorder, single episode, unspecified: Secondary | ICD-10-CM | POA: Diagnosis present

## 2020-04-20 DIAGNOSIS — Z634 Disappearance and death of family member: Secondary | ICD-10-CM

## 2020-04-20 DIAGNOSIS — F10231 Alcohol dependence with withdrawal delirium: Secondary | ICD-10-CM | POA: Diagnosis present

## 2020-04-20 DIAGNOSIS — J9622 Acute and chronic respiratory failure with hypercapnia: Secondary | ICD-10-CM | POA: Diagnosis present

## 2020-04-20 DIAGNOSIS — F1220 Cannabis dependence, uncomplicated: Secondary | ICD-10-CM | POA: Diagnosis present

## 2020-04-20 DIAGNOSIS — K921 Melena: Secondary | ICD-10-CM | POA: Diagnosis present

## 2020-04-20 DIAGNOSIS — F4320 Adjustment disorder, unspecified: Secondary | ICD-10-CM | POA: Diagnosis present

## 2020-04-20 DIAGNOSIS — J440 Chronic obstructive pulmonary disease with acute lower respiratory infection: Secondary | ICD-10-CM | POA: Diagnosis present

## 2020-04-20 DIAGNOSIS — J9621 Acute and chronic respiratory failure with hypoxia: Secondary | ICD-10-CM | POA: Diagnosis present

## 2020-04-20 DIAGNOSIS — D649 Anemia, unspecified: Secondary | ICD-10-CM

## 2020-04-20 DIAGNOSIS — M810 Age-related osteoporosis without current pathological fracture: Secondary | ICD-10-CM | POA: Diagnosis present

## 2020-04-20 DIAGNOSIS — I10 Essential (primary) hypertension: Secondary | ICD-10-CM | POA: Diagnosis present

## 2020-04-20 DIAGNOSIS — R161 Splenomegaly, not elsewhere classified: Secondary | ICD-10-CM | POA: Diagnosis present

## 2020-04-20 DIAGNOSIS — Z79899 Other long term (current) drug therapy: Secondary | ICD-10-CM

## 2020-04-20 DIAGNOSIS — J189 Pneumonia, unspecified organism: Secondary | ICD-10-CM

## 2020-04-20 DIAGNOSIS — D62 Acute posthemorrhagic anemia: Secondary | ICD-10-CM | POA: Diagnosis present

## 2020-04-20 DIAGNOSIS — Z6825 Body mass index (BMI) 25.0-25.9, adult: Secondary | ICD-10-CM

## 2020-04-20 DIAGNOSIS — B955 Unspecified streptococcus as the cause of diseases classified elsewhere: Secondary | ICD-10-CM | POA: Diagnosis not present

## 2020-04-20 DIAGNOSIS — F17210 Nicotine dependence, cigarettes, uncomplicated: Secondary | ICD-10-CM | POA: Diagnosis present

## 2020-04-20 LAB — COMPREHENSIVE METABOLIC PANEL
ALT: 11 U/L (ref 0–44)
ALT: 10 U/L (ref 0–44)
AST: 22 U/L (ref 15–41)
AST: 20 U/L (ref 15–41)
Albumin: 3 g/dL — ABNORMAL LOW (ref 3.5–5.0)
Albumin: 2.8 g/dL — ABNORMAL LOW (ref 3.5–5.0)
Alkaline Phosphatase: 65 U/L (ref 38–126)
Alkaline Phosphatase: 58 U/L (ref 38–126)
Anion gap: 6 (ref 5–15)
Anion gap: 4 — ABNORMAL LOW (ref 5–15)
BUN: 17 mg/dL (ref 8–23)
BUN: 17 mg/dL (ref 8–23)
CO2: 23 mmol/L (ref 22–32)
CO2: 24 mmol/L (ref 22–32)
Calcium: 7.5 mg/dL — ABNORMAL LOW (ref 8.9–10.3)
Calcium: 7.5 mg/dL — ABNORMAL LOW (ref 8.9–10.3)
Chloride: 103 mmol/L (ref 98–111)
Chloride: 104 mmol/L (ref 98–111)
Creatinine, Ser: 0.75 mg/dL (ref 0.44–1.00)
Creatinine, Ser: 0.87 mg/dL (ref 0.44–1.00)
GFR calc Af Amer: >60 mL/min (ref >60)
GFR calc Af Amer: >60 mL/min (ref >60)
GFR calc non Af Amer: >60 mL/min (ref >60)
GFR calc non Af Amer: >60 mL/min (ref >60)
Glucose, Bld: 106 mg/dL — ABNORMAL HIGH (ref 70–99)
Glucose, Bld: 103 mg/dL — ABNORMAL HIGH (ref 70–99)
Potassium: 4 mmol/L (ref 3.5–5.1)
Potassium: 4.4 mmol/L (ref 3.5–5.1)
Sodium: 132 mmol/L — ABNORMAL LOW (ref 135–145)
Sodium: 132 mmol/L — ABNORMAL LOW (ref 135–145)
Total Bilirubin: 1 mg/dL (ref 0.3–1.2)
Total Bilirubin: 1.2 mg/dL (ref 0.3–1.2)
Total Protein: 7.1 g/dL (ref 6.5–8.1)
Total Protein: 6.7 g/dL (ref 6.5–8.1)

## 2020-04-20 LAB — FIBRINOGEN: Fibrinogen: 172 mg/dL — ABNORMAL LOW (ref 210–475)

## 2020-04-20 LAB — CBC
HCT: 9.8 % — CL (ref 36.0–46.0)
Hemoglobin: 2.3 g/dL — CL (ref 12.0–15.0)
MCH: 17 pg — ABNORMAL LOW (ref 26.0–34.0)
MCHC: 23.5 g/dL — ABNORMAL LOW (ref 30.0–36.0)
MCV: 72.6 fL — ABNORMAL LOW (ref 80.0–100.0)
Platelets: 137 10*3/uL — ABNORMAL LOW (ref 150–400)
RBC: 1.35 MIL/uL — ABNORMAL LOW (ref 3.87–5.11)
RDW: 22.7 % — ABNORMAL HIGH (ref 11.5–15.5)
WBC: 4.8 10*3/uL (ref 4.0–10.5)
nRBC: 0 % (ref 0.0–0.2)

## 2020-04-20 LAB — SARS CORONAVIRUS 2 BY RT PCR (HOSPITAL ORDER, PERFORMED IN ~~LOC~~ HOSPITAL LAB): SARS Coronavirus 2: POSITIVE — AB

## 2020-04-20 LAB — VITAMIN B12: Vitamin B-12: 381 pg/mL (ref 180–914)

## 2020-04-20 LAB — CBC WITH DIFFERENTIAL/PLATELET
Abs Immature Granulocytes: 0.02 10*3/uL (ref 0.00–0.07)
Basophils Absolute: 0 10*3/uL (ref 0.0–0.1)
Basophils Relative: 0 %
Eosinophils Absolute: 0 10*3/uL (ref 0.0–0.5)
Eosinophils Relative: 1 %
HCT: 16 % — ABNORMAL LOW (ref 36.0–46.0)
Hemoglobin: 4.5 g/dL — CL (ref 12.0–15.0)
Immature Granulocytes: 1 %
Lymphocytes Relative: 6 %
Lymphs Abs: 0.2 10*3/uL — ABNORMAL LOW (ref 0.7–4.0)
MCH: 22.1 pg — ABNORMAL LOW (ref 26.0–34.0)
MCHC: 28.1 g/dL — ABNORMAL LOW (ref 30.0–36.0)
MCV: 78.4 fL — ABNORMAL LOW (ref 80.0–100.0)
Monocytes Absolute: 0.1 10*3/uL (ref 0.1–1.0)
Monocytes Relative: 3 %
Neutro Abs: 3.4 10*3/uL (ref 1.7–7.7)
Neutrophils Relative %: 89 %
Platelets: 121 10*3/uL — ABNORMAL LOW (ref 150–400)
RBC: 2.04 MIL/uL — ABNORMAL LOW (ref 3.87–5.11)
RDW: 21.8 % — ABNORMAL HIGH (ref 11.5–15.5)
WBC: 3.8 10*3/uL — ABNORMAL LOW (ref 4.0–10.5)
nRBC: 0 % (ref 0.0–0.2)

## 2020-04-20 LAB — LACTIC ACID, PLASMA
Lactic Acid, Venous: 2.5 mmol/L (ref 0.5–1.9)
Lactic Acid, Venous: 1.4 mmol/L (ref 0.5–1.9)

## 2020-04-20 LAB — TROPONIN I (HIGH SENSITIVITY)
Troponin I (High Sensitivity): 8 ng/L (ref <18)
Troponin I (High Sensitivity): 7 ng/L (ref <18)
Troponin I (High Sensitivity): 6 ng/L (ref <18)
Troponin I (High Sensitivity): 7 ng/L (ref <18)

## 2020-04-20 LAB — LACTATE DEHYDROGENASE: LDH: 177 U/L (ref 98–192)

## 2020-04-20 LAB — HEMOGLOBIN AND HEMATOCRIT, BLOOD
HCT: 16 % — ABNORMAL LOW (ref 36.0–46.0)
Hemoglobin: 4.6 g/dL — CL (ref 12.0–15.0)

## 2020-04-20 LAB — IRON AND TIBC
Iron: 17 ug/dL — ABNORMAL LOW (ref 28–170)
Saturation Ratios: 4 % — ABNORMAL LOW (ref 10.4–31.8)
TIBC: 469 ug/dL — ABNORMAL HIGH (ref 250–450)
UIBC: 452 ug/dL

## 2020-04-20 LAB — FOLATE: Folate: 15.8 ng/mL (ref >5.9)

## 2020-04-20 LAB — PREPARE RBC (CROSSMATCH)

## 2020-04-20 LAB — BRAIN NATRIURETIC PEPTIDE: B Natriuretic Peptide: 679 pg/mL — ABNORMAL HIGH (ref 0.0–100.0)

## 2020-04-20 LAB — GLUCOSE, CAPILLARY: Glucose-Capillary: 127 mg/dL — ABNORMAL HIGH (ref 70–99)

## 2020-04-20 LAB — PROTIME-INR
INR: 1.7 — ABNORMAL HIGH (ref 0.8–1.2)
Prothrombin Time: 18.9 seconds — ABNORMAL HIGH (ref 11.4–15.2)

## 2020-04-20 LAB — ETHANOL: Alcohol, Ethyl (B): <10 mg/dL

## 2020-04-20 LAB — MRSA PCR SCREENING: MRSA by PCR: POSITIVE — AB

## 2020-04-20 LAB — FERRITIN: Ferritin: 7 ng/mL — ABNORMAL LOW (ref 11–307)

## 2020-04-20 LAB — C-REACTIVE PROTEIN: CRP: 0.6 mg/dL (ref <1.0)

## 2020-04-20 LAB — FIBRIN DERIVATIVES D-DIMER (ARMC ONLY): Fibrin derivatives D-dimer (ARMC): 6810.22 ng/mL (FEU) — ABNORMAL HIGH (ref 0.00–499.00)

## 2020-04-20 LAB — PROCALCITONIN: Procalcitonin: <0.1 ng/mL

## 2020-04-20 MED ORDER — SODIUM CHLORIDE 0.9 % IV SOLN: INTRAVENOUS | Status: DC

## 2020-04-20 MED ORDER — SODIUM CHLORIDE 0.9 % IV SOLN
2.0000 g | Freq: Every day | INTRAVENOUS | Status: DC
  Administered 2020-04-20 – 2020-04-24 (×5): 2 g via INTRAVENOUS
  Filled 2020-04-20 (×3): qty 2
  Filled 2020-04-20: qty 20
  Filled 2020-04-20 (×2): qty 2

## 2020-04-20 MED ORDER — ZINC SULFATE 220 (50 ZN) MG PO CAPS
220.0000 mg | ORAL_CAPSULE | Freq: Every day | ORAL | Status: DC
  Administered 2020-04-20 – 2020-04-28 (×5): 220 mg via ORAL
  Filled 2020-04-20 (×7): qty 1

## 2020-04-20 MED ORDER — ONDANSETRON HCL 4 MG/2ML IJ SOLN: 4.0000 mg | Freq: Four times a day (QID) | INTRAMUSCULAR | Status: DC | PRN

## 2020-04-20 MED ORDER — IVERMECTIN 3 MG PO TABS
200.0000 ug/kg | ORAL_TABLET | Freq: Every day | ORAL | Status: AC
  Administered 2020-04-20: 12000 ug via ORAL
  Filled 2020-04-20 (×5): qty 4

## 2020-04-20 MED ORDER — INSULIN DETEMIR 100 UNIT/ML ~~LOC~~ SOLN
0.0750 [IU]/kg | Freq: Two times a day (BID) | SUBCUTANEOUS | Status: DC
  Administered 2020-04-22 – 2020-04-23 (×2): 5 [IU] via SUBCUTANEOUS
  Filled 2020-04-20 (×9): qty 0.05

## 2020-04-20 MED ORDER — ASCORBIC ACID 500 MG PO TABS
500.0000 mg | ORAL_TABLET | Freq: Every day | ORAL | Status: DC
  Administered 2020-04-20 – 2020-04-28 (×4): 500 mg via ORAL
  Filled 2020-04-20 (×6): qty 1

## 2020-04-20 MED ORDER — B COMPLEX-C PO TABS
1.0000 | ORAL_TABLET | Freq: Every day | ORAL | Status: DC
  Administered 2020-04-26 – 2020-04-28 (×3): 1 via ORAL
  Filled 2020-04-20 (×10): qty 1

## 2020-04-20 MED ORDER — SODIUM CHLORIDE 0.9 % IV SOLN
100.0000 mg | Freq: Every day | INTRAVENOUS | Status: AC
  Administered 2020-04-21 – 2020-04-24 (×4): 100 mg via INTRAVENOUS
  Filled 2020-04-20 (×4): qty 100

## 2020-04-20 MED ORDER — DEXMEDETOMIDINE HCL IN NACL 400 MCG/100ML IV SOLN
0.4000 ug/kg/h | INTRAVENOUS | Status: DC
  Administered 2020-04-20: 0.4 ug/kg/h via INTRAVENOUS
  Administered 2020-04-21: 1.4 ug/kg/h via INTRAVENOUS
  Administered 2020-04-21: 1.2 ug/kg/h via INTRAVENOUS
  Administered 2020-04-21: 0.8 ug/kg/h via INTRAVENOUS
  Administered 2020-04-21: 1 ug/kg/h via INTRAVENOUS
  Administered 2020-04-22 (×2): 1.1 ug/kg/h via INTRAVENOUS
  Administered 2020-04-22 (×2): 1 ug/kg/h via INTRAVENOUS
  Administered 2020-04-23 (×3): 1.1 ug/kg/h via INTRAVENOUS
  Administered 2020-04-23 – 2020-04-24 (×2): 1 ug/kg/h via INTRAVENOUS
  Administered 2020-04-24: 0.6 ug/kg/h via INTRAVENOUS
  Filled 2020-04-20 (×16): qty 100

## 2020-04-20 MED ORDER — GUAIFENESIN-DM 100-10 MG/5ML PO SYRP
10.0000 mL | ORAL_SOLUTION | ORAL | Status: DC | PRN
  Filled 2020-04-20: qty 10

## 2020-04-20 MED ORDER — ALBUTEROL SULFATE HFA 108 (90 BASE) MCG/ACT IN AERS
2.0000 | INHALATION_SPRAY | Freq: Four times a day (QID) | RESPIRATORY_TRACT | Status: DC | PRN
  Filled 2020-04-20: qty 6.7

## 2020-04-20 MED ORDER — SODIUM CHLORIDE 0.9 % IV SOLN
10.0000 mL/h | Freq: Once | INTRAVENOUS | Status: AC
  Administered 2020-04-20: 10 mL/h via INTRAVENOUS

## 2020-04-20 MED ORDER — PIPERACILLIN-TAZOBACTAM 3.375 G IVPB 30 MIN
3.3750 g | Freq: Once | INTRAVENOUS | Status: AC
  Administered 2020-04-20: 3.375 g via INTRAVENOUS
  Filled 2020-04-20: qty 50

## 2020-04-20 MED ORDER — CHLORHEXIDINE GLUCONATE 0.12% ORAL RINSE (MEDLINE KIT)
15.0000 mL | Freq: Two times a day (BID) | OROMUCOSAL | Status: DC
  Administered 2020-04-20 – 2020-04-28 (×7): 15 mL via OROMUCOSAL
  Filled 2020-04-20 (×2): qty 15

## 2020-04-20 MED ORDER — VANCOMYCIN HCL IN DEXTROSE 1-5 GM/200ML-% IV SOLN
1000.0000 mg | Freq: Once | INTRAVENOUS | Status: AC
  Administered 2020-04-20: 1000 mg via INTRAVENOUS
  Filled 2020-04-20: qty 200

## 2020-04-20 MED ORDER — DIAZEPAM 5 MG PO TABS
5.0000 mg | ORAL_TABLET | Freq: Four times a day (QID) | ORAL | Status: DC
  Administered 2020-04-20 – 2020-04-30 (×17): 5 mg via ORAL
  Filled 2020-04-20 (×25): qty 1

## 2020-04-20 MED ORDER — LORAZEPAM 2 MG/ML IJ SOLN
INTRAMUSCULAR | Status: AC
  Administered 2020-04-20: 1 mg via INTRAVENOUS
  Filled 2020-04-20: qty 1

## 2020-04-20 MED ORDER — SODIUM CHLORIDE 0.9% IV SOLUTION: Freq: Once | INTRAVENOUS | Status: AC

## 2020-04-20 MED ORDER — SODIUM CHLORIDE 0.9 % IV SOLN
50.0000 ug/h | INTRAVENOUS | Status: DC
  Administered 2020-04-20 – 2020-04-25 (×11): 50 ug/h via INTRAVENOUS
  Filled 2020-04-20 (×26): qty 1

## 2020-04-20 MED ORDER — HYDROCOD POLST-CPM POLST ER 10-8 MG/5ML PO SUER: 5.0000 mL | Freq: Two times a day (BID) | ORAL | Status: DC | PRN

## 2020-04-20 MED ORDER — LACTATED RINGERS IV SOLN: INTRAVENOUS | Status: DC

## 2020-04-20 MED ORDER — PANTOPRAZOLE SODIUM 40 MG IV SOLR
40.0000 mg | Freq: Two times a day (BID) | INTRAVENOUS | Status: DC
  Administered 2020-04-24 – 2020-04-27 (×7): 40 mg via INTRAVENOUS
  Filled 2020-04-20 (×7): qty 40

## 2020-04-20 MED ORDER — INSULIN ASPART 100 UNIT/ML ~~LOC~~ SOLN
0.0000 [IU] | SUBCUTANEOUS | Status: DC
  Administered 2020-04-21 – 2020-04-26 (×5): 1 [IU] via SUBCUTANEOUS
  Administered 2020-04-26 – 2020-04-27 (×2): 2 [IU] via SUBCUTANEOUS
  Administered 2020-04-28: 3 [IU] via SUBCUTANEOUS
  Administered 2020-04-28 (×3): 2 [IU] via SUBCUTANEOUS
  Administered 2020-04-30: 05:00:00 1 [IU] via SUBCUTANEOUS
  Filled 2020-04-20 (×14): qty 1

## 2020-04-20 MED ORDER — OCTREOTIDE LOAD VIA INFUSION
50.0000 ug | Freq: Once | INTRAVENOUS | Status: AC
  Administered 2020-04-20: 50 ug via INTRAVENOUS
  Filled 2020-04-20: qty 25

## 2020-04-20 MED ORDER — LORAZEPAM 2 MG/ML IJ SOLN
1.0000 mg | INTRAMUSCULAR | Status: DC | PRN
  Administered 2020-04-24: 1 mg via INTRAVENOUS
  Filled 2020-04-20 (×2): qty 1

## 2020-04-20 MED ORDER — MELATONIN 5 MG PO TABS
10.0000 mg | ORAL_TABLET | Freq: Every day | ORAL | Status: DC
  Administered 2020-04-24 – 2020-04-27 (×3): 10 mg via ORAL
  Filled 2020-04-20 (×8): qty 2

## 2020-04-20 MED ORDER — SODIUM CHLORIDE 0.9 % IV SOLN
200.0000 mg | Freq: Once | INTRAVENOUS | Status: AC
  Administered 2020-04-20: 200 mg via INTRAVENOUS
  Filled 2020-04-20: qty 40

## 2020-04-20 MED ORDER — DEXAMETHASONE SODIUM PHOSPHATE 10 MG/ML IJ SOLN
6.0000 mg | INTRAMUSCULAR | Status: AC
  Administered 2020-04-20 – 2020-04-29 (×10): 6 mg via INTRAVENOUS
  Filled 2020-04-20: qty 1
  Filled 2020-04-20: qty 0.6
  Filled 2020-04-20 (×2): qty 1
  Filled 2020-04-20: qty 0.6
  Filled 2020-04-20 (×2): qty 1
  Filled 2020-04-20 (×3): qty 0.6

## 2020-04-20 MED ORDER — ORAL CARE MOUTH RINSE
15.0000 mL | OROMUCOSAL | Status: DC
  Administered 2020-04-20 – 2020-04-25 (×25): 15 mL via OROMUCOSAL
  Filled 2020-04-20 (×7): qty 15

## 2020-04-20 MED ORDER — SODIUM CHLORIDE 0.9 % IV SOLN
80.0000 mg | Freq: Once | INTRAVENOUS | Status: AC
  Administered 2020-04-20: 80 mg via INTRAVENOUS
  Filled 2020-04-20: qty 80

## 2020-04-20 MED ORDER — ACETAMINOPHEN 325 MG PO TABS
650.0000 mg | ORAL_TABLET | Freq: Four times a day (QID) | ORAL | Status: DC | PRN
  Administered 2020-04-20 – 2020-04-29 (×3): 650 mg via ORAL
  Filled 2020-04-20 (×3): qty 2

## 2020-04-20 MED ORDER — ONDANSETRON HCL 4 MG PO TABS: 4.0000 mg | ORAL_TABLET | Freq: Four times a day (QID) | ORAL | Status: DC | PRN

## 2020-04-20 MED ORDER — SODIUM CHLORIDE 0.9 % IV SOLN
8.0000 mg/h | INTRAVENOUS | Status: AC
  Administered 2020-04-20 – 2020-04-23 (×7): 8 mg/h via INTRAVENOUS
  Filled 2020-04-20 (×9): qty 80

## 2020-04-20 NOTE — ED Notes (Addendum)
2+ pitting edema noted in lower extremities bilaterally. Swelling noted in pt's eyelids bilaterally. Pt state she is cold, hungry, has chest pain, and c/o fatigue. Pt is AOX4, appears pale. Pt reports cocaine use this morning but states the CP is unrelated to drug use. Pt provided snacks, Pt provided socks, pt provided additional warm blankets, pt repositioned for comfort. Pt states she has not had any acute bleeding, but reports black stools. PT states she's had to have iron infusions in the past and has been treated for pneumonia recently. Pt states "do not put me on life support." Pt requests DNI but states would like CPR.

## 2020-04-20 NOTE — ED Notes (Signed)
Pt changed into brief, clean chux applied. Black stool noted.

## 2020-04-20 NOTE — ED Notes (Signed)
Pt states she does not want writer to call daughter with update at this time.

## 2020-04-20 NOTE — ED Notes (Signed)
Lavender resent to lab

## 2020-04-20 NOTE — Consult Note (Addendum)
Kylie Darby, MD [address]  [place]  [place], [place] [address]  Main: [phone]  Fax: [phone] Pager: [phone]   Consultation  Referring Provider:     No ref. provider found Primary Care Physician:  Alwyn Pea, NP Primary Gastroenterologist: [facility] Reason for Consultation:     Acute anemia, melena  Date of Admission:  04/20/2020 Date of Consultation:  04/20/2020         HPI:   Kylie [place] is a 64 y.o. female history of COPD, protein calorie malnutrition, chronic hepatitis C, treated with Mavyret, last viral load undetected in 02/2018, hypertension, IV drug abuse presented to ER with swelling of extremities and face, complaining of chest pain and she was tearful.  Patient's husband passed away on 2023-03-04 this week, she is not coping well with his diet.  She did smoke crack on 2023/03/04.  Per nursing report, patient had cocaine use this morning.  She did report black stools.  Patient requested DNI status Labs revealed hemoglobin 2.3, MCV 72.6, platelets 137, normal BUN/creatinine, albumin 3 Patient has cirrhosis with splenomegaly, thrombocytopenia Patient is also SARS COVID-19 positive and currently being treated with Remedisivir. She is c/o epigastric pain, feeling very cold, not interested to talk  Patient was admitted to [facility] in 02/2019 secondary to community-acquired pneumonia, acute metabolic encephalopathy.  Her previous hemoglobin was 9.5 in 02/2019.  Patient had severe iron deficiency anemia, ferritin of 5 in 12/2017  NSAIDs: None  Antiplts/Anticoagulants/Anti thrombotics: None  GI Procedures: None  Past Medical History:  Diagnosis Date  . COPD (chronic obstructive pulmonary disease) ([place])   . Hepatitis C   . Hypertension     Past Surgical History:  Procedure Laterality Date  . CHOLECYSTECTOMY    . prolapsed rectum      Prior to Admission medications   Medication Sig Start Date End Date Taking?  Authorizing Provider  albuterol (PROAIR HFA) 108 (90 Base) MCG/ACT inhaler Inhale 1-2 puffs into the lungs every 4 (four) hours as needed for wheezing or shortness of breath.     [provider]  aspirin 81 MG chewable tablet Chew 81 mg by mouth daily. 09/19/16   [provider]  Buprenorphine HCl-Naloxone HCl (SUBOXONE) 8-2 MG FILM Take 1 Film by mouth 3 (three) times daily.     [provider]  cetirizine (ZYRTEC) 10 MG tablet Take 10 mg by mouth daily as needed for itching. 12/13/18   [provider]  fluticasone (FLONASE) 50 MCG/ACT nasal spray Place 2 sprays into both nostrils daily.    [provider]  fluticasone (FLOVENT HFA) 220 MCG/ACT inhaler Inhale 2 puffs into the lungs 2 (two) times daily.    [provider]  folic acid (FOLVITE) 1 MG tablet Take 1 tablet (1 mg total) by mouth daily. 02/25/19   Salary, Holly Bodily D, MD  ipratropium-albuterol (DUONEB) 0.5-2.5 (3) MG/3ML SOLN Inhale 3 mLs into the lungs 4 (four) times daily. 11/25/18   [provider]  lactulose (CHRONULAC) 10 GM/15ML solution Take 30 mLs (20 g total) by mouth daily. 02/25/19   Salary, Avel Peace, MD  Multiple Vitamin (MULTIVITAMIN WITH MINERALS) TABS tablet Take 1 tablet by mouth daily. 02/25/19   Salary, Avel Peace, MD  Nutritional Supplements (ENSURE COMPLETE SHAKE) LIQD Take 1 Can by mouth 4 (four) times daily -  before meals and at bedtime. 02/24/19   Salary, Avel Peace, MD  thiamine 100 MG tablet Take 1 tablet (100 mg total)  by mouth daily. 02/25/19   Salary, Avel Peace, MD  topiramate (TOPAMAX) 25 MG tablet Take 25 mg by mouth daily. 01/26/19   [provider]    Current Facility-Administered Medications:  .  acetaminophen (TYLENOL) tablet 650 mg, 650 mg, Oral, Q6H PRN, Max Sane, MD, 650 mg at 04/20/20 1525 .  albuterol (VENTOLIN HFA) 108 (90 Base) MCG/ACT inhaler 2 puff, 2 puff, Inhalation, Q6H PRN, Manuella Ghazi, Vipul, MD .  ascorbic acid (VITAMIN C) tablet  500 mg, 500 mg, Oral, Daily, Manuella Ghazi, Vipul, MD, 500 mg at 04/20/20 1525 .  chlorhexidine gluconate (MEDLINE KIT) (PERIDEX) 0.12 % solution 15 mL, 15 mL, Mouth Rinse, BID, Manuella Ghazi, Vipul, MD .  chlorpheniramine-HYDROcodone (TUSSIONEX) 10-8 MG/5ML suspension 5 mL, 5 mL, Oral, Q12H PRN, Manuella Ghazi, Vipul, MD .  dexamethasone (DECADRON) injection 6 mg, 6 mg, Intravenous, Q24H, Shah, Vipul, MD .  guaiFENesin-dextromethorphan (ROBITUSSIN DM) 100-10 MG/5ML syrup 10 mL, 10 mL, Oral, Q4H PRN, Manuella Ghazi, Vipul, MD .  insulin aspart (novoLOG) injection 0-9 Units, 0-9 Units, Subcutaneous, Q4H, Shah, Vipul, MD .  insulin detemir (LEVEMIR) injection 5 Units, 0.075 Units/kg, Subcutaneous, BID, Manuella Ghazi, Vipul, MD .  ivermectin (STROMECTOL) tablet 12,000 mcg, 200 mcg/kg, Oral, Daily, Earleen Newport, MD, 12,000 mcg at 04/20/20 1526 .  MEDLINE mouth rinse, 15 mL, Mouth Rinse, 10 times per day, Manuella Ghazi, Vipul, MD .  octreotide (SANDOSTATIN) 2 mcg/mL load via infusion 50 mcg, 50 mcg, Intravenous, Once **AND** octreotide (SANDOSTATIN) 500 mcg in sodium chloride 0.9 % 250 mL (2 mcg/mL) infusion, 50 mcg/hr, Intravenous, Continuous, Shah, Vipul, MD .  ondansetron (ZOFRAN) tablet 4 mg, 4 mg, Oral, Q6H PRN **OR** ondansetron (ZOFRAN) injection 4 mg, 4 mg, Intravenous, Q6H PRN, Manuella Ghazi, Vipul, MD .  pantoprazole (PROTONIX) 80 mg in sodium chloride 0.9 % 100 mL (0.8 mg/mL) infusion, 8 mg/hr, Intravenous, Continuous, Earleen Newport, MD .  Derrill Memo ON 04/24/2020] pantoprazole (PROTONIX) injection 40 mg, 40 mg, Intravenous, Q12H, Earleen Newport, MD .  remdesivir 200 mg in sodium chloride 0.9% 250 mL IVPB, 200 mg, Intravenous, Once **FOLLOWED BY** [START ON 04/21/2020] remdesivir 100 mg in sodium chloride 0.9 % 100 mL IVPB, 100 mg, Intravenous, Daily, Manuella Ghazi, Vipul, MD .  zinc sulfate capsule 220 mg, 220 mg, Oral, Daily, Manuella Ghazi, Vipul, MD, 220 mg at 04/20/20 1525  Current Outpatient Medications:  .  albuterol (PROAIR HFA) 108 (90 Base) MCG/ACT  inhaler, Inhale 1-2 puffs into the lungs every 4 (four) hours as needed for wheezing or shortness of breath. , Disp: , Rfl:  .  aspirin 81 MG chewable tablet, Chew 81 mg by mouth daily., Disp: , Rfl:  .  Buprenorphine HCl-Naloxone HCl (SUBOXONE) 8-2 MG FILM, Take 1 Film by mouth 3 (three) times daily. , Disp: , Rfl:  .  cetirizine (ZYRTEC) 10 MG tablet, Take 10 mg by mouth daily as needed for itching., Disp: , Rfl:  .  fluticasone (FLONASE) 50 MCG/ACT nasal spray, Place 2 sprays into both nostrils daily., Disp: , Rfl:  .  fluticasone (FLOVENT HFA) 220 MCG/ACT inhaler, Inhale 2 puffs into the lungs 2 (two) times daily., Disp: , Rfl:  .  folic acid (FOLVITE) 1 MG tablet, Take 1 tablet (1 mg total) by mouth daily., Disp: 180 tablet, Rfl: 0 .  ipratropium-albuterol (DUONEB) 0.5-2.5 (3) MG/3ML SOLN, Inhale 3 mLs into the lungs 4 (four) times daily., Disp: , Rfl:  .  lactulose (CHRONULAC) 10 GM/15ML solution, Take 30 mLs (20 g total) by mouth daily., Disp: 300 mL, Rfl:  3 .  Multiple Vitamin (MULTIVITAMIN WITH MINERALS) TABS tablet, Take 1 tablet by mouth daily., Disp: 180 tablet, Rfl: 0 .  Nutritional Supplements (ENSURE COMPLETE SHAKE) LIQD, Take 1 Can by mouth 4 (four) times daily -  before meals and at bedtime., Disp: 90 Bottle, Rfl: 3 .  thiamine 100 MG tablet, Take 1 tablet (100 mg total) by mouth daily., Disp: 180 tablet, Rfl: 0 .  topiramate (TOPAMAX) 25 MG tablet, Take 25 mg by mouth daily., Disp: , Rfl:   Family History  Problem Relation Age of Onset  . Cancer Mother      Social History   Tobacco Use  . Smoking status: Current Every Day Smoker    Packs/day: 1.00    Years: 44.00    Pack years: 44.00    Types: Cigarettes  . Smokeless tobacco: Never Used  Substance Use Topics  . Alcohol use: No  . Drug use: Yes    Types: Cocaine    Comment: valium    Allergies as of 04/20/2020  . (No Known Allergies)    Review of Systems:    All systems reviewed and negative except where noted  in HPI.   Physical Exam:  Vital signs in last 24 hours: Temp:  [98.1 F (36.7 C)-98.7 F (37.1 C)] 98.6 F (37 C) (05/14 1529) Pulse Rate:  [95-171] 171 (05/14 1556) Resp:  [12-22] 18 (05/14 1615) BP: (87-112)/(48-70) 104/63 (05/14 1600) SpO2:  [95 %-100 %] 100 % (05/14 1556) Weight:  [63.5 kg] 63.5 kg (05/14 1045)   General: NAD Head:  Normocephalic and atraumatic, bitemporal wasting. Eyes:   No icterus.   Conjunctiva pale. PERRLA. Ears:  Normal auditory acuity. Neck:  Supple; no masses or thyroidomegaly Lungs: Respirations even and unlabored. Lungs clear to auscultation bilaterally.   No wheezes, crackles, or rhonchi.  Heart:  Regular rate and rhythm;  Without murmur, clicks, rubs or gallops Abdomen:  Soft, nondistended, mild epigastric tenderness. Normal bowel sounds. No appreciable masses or hepatomegaly.  No rebound or guarding.  Rectal:  Not performed. Msk:  Symmetrical without gross deformities.  Strength generalized weakness Extremities: 3+ edema, no cyanosis or clubbing. Neurologic:  Alert and oriented x3;  grossly normal neurologically. Skin:  Intact without significant lesions or rashes. Psych: Tearful, depressed  LAB RESULTS: CBC Latest Ref Rng & Units 04/20/2020 02/23/2019 02/20/2019  WBC 4.0 - 10.5 K/uL 4.8 5.1 6.0  Hemoglobin 12.0 - 15.0 g/dL 2.3(LL) 9.5(L) 10.1(L)  Hematocrit 36.0 - 46.0 % 9.8(LL) 31.4(L) 32.4(L)  Platelets 150 - 400 K/uL 137(L) 109(L) 84(L)    BMET BMP Latest Ref Rng & Units 04/20/2020 02/23/2019 02/20/2019  Glucose 70 - 99 mg/dL 106(H) 90 119(H)  BUN 8 - 23 mg/dL 17 5(L) 12  Creatinine 0.44 - 1.00 mg/dL 0.75 0.33(L) 0.40(L)  Sodium 135 - 145 mmol/L 132(L) 137 141  Potassium 3.5 - 5.1 mmol/L 4.0 4.1 3.7  Chloride 98 - 111 mmol/L 103 100 103  CO2 22 - 32 mmol/L 23 33(H) 33(H)  Calcium 8.9 - 10.3 mg/dL 7.5(L) 7.8(L) 7.8(L)    LFT Hepatic Function Latest Ref Rng & Units 04/20/2020 02/23/2019 02/21/2019  Total Protein 6.5 - 8.1 g/dL 7.1 6.6  6.0(L)  Albumin 3.5 - 5.0 g/dL 3.0(L) 2.2(L) 2.3(L)  AST 15 - 41 U/L 22 28 33  ALT 0 - 44 U/L 11 14 17   Alk Phosphatase 38 - 126 U/L 65 68 68  Total Bilirubin 0.3 - 1.2 mg/dL 1.0 0.7 0.7  Bilirubin, Direct 0.0 -  0.2 mg/dL - - 0.3(H)     STUDIES: DG Chest 2 View  Result Date: 04/20/2020 CLINICAL DATA:  Chest pain and generalize pitting edema EXAM: CHEST - 2 VIEW COMPARISON:  February 19, 2019 FINDINGS: There is ill-defined airspace opacity bilaterally, somewhat more severe in the right upper lobe than elsewhere. No consolidation. Areas of prior consolidation on the left if cleared. Heart is mildly enlarged with pulmonary vascularity normal. No adenopathy. There is evidence of an old healed fracture of the lateral left clavicle. There is underlying osteoporosis. IMPRESSION: Compared to prior study, there has been clearing of consolidation on the left. No ever, there is more ill-defined airspace opacity bilaterally, most notably in the right upper lobe, likely representing multifocal pneumonia. Suspect atypical organism etiology. Heart is mildly enlarged with pulmonary vascularity within normal limits. No adenopathy demonstrable by radiography. Bones osteoporotic. Electronically Signed   By: Lowella Grip III M.D.   On: 04/20/2020 11:42      Impression / Plan:   GLADYES KUDO is a 64 y.o. female with chronic hep C, s/p treatment with Mavyret, s/p SVR, IV drug abuse, COPD, protein calorie malnutrition, cirrhosis with splenomegaly, portal hypertension admitted with anasarca, severe symptomatic anemia and melena.  Patient is also Covid positive  Severe iron deficiency anemia, melena: With history of cirrhosis, Recommend octreotide drip Recommend pantoprazole drip Patient has history of crack cocaine, last dose today, patient need to be cleared before undergoing procedure under anesthesia due to high risk in addition to COVID PNA Currently on Zosyn Blood transfusion to reach hemoglobin greater  than 7 Ok with clears Monitor CBC every 4-6 hours Timing of upper endoscopy to be determined based on her hemodynamic stability.  If patient is hemodynamically stable, we will need to wait until she is cleared of cocaine from her system and recovers from PNA  Thank you for involving me in the care of this patient.  Dr. Bonna Gains will cover for the weekend    LOS: 0 days   Sherri Sear, MD  04/20/2020, 4:20 PM   Note: This dictation was prepared with Dragon dictation along with smaller phrase technology. Any transcriptional errors that result from this process are unintentional.

## 2020-04-20 NOTE — ED Notes (Signed)
Emergency release blood started at this time, unit verified by Shanda Bumps, RN

## 2020-04-20 NOTE — ED Notes (Signed)
Nurse Ronnald Collum informed of assigned bed

## 2020-04-20 NOTE — ED Triage Notes (Signed)
Arrives to Ed via [facility] from home c/o 2 weeks of extremity swelling, worse in bilateral lower legs. Reports facial swelling. JVD present on assessment. C/o chest pain. Pt tearful, states husband died 2023/04/17 night and is not coping with death well, "my nerves are tore up".

## 2020-04-20 NOTE — ED Notes (Signed)
Transportation requested  

## 2020-04-20 NOTE — ED Notes (Signed)
Attempted to call report, per ICU, Pt going to room 3 and unit will call when they are ready.

## 2020-04-20 NOTE — ED Notes (Signed)
First emergency unit of blood complete at this time.

## 2020-04-20 NOTE — ED Triage Notes (Signed)
First RN Note: Pt presents to ED via [facility] with c/o generalized swelling. Per EMS pt with +4 pitting edema to BLE x 3-4 weeks. Per EMS pt's husband died 19-May-2023 from Covid, pt has had negative covid tests, EMS reports pt smoked crack cocaine on Wednesday. EMS also reports facial edema, +JVD. Per EMS ST on EKG. Upon arrival to lobby via wheelchair pt repeatedly calling out and demanding water, screener explained to patient would need to be triaged before assessed whether or not patient could have water.   20g L AC 110/52 99% 3L (Chronic O2) 108 ST CBG 191 98.4 19RR

## 2020-04-20 NOTE — ED Provider Notes (Signed)
[facility] provider note       Time seen: ----------------------------------------- 11:35 AM on 04/20/2020 -----------------------------------------   I have reviewed the vital signs and the nursing notes.  HISTORY   Chief Complaint Facial Swelling, Chest Pain, and Extremity Swelling   HPI Kylie [place] is a 64 y.o. female with a history of COPD, hepatitis C, hypertension who presents to the emergency department for extremity swelling as well as facial swelling.  She does complain of chest pain and is tearful.  States her husband died March 29, 2023 and she is not coping well with his death.  She did smoke crack on 2023-03-29.  Past Medical History:  Diagnosis Date  . COPD (chronic obstructive pulmonary disease) ([facility])   . Hepatitis C   . Hypertension     Past Surgical History:  Procedure Laterality Date  . CHOLECYSTECTOMY    . prolapsed rectum      Allergies Patient has no known allergies.   Review of Systems Constitutional: Negative for fever. HEENT: Positive for facial swelling Cardiovascular: Negative for chest pain. Respiratory: Negative for shortness of breath. Gastrointestinal: Negative for abdominal pain, vomiting and diarrhea. Musculoskeletal: Positive for edema and swelling Skin: Negative for rash. Neurological: Negative for headaches, focal weakness or numbness. Psychiatric: Positive for anxiety  All systems negative/normal/unremarkable except as stated in the HPI  ____________________________________________   PHYSICAL EXAM:  VITAL SIGNS: ED Triage Vitals  Enc Vitals Group     BP 04/20/20 1042 112/66     Pulse Rate 04/20/20 1042 (!) 103     Resp 04/20/20 1042 (!) 22     Temp 04/20/20 1042 98.1 F (36.7 C)     Temp Source 04/20/20 1042 Oral     SpO2 04/20/20 1042 95 %     Weight 04/20/20 1045 140 lb (63.5 kg)     Height 04/20/20 1045 5\' 2"  (1.575 m)     Head Circumference --      Peak Flow --      Pain Score  04/20/20 1043 6     Pain Loc --      Pain Edu? --      Excl. in GC? --     Constitutional: Alert and oriented.  Chronically ill-appearing, no distress Eyes: Conjunctivae are normal.  Periorbital edema is noted ENT      Head: Normocephalic and atraumatic.      Nose: No congestion/rhinnorhea.      Mouth/Throat: Mucous membranes are moist.      Neck: No stridor. Cardiovascular: Rapid rate, regular rhythm. No murmurs, rubs, or gallops. Respiratory: Normal respiratory effort without tachypnea nor retractions.  Rales are noted bilaterally Gastrointestinal: Soft and nontender. Normal bowel sounds Rectal: Black stool, heme positive Musculoskeletal: Limited range of motion of the extremities marked pitting edema is noted to both knees bilaterally Neurologic:  Normal speech and language. No gross focal neurologic deficits are appreciated.  Skin:  Skin is warm, dry and intact.  Pallor is noted Psychiatric: Depressed mood and affect ____________________________________________  EKG: Interpreted by me.  Sinus tachycardia with rate of 105 bpm, nonspecific ST-T wave changes, normal axis, normal QT  ____________________________________________   LABS (pertinent positives/negatives)  Labs Reviewed  COMPREHENSIVE METABOLIC PANEL - Abnormal; Notable for the following components:      Result Value   Sodium 132 (*)    Glucose, Bld 106 (*)    Calcium 7.5 (*)    Albumin 3.0 (*)    All other components within normal limits  LACTIC  ACID, PLASMA - Abnormal; Notable for the following components:   Lactic Acid, Venous 2.5 (*)    All other components within normal limits  PROTIME-INR - Abnormal; Notable for the following components:   Prothrombin Time 18.9 (*)    INR 1.7 (*)    All other components within normal limits  CBC - Abnormal; Notable for the following components:   RBC 1.35 (*)    Hemoglobin 2.3 (*)    HCT 9.8 (*)    MCV 72.6 (*)    MCH 17.0 (*)    MCHC 23.5 (*)    RDW 22.7 (*)     Platelets 137 (*)    All other components within normal limits  CULTURE, BLOOD (ROUTINE X 2)  CULTURE, BLOOD (ROUTINE X 2)  URINE CULTURE  SARS CORONAVIRUS 2 BY RT PCR (HOSPITAL ORDER, [facility])  ETHANOL  LACTIC ACID, PLASMA  URINALYSIS, ROUTINE W REFLEX MICROSCOPIC  URINE DRUG SCREEN, QUALITATIVE ([facility] ONLY)  CBC WITH DIFFERENTIAL/PLATELET  TYPE AND SCREEN  PREPARE RBC (CROSSMATCH)  TROPONIN I (HIGH SENSITIVITY)  TROPONIN I (HIGH SENSITIVITY)   Radiology: CXR IMPRESSION: Compared to prior study, there has been clearing of consolidation on the left. No ever, there is more ill-defined airspace opacity bilaterally, most notably in the right upper lobe, likely representing multifocal pneumonia. Suspect atypical organism etiology.  Heart is mildly enlarged with pulmonary vascularity within normal limits. No adenopathy demonstrable by radiography.  Bones osteoporotic.  CRITICAL CARE Performed by: Laurence Aly   Total critical care time: 30 minutes  Critical care time was exclusive of separately billable procedures and treating other patients.  Critical care was necessary to treat or prevent imminent or life-threatening deterioration.  Critical care was time spent personally by me on the following activities: development of treatment plan with patient and/or surrogate as well as nursing, discussions with consultants, evaluation of patient's response to treatment, examination of patient, obtaining history from patient or surrogate, ordering and performing treatments and interventions, ordering and review of laboratory studies, ordering and review of radiographic studies, pulse oximetry and re-evaluation of patient's condition.  Differential diagnosis: Peripheral edema, renal failure, dehydration, electrolyte abnormality, sepsis, pneumonia, substance abuse   ASSESSMENT AND PLAN  Peripheral edema, multifocal pneumonia, severe anemia,  melena   Plan: The patient had presented for peripheral edema with also facial swelling and chest pain.  Sepsis protocols were initiated.  X-rays revealed ill-defined airspace opacities bilaterally resembling multifocal pneumonia.  Patient's labs indicated numerous abnormalities the worst of which was severe anemia.  This is likely due to her GI bleeding as she had gross melena on examination here that was heme positive.  She also tested positive for COVID-19.  I have ordered ivermectin for her.  Also gave her IV Protonix for upper GI bleeding.  She has had emergency release blood ordered and I have discussed with the hospitalist for admission.  Lenise Arena MD    Note: This note was generated in part or whole with voice recognition software. Voice recognition is usually quite accurate but there are transcription errors that can and very often do occur. I apologize for any typographical errors that were not detected and corrected.     Earleen Newport, MD 04/20/20 1446

## 2020-04-20 NOTE — Progress Notes (Signed)
Pharmacy Electrolyte Monitoring Consult:  Pharmacy consulted to assist in monitoring and replacing electrolytes in this 64 y.o. female admitted on 04/20/2020 with Facial Swelling, Chest Pain, and Extremity Swelling  Labs:  Sodium (mmol/L)  Date Value  04/20/2020 132 (L)  03/13/2014 136   Potassium (mmol/L)  Date Value  04/20/2020 4.4  03/13/2014 3.7   Magnesium (mg/dL)  Date Value  31/59/4585 1.7   Phosphorus (mg/dL)  Date Value  92/92/4462 2.1 (L)   Calcium (mg/dL)  Date Value  86/38/1771 7.5 (L)   Calcium, Total (mg/dL)  Date Value  16/57/9038 8.3 (L)   Albumin (g/dL)  Date Value  33/38/3291 2.8 (L)  03/14/2014 2.3 (L)    Assessment/Plan: Electrolytes: patient receiving LR at 150mL/hr. No further replacement warranted. Will obtain all electrolytes with a labs.   Glucose: patient on dexamethazone 10mg  IV Daily. No SSI warranted. Will follow with daily labs and add as warranted.   Pharmacy will continue to monitor and adjust per consult.   Delanie Tirrell L 04/20/2020 8:49 PM

## 2020-04-20 NOTE — H&P (Signed)
[place] at [facility]   PATIENT NAME: Kylie Monroe    MR#:  [id number]  DATE OF BIRTH:  [birth year]  DATE OF ADMISSION:  04/20/2020  PRIMARY CARE PHYSICIAN: Eula Flax, NP   REQUESTING/REFERRING PHYSICIAN: Emily Filbert, MD  CHIEF COMPLAINT:   Chief Complaint  Patient presents with  . Facial Swelling  . Chest Pain  . Extremity Swelling    HISTORY OF PRESENT ILLNESS:  Kylie Monroe  is a 64 y.o. female with a known history of COPD, protein calorie malnutrition, hepatitis C, hypertension, IV drug abuse being admitted for COVID pneumonia and severe anemia. She presented with swelling of extremities and face, complaining of chest pain and she was tearful. Patient's boyfriend/significant other passed away on 04-07-2023 this week at [facility] (he was admitted there for COVID and died of complication, she is not coping well with this.  She did smoke crack on 04/07/23 per records.  Per nursing report, patient had cocaine use this morning.  She did report black stools. Patient c/o severe headache and chest pain and requesting pain meds.  I talked with her POA (Daughter [name]) who is requesting full code and is very emotional about her illness. PAST MEDICAL HISTORY:   Past Medical History:  Diagnosis Date  . COPD (chronic obstructive pulmonary disease) (HCC)   . Hepatitis C   . Hypertension    PAST SURGICAL HISTORY:   Past Surgical History:  Procedure Laterality Date  . CHOLECYSTECTOMY    . prolapsed rectum     SOCIAL HISTORY:   Social History   Tobacco Use  . Smoking status: Current Every Day Smoker    Packs/day: 1.00    Years: 44.00    Pack years: 44.00    Types: Cigarettes  . Smokeless tobacco: Never Used  Substance Use Topics  . Alcohol use: No   FAMILY HISTORY:   Family History  Problem Relation Age of Onset  . Cancer Mother    DRUG ALLERGIES:  No Known Allergies REVIEW OF SYSTEMS:  Review of Systems  Constitutional: Positive for  malaise/fatigue. Negative for diaphoresis, fever and weight loss.  HENT: Negative for ear discharge, ear pain, hearing loss, nosebleeds, sore throat and tinnitus.   Eyes: Negative for blurred vision and pain.  Respiratory: Positive for shortness of breath. Negative for cough, hemoptysis and wheezing.   Cardiovascular: Positive for chest pain. Negative for palpitations, orthopnea and leg swelling.  Gastrointestinal: Positive for melena. Negative for abdominal pain, blood in stool, constipation, diarrhea, heartburn, nausea and vomiting.  Genitourinary: Negative for dysuria, frequency and urgency.  Musculoskeletal: Negative for back pain and myalgias.  Skin: Negative for itching and rash.  Neurological: Positive for headaches. Negative for dizziness, tingling, tremors, focal weakness, seizures and weakness.  Psychiatric/Behavioral: Negative for depression. The patient is not nervous/anxious.    MEDICATIONS AT HOME:   Prior to Admission medications   Medication Sig Start Date End Date Taking? Authorizing Provider  albuterol (PROAIR HFA) 108 (90 Base) MCG/ACT inhaler Inhale 1-2 puffs into the lungs every 4 (four) hours as needed for wheezing or shortness of breath.     [provider]  aspirin 81 MG chewable tablet Chew 81 mg by mouth daily. 09/19/16   [provider]  Buprenorphine HCl-Naloxone HCl (SUBOXONE) 8-2 MG FILM Take 1 Film by mouth 3 (three) times daily.     [provider]  cetirizine (ZYRTEC) 10 MG tablet Take 10 mg by mouth daily as needed for itching. 12/13/18   [provider]  fluticasone (FLONASE) 50 MCG/ACT nasal spray Place 2 sprays into both nostrils daily.    [provider]  fluticasone (FLOVENT HFA) 220 MCG/ACT inhaler Inhale 2 puffs into the lungs 2 (two) times daily.    [provider]  folic acid (FOLVITE) 1 MG tablet Take 1 tablet (1 mg total) by mouth daily. 02/25/19   Salary, Jetty Duhamel D, MD  ipratropium-albuterol  (DUONEB) 0.5-2.5 (3) MG/3ML SOLN Inhale 3 mLs into the lungs 4 (four) times daily. 11/25/18   [provider]  lactulose (CHRONULAC) 10 GM/15ML solution Take 30 mLs (20 g total) by mouth daily. 02/25/19   Salary, Evelena Asa, MD  Multiple Vitamin (MULTIVITAMIN WITH MINERALS) TABS tablet Take 1 tablet by mouth daily. 02/25/19   Salary, Evelena Asa, MD  Nutritional Supplements (ENSURE COMPLETE SHAKE) LIQD Take 1 Can by mouth 4 (four) times daily -  before meals and at bedtime. 02/24/19   Salary, Evelena Asa, MD  thiamine 100 MG tablet Take 1 tablet (100 mg total) by mouth daily. 02/25/19   Salary, Evelena Asa, MD  topiramate (TOPAMAX) 25 MG tablet Take 25 mg by mouth daily. 01/26/19   [provider]    VITAL SIGNS:  Blood pressure 96/69, pulse 97, temperature 98.7 F (37.1 C), temperature source Oral, resp. rate 20, height 5\' 2"  (1.575 m), weight 63.5 kg, SpO2 100 %. PHYSICAL EXAMINATION:  Physical Exam  GENERAL:  64 y.o.-year-old patient lying in the bed in acute distress.  EYES: Pupils equal, round, reactive to light and accommodation. No scleral icterus. Extraocular muscles intact.  Pale sclera HEENT: Head atraumatic, normocephalic. Oropharynx and nasopharynx clear.  NECK:  Supple, no jugular venous distention. No thyroid enlargement, no tenderness.  LUNGS: Decreased breath sounds bilaterally, no wheezing, rales,rhonchi or crepitation. No use of accessory muscles of respiration.  CARDIOVASCULAR: S1, S2 normal. No murmurs, rubs, or gallops.  ABDOMEN: Soft, nontender, nondistended. Bowel sounds present. No organomegaly or mass.  EXTREMITIES: No pedal edema, cyanosis, or clubbing.  NEUROLOGIC: Cranial nerves II through XII are intact. Muscle strength 5/5 in all extremities. Sensation intact. Gait not checked.  PSYCHIATRIC: The patient is alert and oriented x 3. Seems depressed SKIN: No obvious rash, lesion, or ulcer.  Very pale LABORATORY PANEL:   CBC Recent Labs  Lab 04/20/20 1246   WBC 4.8  HGB 2.3*  HCT 9.8*  PLT 137*   ------------------------------------------------------------------------------------------------------------------  Chemistries  Recent Labs  Lab 04/20/20 1124  NA 132*  K 4.0  CL 103  CO2 23  GLUCOSE 106*  BUN 17  CREATININE 0.75  CALCIUM 7.5*  AST 22  ALT 11  ALKPHOS 65  BILITOT 1.0   ------------------------------------------------------------------------------------------------------------------  Cardiac Enzymes No results for input(s): TROPONINI in the last 168 hours. ------------------------------------------------------------------------------------------------------------------  RADIOLOGY:  DG Chest 2 View  Result Date: 04/20/2020 CLINICAL DATA:  Chest pain and generalize pitting edema EXAM: CHEST - 2 VIEW COMPARISON:  February 19, 2019 FINDINGS: There is ill-defined airspace opacity bilaterally, somewhat more severe in the right upper lobe than elsewhere. No consolidation. Areas of prior consolidation on the left if cleared. Heart is mildly enlarged with pulmonary vascularity normal. No adenopathy. There is evidence of an old healed fracture of the lateral left clavicle. There is underlying osteoporosis. IMPRESSION: Compared to prior study, there has been clearing of consolidation on the left. No ever, there is more ill-defined airspace opacity bilaterally, most notably in the right upper lobe, likely representing multifocal pneumonia. Suspect atypical organism etiology. Heart is mildly  enlarged with pulmonary vascularity within normal limits. No adenopathy demonstrable by radiography. Bones osteoporotic. Electronically Signed   By: Lowella Grip III M.D.   On: 04/20/2020 11:42   IMPRESSION AND PLAN:  Kylie Monroe is a 64 y.o. female history of COPD, protein calorie malnutrition, hepatitis C, hypertension, IV drug abuse admitted for Sepsis due to COVID pneumonia  * Sepsis with multiorgan failure - due to covid pna  *  COVID Pneumonia - Remdesevir + Steroids per pharmacy protocol - Vit C + zinc - proning  - on 3 liters O2 - daily inflammatory markers   * Acute hypoxic resp failure - requiring 3 liters O2 - PCCM c/s - Dr Mortimer Fries aware  * Severe Acute Blood Loss Anemia - Hb 2.3 - Melena, Hemoccult + in ED - GI c/s - protonix drip - massive transfusion protocol,  - continue transfusion till Hb > 7 - H & H Q 4 hrs  * Depression, cocaine abuse - await UDS - pending - her significant other just passed away 2 days ago - may need psych eval   She looks critically sick and high risk for cardiorespiratory failure, multiorgan failure and death   All the records are reviewed and case discussed with ED provider. Management plans discussed with the patient, family (daughter/POA) and they are in agreement.  CODE STATUS: FULL CODE  TOTAL TIME (Critical Care) TAKING CARE OF THIS PATIENT: 45 minutes.    Max Sane M.D on 04/20/2020 at 3:27 PM  [facility]   CC: Primary care physician; Alwyn Pea, NP   Note: This dictation was prepared with Dragon dictation along with smaller phrase technology. Any transcriptional errors that result from this process are unintentional.

## 2020-04-20 NOTE — ED Notes (Signed)
Lavender drawn from new line in right [facility] and sent to lab at this time.

## 2020-04-20 NOTE — Consult Note (Signed)
Remdesivir - Pharmacy Brief Note   O:  ALT: 11 CXR: Compared to prior study, there has been clearing of consolidation on the left. No ever, there is more ill-defined airspace opacity bilaterally, most notably in the right upper lobe, likely representing multifocal pneumonia. Suspect atypical organism etiology. SpO2: 95% on 3L   A/P:  Remdesivir 200 mg IVPB once followed by 100 mg IVPB daily x 4 days.   Bettey Costa, PharmD Clinical Pharmacist 04/20/2020 2:57 PM

## 2020-04-20 NOTE — Consult Note (Signed)
Name: Kylie Monroe MRN: [id number] DOB: 1956-02-23     CONSULTATION DATE: 04/20/2020  REFERRING MD :  Rogue Jury  CHIEF COMPLAINT:  SOB  STUDIES:  CXR 5/14 b/l opacities, LEFT lung improving   HISTORY OF PRESENT ILLNESS:  64 y.o. female with a known history of COPD, protein calorie malnutrition, hepatitis C, hypertension, IV drug abuse  +  COVID pneumonia and severe anemia -She presented with swelling of extremities and face, complaining of chest pain and she was tearful.   -Patient's boyfriend/significant other passed away on Apr 23, 2023 this week at [facility] (he was admitted there for COVID and died of complication, she is not coping well with this.   -She did smoke crack on 2023/04/23 per records.   patient had cocaine use this morning. She did report black stools. Patient c/o severe headache and chest pain and requesting pain meds.  Patient on minimal oxygen HGB 2.3 Patient using COCAINE Alert and awake and tearful  CBC    Component Value Date/Time   WBC 4.8 04/20/2020 1246   RBC 1.35 (L) 04/20/2020 1246   HGB 2.3 (LL) 04/20/2020 1246   HGB 13.7 03/13/2014 1603   HCT 9.8 (LL) 04/20/2020 1246   HCT 41.7 03/13/2014 1603   PLT 137 (L) 04/20/2020 1246   PLT 100 (L) 03/13/2014 1603   MCV 72.6 (L) 04/20/2020 1246   MCV 102 (H) 03/13/2014 1603   MCH 17.0 (L) 04/20/2020 1246   MCHC 23.5 (L) 04/20/2020 1246   RDW 22.7 (H) 04/20/2020 1246   RDW 15.1 (H) 03/13/2014 1603   LYMPHSABS 0.6 (L) 11/05/2018 0605   MONOABS 0.4 11/05/2018 0605   EOSABS 0.0 11/05/2018 0605   BASOSABS 0.0 11/05/2018 0605   BMP Latest Ref Rng & Units 04/20/2020 02/23/2019 02/20/2019  Glucose 70 - 99 mg/dL 767(M) 90 094(B)  BUN 8 - 23 mg/dL 17 5(L) 12  Creatinine 0.44 - 1.00 mg/dL 0.96 2.83(M) 6.29(U)  Sodium 135 - 145 mmol/L 132(L) 137 141  Potassium 3.5 - 5.1 mmol/L 4.0 4.1 3.7  Chloride 98 - 111 mmol/L 103 100 103  CO2 22 - 32 mmol/L 23 33(H) 33(H)  Calcium 8.9 - 10.3 mg/dL 7.5(L) 7.8(L) 7.8(L)      PAST MEDICAL HISTORY :   has a past medical history of COPD (chronic obstructive pulmonary disease) (HCC), Hepatitis C, and Hypertension.  has a past surgical history that includes Cholecystectomy and prolapsed rectum. Prior to Admission medications   Medication Sig Start Date End Date Taking? Authorizing Provider  albuterol (PROAIR HFA) 108 (90 Base) MCG/ACT inhaler Inhale 1-2 puffs into the lungs every 4 (four) hours as needed for wheezing or shortness of breath.     [provider]  aspirin 81 MG chewable tablet Chew 81 mg by mouth daily. 09/19/16   [provider]  Buprenorphine HCl-Naloxone HCl (SUBOXONE) 8-2 MG FILM Take 1 Film by mouth 3 (three) times daily.     [provider]  cetirizine (ZYRTEC) 10 MG tablet Take 10 mg by mouth daily as needed for itching. 12/13/18   [provider]  fluticasone (FLONASE) 50 MCG/ACT nasal spray Place 2 sprays into both nostrils daily.    [provider]  fluticasone (FLOVENT HFA) 220 MCG/ACT inhaler Inhale 2 puffs into the lungs 2 (two) times daily.    [provider]  folic acid (FOLVITE) 1 MG tablet Take 1 tablet (1 mg total) by mouth daily. 02/25/19   Salary, Evelena Asa, MD  ipratropium-albuterol (DUONEB) 0.5-2.5 (3) MG/3ML  SOLN Inhale 3 mLs into the lungs 4 (four) times daily. 11/25/18   [provider]  lactulose (CHRONULAC) 10 GM/15ML solution Take 30 mLs (20 g total) by mouth daily. 02/25/19   Salary, Evelena Asa, MD  Multiple Vitamin (MULTIVITAMIN WITH MINERALS) TABS tablet Take 1 tablet by mouth daily. 02/25/19   Salary, Evelena Asa, MD  Nutritional Supplements (ENSURE COMPLETE SHAKE) LIQD Take 1 Can by mouth 4 (four) times daily -  before meals and at bedtime. 02/24/19   Salary, Evelena Asa, MD  thiamine 100 MG tablet Take 1 tablet (100 mg total) by mouth daily. 02/25/19   Salary, Evelena Asa, MD  topiramate (TOPAMAX) 25 MG tablet Take 25 mg by mouth daily. 01/26/19   [provider]    No Known Allergies  FAMILY HISTORY:  family history includes Cancer in her mother. SOCIAL HISTORY:  reports that she has been smoking cigarettes. She has a 44.00 pack-year smoking history. She has never used smokeless tobacco. She reports current drug use. Drug: Cocaine. She reports that she does not drink alcohol.   Review of Systems:  Gen:  Denies  fever, sweats, chills weight loss  HEENT: Denies blurred vision, double vision, ear pain, eye pain, hearing loss, nose bleeds, sore throat Cardiac:  +chest pain or heaviness,+ chest tightness,edema, No JVD Resp:   No cough, -sputum production, +shortness of breath-chronic -wheezing, -hemoptysis,  Gi: Denies swallowing difficulty, stomach pain, nausea or vomiting, diarrhea, constipation, bowel incontinence Gu:  Denies bladder incontinence, burning urine Ext:   Denies Joint pain, stiffness or swelling Skin: Denies  skin rash, easy bruising or bleeding or hives Endoc:  Denies polyuria, polydipsia , polyphagia or weight change Psych:   Denies depression, insomnia or hallucinations  Other:  All other systems negative   Estimated body mass index is 25.61 kg/m as calculated from the following:   Height as of this encounter: 5\' 2"  (1.575 m).   Weight as of this encounter: 63.5 kg.    VITAL SIGNS: Temp:  [98.1 F (36.7 C)-98.7 F (37.1 C)] 98.7 F (37.1 C) (05/14 1714) Pulse Rate:  [90-171] 90 (05/14 1714) Resp:  [12-22] 16 (05/14 1714) BP: (87-115)/(48-75) 115/75 (05/14 1714) SpO2:  [95 %-100 %] 100 % (05/14 1714) Weight:  [63.5 kg] 63.5 kg (05/14 1045)   No intake/output data recorded. Total I/O In: 460 [Blood:360; IV Piggyback:100] Out: -    SpO2: 100 % O2 Flow Rate (L/min): 3 L/min   Physical Examination:  GENERAL:critically ill appearing, thin and cachectic HEAD: Normocephalic, atraumatic.  EYES: Pupils equal, round, reactive to light.  No scleral icterus.  MOUTH: Moist mucosal membrane. NECK: Supple. No JVD.   PULMONARY: +rhonchi, CARDIOVASCULAR: S1 and S2. Regular rate and rhythm. No murmurs, rubs, or gallops.  GASTROINTESTINAL: Soft, nontender, -distended.  Positive bowel sounds.  MUSCULOSKELETAL: No swelling, clubbing, or edema.  NEUROLOGIC: alert and awake SKIN:intact,warm,dry   MEDICATIONS: I have reviewed all medications and confirmed regimen as documented   CULTURE RESULTS   Recent Results (from the past 240 hour(s))  SARS Coronavirus 2 by RT PCR (hospital order, performed in [facility] hospital lab) Nasopharyngeal Nasopharyngeal Swab     Status: Abnormal   Collection Time: 04/20/20 12:57 PM   Specimen: Nasopharyngeal Swab  Result Value Ref Range Status   SARS Coronavirus 2 POSITIVE (A) NEGATIVE Final    Comment: RESULT CALLED TO, READ BACK BY AND VERIFIED WITH:  LEXIE OLIVER AT 1416 04/20/20 (NOTE) SARS-CoV-2 target nucleic acids are DETECTED SARS-CoV-2 RNA is  generally detectable in upper respiratory specimens  during the acute phase of infection.  Positive results are indicative  of the presence of the identified virus, but do not rule out bacterial infection or co-infection with other pathogens not detected by the test.  Clinical correlation with patient history and  other diagnostic information is necessary to determine patient infection status.  The expected result is negative. Fact Sheet for Patients:   StrictlyIdeas.no  Fact Sheet for Healthcare Providers:   BankingDealers.co.za   This test is not yet approved or cleared by the [place] FDA and  has been authorized for detection and/or diagnosis of SARS-CoV-2 by FDA under an Emergency Use Authorization (EUA).  This EUA will remain in effect (meaning this test can be use d) for the duration of  the COVID-19 declaration under Section 564(b)(1) of the Act, 21 U.S.C. section 360-bbb-3(b)(1), unless the authorization is terminated or revoked sooner. Performed at  [facility], [address] [place], [place] [address]           IMAGING    DG Chest 2 View  Result Date: 04/20/2020 CLINICAL DATA:  Chest pain and generalize pitting edema EXAM: CHEST - 2 VIEW COMPARISON:  February 19, 2019 FINDINGS: There is ill-defined airspace opacity bilaterally, somewhat more severe in the right upper lobe than elsewhere. No consolidation. Areas of prior consolidation on the left if cleared. Heart is mildly enlarged with pulmonary vascularity normal. No adenopathy. There is evidence of an old healed fracture of the lateral left clavicle. There is underlying osteoporosis. IMPRESSION: Compared to prior study, there has been clearing of consolidation on the left. No ever, there is more ill-defined airspace opacity bilaterally, most notably in the right upper lobe, likely representing multifocal pneumonia. Suspect atypical organism etiology. Heart is mildly enlarged with pulmonary vascularity within normal limits. No adenopathy demonstrable by radiography. Bones osteoporotic. Electronically Signed   By: Lowella Grip III M.D.   On: 04/20/2020 11:42     Nutrition Status:           ASSESSMENT AND PLAN SYNOPSIS  FACIAL SWELLING AND COCAINE ABUSE WITH COVID 19 PNEUMONIA WITH SEVERE ANEMIA FROM GIB  COVID-19 infection, pneumonia/pneumonitis Hypoxia is at baseline-use oxygen as needed  IV steroids  IV remdisivir Aggressive pulm toilet recommended OOB to chair as tolerated  Pulmonary hygiene Maintain airborne and contact precautions  As needed bronchodilators (MDI) Vitamin C and zinc Antitussives IVERMECTIN DOSING OXYGEN AS NEEDED   CARDIAC ICU monitoring  ID -continue IV abx as prescibed -follow up cultures  GI GI PROPHYLAXIS as indicated On PPI +GIB-follow up GI recs  NUTRITIONAL STATUS DIET-->as tolerated Constipation protocol as indicated   ENDO - will use ICU hypoglycemic\Hyperglycemia protocol if  needed  ELECTROLYTES -follow labs as needed -replace as needed -pharmacy consultation and following   GI PRX ordered and assessed TRANSFUSIONS AS NEEDED MONITOR FSBS I Assessed the need for Labs I Assessed the need for Foley I Assessed the need for Central Venous Line I Assessed the need for Mobilization I made an Assessment of medications to be adjusted accordingly Safety Risk assessment Completed  CASE DISCUSSED IN MULTIDISCIPLINARY ROUNDS WITH ICU TEAM   Adaria Hole Patricia Pesa, M.D.  [facility] Pulmonary & Critical Care Medicine  Medical Director [place] Director [place] Department

## 2020-04-21 ENCOUNTER — Inpatient Hospital Stay: Payer: Self-pay

## 2020-04-21 DIAGNOSIS — F10231 Alcohol dependence with withdrawal delirium: Secondary | ICD-10-CM

## 2020-04-21 DIAGNOSIS — F1410 Cocaine abuse, uncomplicated: Secondary | ICD-10-CM

## 2020-04-21 LAB — BLOOD CULTURE ID PANEL (REFLEXED)

## 2020-04-21 LAB — CBC WITH DIFFERENTIAL/PLATELET
Abs Immature Granulocytes: 0.06 10*3/uL (ref 0.00–0.07)
Basophils Absolute: 0 10*3/uL (ref 0.0–0.1)
Basophils Relative: 0 %
Eosinophils Absolute: 0 10*3/uL (ref 0.0–0.5)
Eosinophils Relative: 0 %
HCT: 22.3 % — ABNORMAL LOW (ref 36.0–46.0)
Hemoglobin: 6.5 g/dL — ABNORMAL LOW (ref 12.0–15.0)
Immature Granulocytes: 2 %
Lymphocytes Relative: 9 %
Lymphs Abs: 0.2 10*3/uL — ABNORMAL LOW (ref 0.7–4.0)
MCH: 23.3 pg — ABNORMAL LOW (ref 26.0–34.0)
MCHC: 29.1 g/dL — ABNORMAL LOW (ref 30.0–36.0)
MCV: 79.9 fL — ABNORMAL LOW (ref 80.0–100.0)
Monocytes Absolute: 0.1 10*3/uL (ref 0.1–1.0)
Monocytes Relative: 2 %
Neutro Abs: 2.3 10*3/uL (ref 1.7–7.7)
Neutrophils Relative %: 87 %
Platelets: 91 10*3/uL — ABNORMAL LOW (ref 150–400)
RBC: 2.79 MIL/uL — ABNORMAL LOW (ref 3.87–5.11)
RDW: 19.5 % — ABNORMAL HIGH (ref 11.5–15.5)
WBC: 2.7 10*3/uL — ABNORMAL LOW (ref 4.0–10.5)
nRBC: 0 % (ref 0.0–0.2)

## 2020-04-21 LAB — COMPREHENSIVE METABOLIC PANEL
ALT: 11 U/L (ref 0–44)
AST: 20 U/L (ref 15–41)
Albumin: 2.5 g/dL — ABNORMAL LOW (ref 3.5–5.0)
Alkaline Phosphatase: 53 U/L (ref 38–126)
Anion gap: 5 (ref 5–15)
BUN: 16 mg/dL (ref 8–23)
CO2: 22 mmol/L (ref 22–32)
Calcium: 7.4 mg/dL — ABNORMAL LOW (ref 8.9–10.3)
Chloride: 108 mmol/L (ref 98–111)
Creatinine, Ser: 0.73 mg/dL (ref 0.44–1.00)
GFR calc Af Amer: >60 mL/min (ref >60)
GFR calc non Af Amer: >60 mL/min (ref >60)
Glucose, Bld: 134 mg/dL — ABNORMAL HIGH (ref 70–99)
Potassium: 4.7 mmol/L (ref 3.5–5.1)
Sodium: 135 mmol/L (ref 135–145)
Total Bilirubin: 2 mg/dL — ABNORMAL HIGH (ref 0.3–1.2)
Total Protein: 6.3 g/dL — ABNORMAL LOW (ref 6.5–8.1)

## 2020-04-21 LAB — HEMOGLOBIN AND HEMATOCRIT, BLOOD
HCT: 23.2 % — ABNORMAL LOW (ref 36.0–46.0)
HCT: 23.7 % — ABNORMAL LOW (ref 36.0–46.0)
HCT: 27.1 % — ABNORMAL LOW (ref 36.0–46.0)
Hemoglobin: 6.9 g/dL — ABNORMAL LOW (ref 12.0–15.0)
Hemoglobin: 7.4 g/dL — ABNORMAL LOW (ref 12.0–15.0)
Hemoglobin: 8.8 g/dL — ABNORMAL LOW (ref 12.0–15.0)

## 2020-04-21 LAB — C-REACTIVE PROTEIN: CRP: <0.5 mg/dL (ref <1.0)

## 2020-04-21 LAB — URINE DRUG SCREEN, QUALITATIVE (ARMC ONLY)
Amphetamines, Ur Screen: NOT DETECTED
Barbiturates, Ur Screen: NOT DETECTED
Benzodiazepine, Ur Scrn: POSITIVE — AB
Cannabinoid 50 Ng, Ur ~~LOC~~: NOT DETECTED
Cocaine Metabolite,Ur ~~LOC~~: POSITIVE — AB
MDMA (Ecstasy)Ur Screen: NOT DETECTED
Methadone Scn, Ur: NOT DETECTED
Opiate, Ur Screen: NOT DETECTED
Phencyclidine (PCP) Ur S: NOT DETECTED
Tricyclic, Ur Screen: NOT DETECTED

## 2020-04-21 LAB — HEPATITIS B SURFACE ANTIGEN: Hepatitis B Surface Ag: NONREACTIVE

## 2020-04-21 LAB — FERRITIN: Ferritin: 6 ng/mL — ABNORMAL LOW (ref 11–307)

## 2020-04-21 LAB — MAGNESIUM: Magnesium: 2 mg/dL (ref 1.7–2.4)

## 2020-04-21 LAB — PREPARE RBC (CROSSMATCH)

## 2020-04-21 LAB — PHOSPHORUS: Phosphorus: 3.9 mg/dL (ref 2.5–4.6)

## 2020-04-21 LAB — GLUCOSE, CAPILLARY
Glucose-Capillary: 125 mg/dL — ABNORMAL HIGH (ref 70–99)
Glucose-Capillary: 127 mg/dL — ABNORMAL HIGH (ref 70–99)
Glucose-Capillary: 135 mg/dL — ABNORMAL HIGH (ref 70–99)
Glucose-Capillary: 113 mg/dL — ABNORMAL HIGH (ref 70–99)
Glucose-Capillary: 127 mg/dL — ABNORMAL HIGH (ref 70–99)
Glucose-Capillary: 132 mg/dL — ABNORMAL HIGH (ref 70–99)

## 2020-04-21 LAB — FIBRIN DERIVATIVES D-DIMER (ARMC ONLY): Fibrin derivatives D-dimer (ARMC): 6137.44 ng/mL (FEU) — ABNORMAL HIGH (ref 0.00–499.00)

## 2020-04-21 LAB — HIV ANTIBODY (ROUTINE TESTING W REFLEX): HIV Screen 4th Generation wRfx: NONREACTIVE

## 2020-04-21 MED ORDER — FOLIC ACID 1 MG PO TABS
1.0000 mg | ORAL_TABLET | Freq: Every day | ORAL | Status: DC
  Administered 2020-04-26 – 2020-04-28 (×3): 1 mg via ORAL
  Filled 2020-04-21 (×5): qty 1

## 2020-04-21 MED ORDER — LORAZEPAM 1 MG PO TABS: 1.0000 mg | ORAL_TABLET | ORAL | Status: AC | PRN

## 2020-04-21 MED ORDER — CHLORHEXIDINE GLUCONATE CLOTH 2 % EX PADS
6.0000 | MEDICATED_PAD | Freq: Every day | CUTANEOUS | Status: DC
  Administered 2020-04-22 – 2020-04-25 (×4): 6 via TOPICAL

## 2020-04-21 MED ORDER — SODIUM CHLORIDE 0.9% IV SOLUTION: Freq: Once | INTRAVENOUS | Status: DC

## 2020-04-21 MED ORDER — PRO-STAT SUGAR FREE PO LIQD
30.0000 mL | Freq: Two times a day (BID) | ORAL | Status: DC
  Administered 2020-04-24 – 2020-04-26 (×3): 30 mL via ORAL

## 2020-04-21 MED ORDER — THIAMINE HCL 100 MG/ML IJ SOLN
100.0000 mg | Freq: Every day | INTRAMUSCULAR | Status: DC
  Administered 2020-04-21 – 2020-04-22 (×2): 100 mg via INTRAVENOUS
  Filled 2020-04-21 (×2): qty 2

## 2020-04-21 MED ORDER — DIPHENHYDRAMINE HCL 50 MG/ML IJ SOLN
12.5000 mg | Freq: Once | INTRAMUSCULAR | Status: AC
  Administered 2020-04-21: 12.5 mg via INTRAVENOUS
  Filled 2020-04-21: qty 1

## 2020-04-21 MED ORDER — BOOST / RESOURCE BREEZE PO LIQD CUSTOM
1.0000 | Freq: Three times a day (TID) | ORAL | Status: DC
  Administered 2020-04-24 – 2020-04-26 (×4): 1 via ORAL

## 2020-04-21 MED ORDER — THIAMINE HCL 100 MG PO TABS
100.0000 mg | ORAL_TABLET | Freq: Every day | ORAL | Status: DC
  Administered 2020-04-25 – 2020-04-28 (×4): 100 mg via ORAL
  Filled 2020-04-21 (×5): qty 1

## 2020-04-21 MED ORDER — LORAZEPAM 2 MG/ML IJ SOLN
1.0000 mg | INTRAMUSCULAR | Status: AC | PRN
  Administered 2020-04-21: 2 mg via INTRAVENOUS
  Administered 2020-04-21: 3 mg via INTRAVENOUS
  Administered 2020-04-22: 2 mg via INTRAVENOUS
  Administered 2020-04-22: 3 mg via INTRAVENOUS
  Administered 2020-04-22: 2 mg via INTRAVENOUS
  Administered 2020-04-22 – 2020-04-23 (×7): 3 mg via INTRAVENOUS
  Filled 2020-04-21 (×4): qty 2
  Filled 2020-04-21 (×2): qty 1
  Filled 2020-04-21 (×6): qty 2
  Filled 2020-04-21: qty 1

## 2020-04-21 MED ORDER — ZIPRASIDONE MESYLATE 20 MG IM SOLR: 20.0000 mg | Freq: Two times a day (BID) | INTRAMUSCULAR | Status: DC

## 2020-04-21 NOTE — Progress Notes (Signed)
Initial Nutrition Assessment  DOCUMENTATION CODES:   Not applicable  INTERVENTION:  Boost Breeze po TID, each supplement provides 250 kcal and 9 grams of protein Prostat 30 ml po BID, each supplement provides 100 kcal and 15 grams of protein Monitor for diet advancement  NUTRITION DIAGNOSIS:   Increased nutrient needs related to catabolic illness(Pneumonia secondary to COVID-19 virus infection) as evidenced by estimated needs.  GOAL:   Patient will meet greater than or equal to 90% of their needs  MONITOR:   PO intake, Diet advancement, Labs, Supplement acceptance, Weight trends, I & O's, TF tolerance  REASON FOR ASSESSMENT:   Consult Assessment of nutrition requirement/status  ASSESSMENT:  RD working remotely.  64 year old female with history of COPD, PCM, hepatitis C, HTN, IV drug abuse, and recent loss of significant other on Wednesday secondary to complications form COVID, admitted for COVID pneumonia and severe anemia.  Per chart review, Hgb 2.3 in ED, melena and hemoccult positive, massive transfusion protocol initiated, GI consulted. She has a history of crack cocaine, reports last using yesterday. Upper endoscopy to be determined based on hemodynamic stability, system is cleared of cocaine, and recovers from PNA, Transfuse to reach Hgb greater than 7.  Patient diet advanced to clears, no documented intakes at this time. Will provide Boost Breeze and Prostat supplements to aid with meeting needs.   Current wt 139.7 lbs No recent wt history for review, on 02/20/19 she weighed 138.16 lbs  Medications reviewed and include: Vit C, B complex with C, Decadron, Valium, Folic acid, SSI, Levemir, Stromectol, Melatonin, B1, Zinc sulfate IVF: Lactated ringers IVPB: Rocephin, Remdesivir Precedex 1 mcg Labs: CBGs 127,125, Hgb 6.5 (L) K/Mg/P - WNL  NUTRITION - FOCUSED PHYSICAL EXAM: Unable to complete at this time, RD working remotely.  Diet Order:   Diet Order           Diet clear liquid Room service appropriate? Yes; Fluid consistency: Thin  Diet effective now              EDUCATION NEEDS:   No education needs have been identified at this time  Skin:  Skin Assessment: Reviewed RN Assessment  Last BM:  5/14  Height:   Ht Readings from Last 1 Encounters:  04/20/20 5\' 2"  (1.575 m)    Weight:   Wt Readings from Last 1 Encounters:  04/20/20 63.5 kg    Ideal Body Weight:  50 kg  BMI:  Body mass index is 25.61 kg/m.  Estimated Nutritional Needs:   Kcal:  04/22/20  Protein:  95-105  Fluid:  >/= 1.8 L/day   [id number], RD, LDN Clinical Nutrition After Hours/Weekend Pager # in Amion

## 2020-04-21 NOTE — Progress Notes (Signed)
PHARMACY - PHYSICIAN COMMUNICATION CRITICAL VALUE ALERT - BLOOD CULTURE IDENTIFICATION (BCID)  Kylie Monroe is an 64 y.o. female who presented to [facility] on 04/20/2020 with a chief complaint of COVID-19 infection + severe anemia  Assessment:  1/4 bottles GPC. BCID detected streptococcus spp. Patient has history of IVDU. Result could reflect contamination.   Name of physician (or Provider) Contacted: Dr. Belia Heman  Current antibiotics: Ceftriaxone 2 g q24h  Changes to prescribed antibiotics recommended:  No changes recommended ; patient will continue on current therapy  Results for orders placed or performed during the hospital encounter of 04/20/20  Blood Culture ID Panel (Reflexed) (Collected: 04/20/2020 11:54 AM)  Result Value Ref Range   Enterococcus species NOT DETECTED NOT DETECTED   Listeria monocytogenes NOT DETECTED NOT DETECTED   Staphylococcus species NOT DETECTED NOT DETECTED   Staphylococcus aureus (BCID) NOT DETECTED NOT DETECTED   Streptococcus species DETECTED (A) NOT DETECTED   Streptococcus agalactiae NOT DETECTED NOT DETECTED   Streptococcus pneumoniae NOT DETECTED NOT DETECTED   Streptococcus pyogenes NOT DETECTED NOT DETECTED   Acinetobacter baumannii NOT DETECTED NOT DETECTED   Enterobacteriaceae species NOT DETECTED NOT DETECTED   Enterobacter cloacae complex NOT DETECTED NOT DETECTED   Escherichia coli NOT DETECTED NOT DETECTED   Klebsiella oxytoca NOT DETECTED NOT DETECTED   Klebsiella pneumoniae NOT DETECTED NOT DETECTED   Proteus species NOT DETECTED NOT DETECTED   Serratia marcescens NOT DETECTED NOT DETECTED   Haemophilus influenzae NOT DETECTED NOT DETECTED   Neisseria meningitidis NOT DETECTED NOT DETECTED   Pseudomonas aeruginosa NOT DETECTED NOT DETECTED   Candida albicans NOT DETECTED NOT DETECTED   Candida glabrata NOT DETECTED NOT DETECTED   Candida krusei NOT DETECTED NOT DETECTED   Candida parapsilosis NOT DETECTED NOT DETECTED   Candida  tropicalis NOT DETECTED NOT DETECTED    Tressie Ellis  Pharmacy Resident 04/21/2020  12:46 PM

## 2020-04-21 NOTE — Progress Notes (Signed)
CRITICAL CARE NOTE 64 y.o.femalewith a known history of COPD, protein calorie malnutrition, hepatitis C, hypertension, IV drug abuse +  COVID pneumonia and severe anemia  -Patient's boyfriend/significant otherpassed away on Wednesday this week at [facility] (he was admitted there for COVID and died of complication, she is not coping well withthis.   -She did smoke crack on Wednesdayper records.   patient had cocaine use this morning. She did report black stools.Patient c/o severe headache and chest pain and requesting pain meds.  5/14 admitted for severe anemia, COVID 19 pneumonia and COCAINE ABUSE 5/15 severe encepphalopathy, high risk for intubation, on precedex   CC  follow up encephalopathy and COVID 19 pneumonia  SUBJECTIVE Patient remains critically ill Prognosis is guarded On precedex High risk for intubation and death   BP (!) 93/55   Pulse 62   Temp (!) 96.8 F (36 C)   Resp 13   Ht 5\' 2"  (1.575 m)   Wt 63.5 kg   SpO2 100%   BMI 25.61 kg/m    I/O last 3 completed shifts: In: 800 [Blood:700; IV Piggyback:100] Out: 450 [Urine:450] No intake/output data recorded.  SpO2: 100 % O2 Flow Rate (L/min): 3 L/min  Estimated body mass index is 25.61 kg/m as calculated from the following:   Height as of this encounter: 5\' 2"  (1.575 m).   Weight as of this encounter: 63.5 kg.  SIGNIFICANT EVENTS   REVIEW OF SYSTEMS  PATIENT IS UNABLE TO PROVIDE COMPLETE REVIEW OF SYSTEMS DUE TO SEVERE CRITICAL ILLNESS        PHYSICAL EXAMINATION:  GENERAL:critically ill appearing, +resp distress HEAD: Normocephalic, atraumatic.  EYES: Pupils equal, round, reactive to light.  No scleral icterus.  MOUTH: Moist mucosal membrane. NECK: Supple.  PULMONARY: +rhonchi, +wheezing CARDIOVASCULAR: S1 and S2. Regular rate and rhythm. No murmurs, rubs, or gallops.  GASTROINTESTINAL: Soft, nontender, -distended.  Positive bowel sounds.   MUSCULOSKELETAL: No swelling,  clubbing, or edema.  NEUROLOGIC: obtunded, SKIN:intact,warm,dry  MEDICATIONS: I have reviewed all medications and confirmed regimen as documented   CULTURE RESULTS   Recent Results (from the past 240 hour(s))  MRSA PCR Screening     Status: Abnormal   Collection Time: 04/20/20 11:27 AM  Result Value Ref Range Status   MRSA by PCR POSITIVE (A) NEGATIVE Final    Comment:        The GeneXpert MRSA Assay (FDA approved for NASAL specimens only), is one component of a comprehensive MRSA colonization surveillance program. It is not intended to diagnose MRSA infection nor to guide or monitor treatment for MRSA infections. CRITICAL RESULT CALLED TO, READ BACK BY AND VERIFIED WITH: MIRANBA ROBLES @1921  04/20/2020 TTG Performed at [facility], [address] [place], [place] [address]   Blood Culture (routine x 2)     Status: None (Preliminary result)   Collection Time: 04/20/20 11:54 AM   Specimen: BLOOD  Result Value Ref Range Status   Specimen Description BLOOD LT [facility]  Final   Special Requests   Final    BOTTLES DRAWN AEROBIC AND ANAEROBIC Blood Culture results may not be optimal due to an excessive volume of blood received in culture bottles   Culture   Final    NO GROWTH < 24 HOURS Performed at [facility], [address], [place], [place] [address]    Report Status PENDING  Incomplete  Blood Culture (routine x 2)     Status: None (Preliminary result)   Collection Time: 04/20/20 11:54 AM  Specimen: BLOOD  Result Value Ref Range Status   Specimen Description BLOOD RT [facility]  Final   Special Requests   Final    BOTTLES DRAWN AEROBIC AND ANAEROBIC Blood Culture adequate volume   Culture   Final    NO GROWTH < 24 HOURS Performed at [facility], [address] [place], [place] [address]    Report Status PENDING  Incomplete  SARS Coronavirus 2 by RT PCR (hospital order, performed in [facility] hospital lab) Nasopharyngeal Nasopharyngeal Swab      Status: Abnormal   Collection Time: 04/20/20 12:57 PM   Specimen: Nasopharyngeal Swab  Result Value Ref Range Status   SARS Coronavirus 2 POSITIVE (A) NEGATIVE Final    Comment: RESULT CALLED TO, READ BACK BY AND VERIFIED WITH:  LEXIE OLIVER AT 1416 04/20/20 (NOTE) SARS-CoV-2 target nucleic acids are DETECTED SARS-CoV-2 RNA is generally detectable in upper respiratory specimens  during the acute phase of infection.  Positive results are indicative  of the presence of the identified virus, but do not rule out bacterial infection or co-infection with other pathogens not detected by the test.  Clinical correlation with patient history and  other diagnostic information is necessary to determine patient infection status.  The expected result is negative. Fact Sheet for Patients:   BoilerBrush.com.cy  Fact Sheet for Healthcare Providers:   https://pope.com/   This test is not yet approved or cleared by the [place] FDA and  has been authorized for detection and/or diagnosis of SARS-CoV-2 by FDA under an Emergency Use Authorization (EUA).  This EUA will remain in effect (meaning this test can be use d) for the duration of  the COVID-19 declaration under Section 564(b)(1) of the Act, 21 U.S.C. section 360-bbb-3(b)(1), unless the authorization is terminated or revoked sooner. Performed at [facility], [address] [place], [place] [address]           IMAGING    DG Chest 2 View  Result Date: 04/20/2020 CLINICAL DATA:  Chest pain and generalize pitting edema EXAM: CHEST - 2 VIEW COMPARISON:  February 19, 2019 FINDINGS: There is ill-defined airspace opacity bilaterally, somewhat more severe in the right upper lobe than elsewhere. No consolidation. Areas of prior consolidation on the left if cleared. Heart is mildly enlarged with pulmonary vascularity normal. No adenopathy. There is evidence of an old healed fracture of the  lateral left clavicle. There is underlying osteoporosis. IMPRESSION: Compared to prior study, there has been clearing of consolidation on the left. No ever, there is more ill-defined airspace opacity bilaterally, most notably in the right upper lobe, likely representing multifocal pneumonia. Suspect atypical organism etiology. Heart is mildly enlarged with pulmonary vascularity within normal limits. No adenopathy demonstrable by radiography. Bones osteoporotic. Electronically Signed   By: Bretta Bang III M.D.   On: 04/20/2020 11:42     Nutrition Status: Nutrition Problem: Increased nutrient needs Etiology: catabolic illness(Pneumonia secondary to COVID-19 virus infection) Signs/Symptoms: estimated needs Interventions: Tube feeding, Boost Breeze, MVI  CBC    Component Value Date/Time   WBC 2.7 (L) 04/21/2020 0348   RBC 2.79 (L) 04/21/2020 0348   HGB 6.5 (L) 04/21/2020 0348   HGB 13.7 03/13/2014 1603   HCT 22.3 (L) 04/21/2020 0348   HCT 41.7 03/13/2014 1603   PLT 91 (L) 04/21/2020 0348   PLT 100 (L) 03/13/2014 1603   MCV 79.9 (L) 04/21/2020 0348   MCV 102 (H) 03/13/2014 1603   MCH 23.3 (L) 04/21/2020 4403  MCHC 29.1 (L) 04/21/2020 0348   RDW 19.5 (H) 04/21/2020 0348   RDW 15.1 (H) 03/13/2014 1603   LYMPHSABS 0.2 (L) 04/21/2020 0348   MONOABS 0.1 04/21/2020 0348   EOSABS 0.0 04/21/2020 0348   BASOSABS 0.0 04/21/2020 0348   BMP Latest Ref Rng & Units 04/21/2020 04/20/2020 04/20/2020  Glucose 70 - 99 mg/dL 563(O) 756(E) 332(R)  BUN 8 - 23 mg/dL 16 17 17   Creatinine 0.44 - 1.00 mg/dL 5.18 8.41  Sodium 135 - 145 mmol/L 135 132(L) 132(L)  Potassium 3.5 - 5.1 mmol/L 4.7 4.4 4.0  Chloride 98 - 111 mmol/L 108 104 103  CO2 22 - 32 mmol/L 22 24 23   Calcium 8.9 - 10.3 mg/dL 7.4(L) 7.5(L) 7.5(L)       ASSESSMENT AND PLAN SYNOPSIS  FACIAL SWELLING AND COCAINE ABUSE WITH COVID 19 PNEUMONIA WITH SEVERE ANEMIA FROM GIB  SEVERE COPD EXACERBATION -continue IV steroids as  prescribed -morphine as needed -wean fio2 as needed and tolerated  Severe COVID-19 infection,  pneumonia/pneumonitis IV steroids  IV remdisivir Aggressive pulm toilet recommended Pulmonary hygiene Maintain airborne and contact precautions  As needed bronchodilators (MDI) Vitamin C and zinc Antitussives High risk for intubation and death    NEUROLOGY Acute toxic metabolic encephalopathy, need for sedation On precedex   CARDIAC ICU monitoring  ID -continue IV abx as prescibed -follow up cultures  GI GI PROPHYLAXIS as indicated  NUTRITIONAL STATUS Nutrition Status: Nutrition Problem: Increased nutrient needs Etiology: catabolic illness(Pneumonia secondary to COVID-19 virus infection) Signs/Symptoms: estimated needs Interventions: Tube feeding, Boost Breeze, MVI   DIET-->NPO Constipation protocol as indicated  ENDO - will use ICU hypoglycemic\Hyperglycemia protocol if indicated     ELECTROLYTES -follow labs as needed -replace as needed -pharmacy consultation and following   DVT/GI PRX ordered and assessed TRANSFUSIONS AS NEEDED MONITOR FSBS I Assessed the need for Labs I Assessed the need for Foley I Assessed the need for Central Venous Line Family Discussion when available I Assessed the need for Mobilization I made an Assessment of medications to be adjusted accordingly Safety Risk assessment completed   CASE DISCUSSED IN MULTIDISCIPLINARY ROUNDS WITH ICU TEAM  Critical Care Time devoted to patient care services described in this note is 34 minutes.   Overall, patient is critically ill, prognosis is guarded.  Patient with Multiorgan failure and at high risk for cardiac arrest and death.    6.60, M.D.  Pulmonary & Critical Care Medicine  Medical Director [facility] Medical Director [facility] Cardio-Pulmonary Department

## 2020-04-21 NOTE — Progress Notes (Signed)
Pharmacy Electrolyte Monitoring Consult:  Pharmacy consulted to assist in monitoring and replacing electrolytes in this 64 y.o. female admitted on 04/20/2020 with Facial Swelling, Chest Pain, and Extremity Swelling  Labs:  Sodium (mmol/L)  Date Value  04/21/2020 135  03/13/2014 136   Potassium (mmol/L)  Date Value  04/21/2020 4.7  03/13/2014 3.7   Magnesium (mg/dL)  Date Value  97/18/2099 2.0   Phosphorus (mg/dL)  Date Value  06/89/3406 3.9   Calcium (mg/dL)  Date Value  84/02/3532 7.4 (L)   Calcium, Total (mg/dL)  Date Value  17/40/9927 8.3 (L)   Albumin (g/dL)  Date Value  80/03/4714 2.5 (L)  03/14/2014 2.3 (L)   Corrected Ca: 8.6 mg/dL  Assessment/Plan:  Electrolytes:   patient receiving LR at 119mL/hr  No further replacement warranted  Will obtain all electrolytes with a labs   Glucose:   patient on dexamethazone 10mg  IV Daily (day #2)   No SSI warranted  Will follow with daily labs and add as warranted.   Pharmacy will continue to monitor and adjust per consult.   04/21/2020 7:38 AM

## 2020-04-21 NOTE — Progress Notes (Signed)
Dr Belia Heman notified via Secure chat of blood culture positive for gram positive cocci per report.  Pt has 4 PIV working well at this time and adequate for IV needs.  Recommendation to cancel PICC order until [facility] negative x 48 hrs, place CVC if necessary if more access needed. [place] RN also notified. Will hold on PICC placement until further notice.

## 2020-04-22 LAB — CBC WITH DIFFERENTIAL/PLATELET
Abs Immature Granulocytes: 0.03 10*3/uL (ref 0.00–0.07)
Basophils Absolute: 0 10*3/uL (ref 0.0–0.1)
Basophils Relative: 0 %
Eosinophils Absolute: 0 10*3/uL (ref 0.0–0.5)
Eosinophils Relative: 1 %
HCT: 27 % — ABNORMAL LOW (ref 36.0–46.0)
Hemoglobin: 8.5 g/dL — ABNORMAL LOW (ref 12.0–15.0)
Immature Granulocytes: 1 %
Lymphocytes Relative: 11 %
Lymphs Abs: 0.6 10*3/uL — ABNORMAL LOW (ref 0.7–4.0)
MCH: 24.7 pg — ABNORMAL LOW (ref 26.0–34.0)
MCHC: 31.5 g/dL (ref 30.0–36.0)
MCV: 78.5 fL — ABNORMAL LOW (ref 80.0–100.0)
Monocytes Absolute: 0.3 10*3/uL (ref 0.1–1.0)
Monocytes Relative: 6 %
Neutro Abs: 4.3 10*3/uL (ref 1.7–7.7)
Neutrophils Relative %: 81 %
Platelets: 103 10*3/uL — ABNORMAL LOW (ref 150–400)
RBC: 3.44 MIL/uL — ABNORMAL LOW (ref 3.87–5.11)
RDW: 19.6 % — ABNORMAL HIGH (ref 11.5–15.5)
WBC: 5.2 10*3/uL (ref 4.0–10.5)
nRBC: 0.8 % — ABNORMAL HIGH (ref 0.0–0.2)

## 2020-04-22 LAB — TYPE AND SCREEN
ABO/RH(D): O POS
Antibody Screen: NEGATIVE
Unit division: 0
Unit division: 0
Unit division: 0
Unit division: 0
Unit division: 0

## 2020-04-22 LAB — GLUCOSE, CAPILLARY
Glucose-Capillary: 101 mg/dL — ABNORMAL HIGH (ref 70–99)
Glucose-Capillary: 105 mg/dL — ABNORMAL HIGH (ref 70–99)
Glucose-Capillary: 106 mg/dL — ABNORMAL HIGH (ref 70–99)
Glucose-Capillary: 106 mg/dL — ABNORMAL HIGH (ref 70–99)
Glucose-Capillary: 118 mg/dL — ABNORMAL HIGH (ref 70–99)
Glucose-Capillary: 121 mg/dL — ABNORMAL HIGH (ref 70–99)
Glucose-Capillary: 96 mg/dL (ref 70–99)
Glucose-Capillary: 97 mg/dL (ref 70–99)

## 2020-04-22 LAB — HEMOGLOBIN AND HEMATOCRIT, BLOOD
HCT: 28.2 % — ABNORMAL LOW (ref 36.0–46.0)
HCT: 30.2 % — ABNORMAL LOW (ref 36.0–46.0)
HCT: 31.3 % — ABNORMAL LOW (ref 36.0–46.0)
HCT: 29.4 % — ABNORMAL LOW (ref 36.0–46.0)
Hemoglobin: 8.5 g/dL — ABNORMAL LOW (ref 12.0–15.0)
Hemoglobin: 8.9 g/dL — ABNORMAL LOW (ref 12.0–15.0)
Hemoglobin: 8.7 g/dL — ABNORMAL LOW (ref 12.0–15.0)
Hemoglobin: 9.1 g/dL — ABNORMAL LOW (ref 12.0–15.0)

## 2020-04-22 LAB — BPAM RBC
Blood Product Expiration Date: 202105182359
Blood Product Expiration Date: 202105302359
Blood Product Expiration Date: 202106082359
Blood Product Expiration Date: 202106082359
Blood Product Expiration Date: 202106092359
ISSUE DATE / TIME: 202105141407
ISSUE DATE / TIME: 202105141407
ISSUE DATE / TIME: 202105150042
ISSUE DATE / TIME: 202105150240
ISSUE DATE / TIME: 202105151456
Unit Type and Rh: 5100
Unit Type and Rh: 5100
Unit Type and Rh: 5100
Unit Type and Rh: 5100
Unit Type and Rh: 5100

## 2020-04-22 LAB — COMPREHENSIVE METABOLIC PANEL
ALT: 10 U/L (ref 0–44)
AST: 16 U/L (ref 15–41)
Albumin: 2.5 g/dL — ABNORMAL LOW (ref 3.5–5.0)
Alkaline Phosphatase: 51 U/L (ref 38–126)
Anion gap: 7 (ref 5–15)
BUN: 22 mg/dL (ref 8–23)
CO2: 22 mmol/L (ref 22–32)
Calcium: 7.5 mg/dL — ABNORMAL LOW (ref 8.9–10.3)
Chloride: 109 mmol/L (ref 98–111)
Creatinine, Ser: 0.85 mg/dL (ref 0.44–1.00)
GFR calc Af Amer: >60 mL/min (ref >60)
GFR calc non Af Amer: >60 mL/min (ref >60)
Glucose, Bld: 102 mg/dL — ABNORMAL HIGH (ref 70–99)
Potassium: 4.5 mmol/L (ref 3.5–5.1)
Sodium: 138 mmol/L (ref 135–145)
Total Bilirubin: 0.8 mg/dL (ref 0.3–1.2)
Total Protein: 6.1 g/dL — ABNORMAL LOW (ref 6.5–8.1)

## 2020-04-22 LAB — MAGNESIUM: Magnesium: 2.1 mg/dL (ref 1.7–2.4)

## 2020-04-22 LAB — FIBRIN DERIVATIVES D-DIMER (ARMC ONLY): Fibrin derivatives D-dimer (ARMC): >7500 ng/mL (FEU) — ABNORMAL HIGH (ref 0.00–499.00)

## 2020-04-22 LAB — C-REACTIVE PROTEIN: CRP: 0.6 mg/dL (ref <1.0)

## 2020-04-22 LAB — FERRITIN: Ferritin: 18 ng/mL (ref 11–307)

## 2020-04-22 LAB — PHOSPHORUS: Phosphorus: 3.5 mg/dL (ref 2.5–4.6)

## 2020-04-22 MED ORDER — ZIPRASIDONE MESYLATE 20 MG IM SOLR
20.0000 mg | Freq: Two times a day (BID) | INTRAMUSCULAR | Status: DC | PRN
  Administered 2020-04-26 – 2020-04-29 (×3): 20 mg via INTRAMUSCULAR
  Filled 2020-04-22 (×4): qty 20

## 2020-04-22 MED ORDER — SODIUM CHLORIDE 0.9 % IV BOLUS: 1000.0000 mL | Freq: Once | INTRAVENOUS | Status: DC

## 2020-04-22 MED ORDER — NOREPINEPHRINE 4 MG/250ML-% IV SOLN: 2.0000 ug/min | INTRAVENOUS | Status: DC

## 2020-04-22 MED ORDER — SODIUM CHLORIDE 0.9 % IV SOLN: 250.0000 mL | INTRAVENOUS | Status: DC

## 2020-04-22 NOTE — Progress Notes (Signed)
CRITICAL CARE NOTE 64 y.o.femalewith a known history of COPD, protein calorie malnutrition, hepatitis C, hypertension, IV drug abuse +COVID pneumonia and severe anemia  -Patient's [facility]/significant [facility]passed [facility] (he was admitted there for COVID and died of complication, she is not coping well withthis.   -She did smoke crack on Wednesdayper records.  patient had cocaine use this morning. She did report black stools.Patient c/o severe headache and chest pain and requesting pain meds.  5/14 admitted for severe anemia, COVID 19 pneumonia and COCAINE ABUSE 5/15 severe encepphalopathy, high risk for intubation, on precedex 5/16 severe encephalopathy, high risk for intubation on precedex 5/16 +BLOOD CULTURES STREPTOCOCCUS SPECIES  CC  follow up DT's COVID 19 penumonia   HPI Patient remains critically ill Prognosis is guarded Severe toxic metabolic encephalopathy    BP 125/69   Pulse (!) 56   Temp 97.9 F (36.6 C) (Axillary)   Resp 14   Ht 5\' 2"  (1.575 m)   Wt 63.5 kg   SpO2 100%   BMI 25.61 kg/m    I/O last 3 completed shifts: In: 3709.8 [I.V.:2929; Blood:680; IV Piggyback:100.8] Out: 700 [Urine:700] No intake/output data recorded.  SpO2: 100 % O2 Flow Rate (L/min): 3 L/min FiO2 (%): (!) 5 %  Estimated body mass index is 25.61 kg/m as calculated from the following:   Height as of this encounter: 5\' 2"  (1.575 m).   Weight as of this encounter: 63.5 kg.  SIGNIFICANT EVENTS   REVIEW OF SYSTEMS  PATIENT IS UNABLE TO PROVIDE COMPLETE REVIEW OF SYSTEMS DUE TO SEVERE CRITICAL ILLNESS        PHYSICAL EXAMINATION:  GENERAL:critically ill appearing,  HEAD: Normocephalic, atraumatic.  EYES: Pupils equal, round, reactive to light.  No scleral icterus.  MOUTH: Moist mucosal membrane. NECK: Supple.  PULMONARY: +rhonchi, +wheezing CARDIOVASCULAR: S1 and S2. Regular rate and rhythm. No murmurs, rubs, or gallops.   GASTROINTESTINAL: Soft, nontender, -distended.  Positive bowel sounds.   MUSCULOSKELETAL: No swelling, clubbing, or edema.  NEUROLOGIC: obtunded, GCS<8 SKIN:intact,warm,dry  MEDICATIONS: I have reviewed all medications and confirmed regimen as documented   CULTURE RESULTS   Recent Results (from the past 240 hour(s))  MRSA PCR Screening     Status: Abnormal   Collection Time: 04/20/20 11:27 AM  Result Value Ref Range Status   MRSA by PCR POSITIVE (A) NEGATIVE Final    Comment:        The GeneXpert MRSA Assay (FDA approved for NASAL specimens only), is one component of a comprehensive MRSA colonization surveillance program. It is not intended to diagnose MRSA infection nor to guide or monitor treatment for MRSA infections. CRITICAL RESULT CALLED TO, READ BACK BY AND VERIFIED WITH: MIRANBA ROBLES @1921  04/20/2020 TTG Performed at [facility], [address] [place], [place] [address]   Blood Culture (routine x 2)     Status: None (Preliminary result)   Collection Time: 04/20/20 11:54 AM   Specimen: BLOOD  Result Value Ref Range Status   Specimen Description   Final    BLOOD LT [facility] Performed at [facility], [address], [place], [place] [address]    Special Requests   Final    BOTTLES DRAWN AEROBIC AND ANAEROBIC Blood Culture results may not be optimal due to an excessive volume of blood received in culture bottles Performed at [facility], [address] [place], [place] [address]    Culture  Setup Time   Final    Organism  ID to follow GRAM POSITIVE COCCI IN BOTH AEROBIC AND ANAEROBIC BOTTLES CRITICAL RESULT CALLED TO, READ BACK BY AND VERIFIED WITH: ALEX CHAPPELL ON 04/21/2020 AT 1231 TIK Performed at [facility], [address], [place], [place] [address]    Culture   Final    GRAM POSITIVE COCCI CULTURE REINCUBATED FOR BETTER GROWTH Performed at [facility], [address], [place], [place] [address]     Report Status PENDING  Incomplete  Blood Culture (routine x 2)     Status: None (Preliminary result)   Collection Time: 04/20/20 11:54 AM   Specimen: BLOOD  Result Value Ref Range Status   Specimen Description BLOOD RT [facility]  Final   Special Requests   Final    BOTTLES DRAWN AEROBIC AND ANAEROBIC Blood Culture adequate volume   Culture   Final    NO GROWTH 2 DAYS Performed at [facility], [address] [place], [place] [address]    Report Status PENDING  Incomplete  Blood Culture ID Panel (Reflexed)     Status: Abnormal   Collection Time: 04/20/20 11:54 AM  Result Value Ref Range Status   Enterococcus species NOT DETECTED NOT DETECTED Final   Listeria monocytogenes NOT DETECTED NOT DETECTED Final   Staphylococcus species NOT DETECTED NOT DETECTED Final   Staphylococcus aureus (BCID) NOT DETECTED NOT DETECTED Final   Streptococcus species DETECTED (A) NOT DETECTED Final    Comment: Not Enterococcus species, Streptococcus agalactiae, Streptococcus pyogenes, or Streptococcus pneumoniae. CRITICAL RESULT CALLED TO, READ BACK BY AND VERIFIED WITH: ALEX CHAPPELL ON 04/21/2020 AT 1231 TIK    Streptococcus agalactiae NOT DETECTED NOT DETECTED Final   Streptococcus pneumoniae NOT DETECTED NOT DETECTED Final   Streptococcus pyogenes NOT DETECTED NOT DETECTED Final   Acinetobacter baumannii NOT DETECTED NOT DETECTED Final   Enterobacteriaceae species NOT DETECTED NOT DETECTED Final   Enterobacter cloacae complex NOT DETECTED NOT DETECTED Final   Escherichia coli NOT DETECTED NOT DETECTED Final   Klebsiella oxytoca NOT DETECTED NOT DETECTED Final   Klebsiella pneumoniae NOT DETECTED NOT DETECTED Final   Proteus species NOT DETECTED NOT DETECTED Final   Serratia marcescens NOT DETECTED NOT DETECTED Final   Haemophilus influenzae NOT DETECTED NOT DETECTED Final   Neisseria meningitidis NOT DETECTED NOT DETECTED Final   Pseudomonas aeruginosa NOT DETECTED NOT DETECTED Final   Candida  albicans NOT DETECTED NOT DETECTED Final   Candida glabrata NOT DETECTED NOT DETECTED Final   Candida krusei NOT DETECTED NOT DETECTED Final   Candida parapsilosis NOT DETECTED NOT DETECTED Final   Candida tropicalis NOT DETECTED NOT DETECTED Final    Comment: Performed at [facility], [address] [place], [place] [address]  SARS Coronavirus 2 by RT PCR (hospital order, performed in [facility] hospital lab) Nasopharyngeal Nasopharyngeal Swab     Status: Abnormal   Collection Time: 04/20/20 12:57 PM   Specimen: Nasopharyngeal Swab  Result Value Ref Range Status   SARS Coronavirus 2 POSITIVE (A) NEGATIVE Final    Comment: RESULT CALLED TO, READ BACK BY AND VERIFIED WITH:  LEXIE OLIVER AT 1416 04/20/20 (NOTE) SARS-CoV-2 target nucleic acids are DETECTED SARS-CoV-2 RNA is generally detectable in upper respiratory specimens  during the acute phase of infection.  Positive results are indicative  of the presence of the identified virus, but do not rule out bacterial infection or co-infection with other pathogens not detected by the test.  Clinical correlation with patient history and  other diagnostic information is  necessary to determine patient infection status.  The expected result is negative. Fact Sheet for Patients:   BoilerBrush.com.cy  Fact Sheet for Healthcare Providers:   https://pope.com/   This test is not yet approved or cleared by the [place] FDA and  has been authorized for detection and/or diagnosis of SARS-CoV-2 by FDA under an Emergency Use Authorization (EUA).  This EUA will remain in effect (meaning this test can be use d) for the duration of  the COVID-19 declaration under Section 564(b)(1) of the Act, 21 U.S.C. section 360-bbb-3(b)(1), unless the authorization is terminated or revoked sooner. Performed at [facility], [address] [place], [place] [address]         CBC     Component Value Date/Time   WBC 5.2 04/22/2020 0321   RBC 3.44 (L) 04/22/2020 0321   HGB 8.5 (L) 04/22/2020 0726   HGB 13.7 03/13/2014 1603   HCT 28.2 (L) 04/22/2020 0726   HCT 41.7 03/13/2014 1603   PLT 103 (L) 04/22/2020 0321   PLT 100 (L) 03/13/2014 1603   MCV 78.5 (L) 04/22/2020 0321   MCV 102 (H) 03/13/2014 1603   MCH 24.7 (L) 04/22/2020 0321   MCHC 31.5 04/22/2020 0321   RDW 19.6 (H) 04/22/2020 0321   RDW 15.1 (H) 03/13/2014 1603   LYMPHSABS 0.6 (L) 04/22/2020 0321   MONOABS 0.3 04/22/2020 0321   EOSABS 0.0 04/22/2020 0321   BASOSABS 0.0 04/22/2020 0321   BMP Latest Ref Rng & Units 04/22/2020 04/21/2020 04/20/2020  Glucose 70 - 99 mg/dL 223(V) 612(A) 449(P)  BUN 8 - 23 mg/dL 22 16 17   Creatinine 0.44 - 1.00 mg/dL 5.30 0.51  Sodium 135 - 145 mmol/L 138 135 132(L)  Potassium 3.5 - 5.1 mmol/L 4.5 4.7 4.4  Chloride 98 - 111 mmol/L 109 108 104  CO2 22 - 32 mmol/L 22 22 24   Calcium 8.9 - 10.3 mg/dL 7.5(L) 7.4(L) 7.5(L)     IMAGING    1.02 EKG SITE RITE  Result Date: 04/21/2020 If Site Rite image not attached, placement could not be confirmed due to current cardiac rhythm.    Nutrition Status: Nutrition Problem: Increased nutrient needs Etiology: catabolic illness(Pneumonia secondary to COVID-19 virus infection) Signs/Symptoms: estimated needs Interventions: Tube feeding, Boost Breeze, MVI     Indwelling Urinary Catheter continued, requirement due to   Reason to continue Indwelling Urinary Catheter strict Intake/Output monitoring for hemodynamic instability     ASSESSMENT AND PLAN SYNOPSIS COCAINE ABUSE WITH COVID 19 PNEUMONIA WITH SEVERE ANEMIA FROM GIB COMPLICATED BY SEVERE DT's IN THE SETTING OF CHRONIC HYPOXIC RESP FAILURE FROM COPD EXACERBATION DUE TO STREP SPECIES BACTERMIA  Severe ACUTE Hypoxic and Hypercapnic Respiratory Failure High risk for aspiration and high risk for intubation    SEVERE ALCOHOL WITHDRAWAL -Therapy with Thiamine and  MVI -CIWA Protocol -Precedex as needed -High risk for intubation  -high risk for aspiration    NEUROLOGY Acute toxic metabolic encephalopathy, need for sedation With precedex   CARDIAC ICU monitoring  ID -continue IV abx as prescibed -follow up cultures STREP SPECIES  GI GI PROPHYLAXIS as indicated  NUTRITIONAL STATUS Nutrition Status: Nutrition Problem: Increased nutrient needs Etiology: catabolic illness(Pneumonia secondary to COVID-19 virus infection) Signs/Symptoms: estimated needs Interventions: Tube feeding, Boost Breeze, MVI   DIET-->NPO   ENDO - will use ICU hypoglycemic\Hyperglycemia protocol if indicated     ELECTROLYTES -follow labs as needed -replace as needed -pharmacy consultation and following   DVT/GI PRX ordered and assessed TRANSFUSIONS AS  NEEDED MONITOR FSBS I Assessed the need for Labs I Assessed the need for Foley I Assessed the need for Central Venous Line Family Discussion when available I Assessed the need for Mobilization I made an Assessment of medications to be adjusted accordingly Safety Risk assessment completed   CASE DISCUSSED IN MULTIDISCIPLINARY ROUNDS WITH ICU TEAM  Critical Care Time devoted to patient care services described in this note is 39 minutes.   Overall, patient is critically ill, prognosis is guarded.  Patient with Multiorgan failure and at high risk for cardiac arrest and death.   RECOMMEND DNR/DNI STATUS RECOMMEND PALLIATIVE CARE CONSULTATION  Khamila Bassinger Santiago Glad, M.D.  [facility]  Medical Director [facility] Medical Director [facility] Cardio-Pulmonary Department

## 2020-04-22 NOTE — Progress Notes (Signed)
Pharmacy Electrolyte Monitoring Consult:  Pharmacy consulted to assist in monitoring and replacing electrolytes in this 64 y.o. female admitted on 04/20/2020 with Facial Swelling, Chest Pain, and Extremity Swelling  Labs:  Sodium (mmol/L)  Date Value  04/22/2020 138  03/13/2014 136   Potassium (mmol/L)  Date Value  04/22/2020 4.5  03/13/2014 3.7   Magnesium (mg/dL)  Date Value  75/17/0017 2.1   Phosphorus (mg/dL)  Date Value  49/44/9675 3.5   Calcium (mg/dL)  Date Value  91/63/8466 7.5 (L)   Calcium, Total (mg/dL)  Date Value  59/93/5701 8.3 (L)   Albumin (g/dL)  Date Value  77/93/9030 2.5 (L)  03/14/2014 2.3 (L)   Corrected Ca: 8.7 mg/dL  Assessment/Plan:  Electrolytes:   patient receiving LR at 127mL/hr  No further replacement warranted  Will obtain all electrolytes with a labs   Glucose:   patient on dexamethazone 10mg  IV Daily (day #3)   No SSI warranted  Will follow with daily labs and add as warranted.   Pharmacy will continue to monitor and adjust per consult.   04/22/2020 6:57 AM
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
## 2020-04-23 DIAGNOSIS — K921 Melena: Secondary | ICD-10-CM

## 2020-04-23 LAB — CBC WITH DIFFERENTIAL/PLATELET
Abs Immature Granulocytes: 0.02 10*3/uL (ref 0.00–0.07)
Basophils Absolute: 0 10*3/uL (ref 0.0–0.1)
Basophils Relative: 0 %
Eosinophils Absolute: 0 10*3/uL (ref 0.0–0.5)
Eosinophils Relative: 0 %
HCT: 34 % — ABNORMAL LOW (ref 36.0–46.0)
Hemoglobin: 10.1 g/dL — ABNORMAL LOW (ref 12.0–15.0)
Immature Granulocytes: 1 %
Lymphocytes Relative: 15 %
Lymphs Abs: 0.5 10*3/uL — ABNORMAL LOW (ref 0.7–4.0)
MCH: 24.8 pg — ABNORMAL LOW (ref 26.0–34.0)
MCHC: 29.7 g/dL — ABNORMAL LOW (ref 30.0–36.0)
MCV: 83.5 fL (ref 80.0–100.0)
Monocytes Absolute: 0.1 10*3/uL (ref 0.1–1.0)
Monocytes Relative: 3 %
Neutro Abs: 2.8 10*3/uL (ref 1.7–7.7)
Neutrophils Relative %: 81 %
Platelets: 68 10*3/uL — ABNORMAL LOW (ref 150–400)
RBC: 4.07 MIL/uL (ref 3.87–5.11)
RDW: 20.6 % — ABNORMAL HIGH (ref 11.5–15.5)
WBC: 3.4 10*3/uL — ABNORMAL LOW (ref 4.0–10.5)
nRBC: 0 % (ref 0.0–0.2)

## 2020-04-23 LAB — COMPREHENSIVE METABOLIC PANEL
ALT: 10 U/L (ref 0–44)
AST: 17 U/L (ref 15–41)
Albumin: 2.3 g/dL — ABNORMAL LOW (ref 3.5–5.0)
Alkaline Phosphatase: 49 U/L (ref 38–126)
Anion gap: 6 (ref 5–15)
BUN: 26 mg/dL — ABNORMAL HIGH (ref 8–23)
CO2: 23 mmol/L (ref 22–32)
Calcium: 7.5 mg/dL — ABNORMAL LOW (ref 8.9–10.3)
Chloride: 112 mmol/L — ABNORMAL HIGH (ref 98–111)
Creatinine, Ser: 1.01 mg/dL — ABNORMAL HIGH (ref 0.44–1.00)
GFR calc Af Amer: >60 mL/min (ref >60)
GFR calc non Af Amer: 60 mL/min — ABNORMAL LOW (ref >60)
Glucose, Bld: 99 mg/dL (ref 70–99)
Potassium: 4.1 mmol/L (ref 3.5–5.1)
Sodium: 141 mmol/L (ref 135–145)
Total Bilirubin: 0.7 mg/dL (ref 0.3–1.2)
Total Protein: 5.8 g/dL — ABNORMAL LOW (ref 6.5–8.1)

## 2020-04-23 LAB — GLUCOSE, CAPILLARY
Glucose-Capillary: 90 mg/dL (ref 70–99)
Glucose-Capillary: 95 mg/dL (ref 70–99)
Glucose-Capillary: 95 mg/dL (ref 70–99)
Glucose-Capillary: 106 mg/dL — ABNORMAL HIGH (ref 70–99)

## 2020-04-23 LAB — HEMOGLOBIN AND HEMATOCRIT, BLOOD
HCT: 30.3 % — ABNORMAL LOW (ref 36.0–46.0)
Hemoglobin: 9.3 g/dL — ABNORMAL LOW (ref 12.0–15.0)

## 2020-04-23 LAB — PHOSPHORUS: Phosphorus: 3.6 mg/dL (ref 2.5–4.6)

## 2020-04-23 LAB — FERRITIN: Ferritin: 13 ng/mL (ref 11–307)

## 2020-04-23 LAB — MAGNESIUM: Magnesium: 2 mg/dL (ref 1.7–2.4)

## 2020-04-23 LAB — FIBRIN DERIVATIVES D-DIMER (ARMC ONLY): Fibrin derivatives D-dimer (ARMC): >7500 ng/mL (FEU) — ABNORMAL HIGH (ref 0.00–499.00)

## 2020-04-23 LAB — C-REACTIVE PROTEIN: CRP: 0.7 mg/dL (ref <1.0)

## 2020-04-23 LAB — AMMONIA: Ammonia: 28 umol/L (ref 9–35)

## 2020-04-23 NOTE — Progress Notes (Signed)
RN Leotis Shames to notify MD about IV/PICC team recommendations from Saturday

## 2020-04-23 NOTE — Progress Notes (Signed)
CRITICAL CARE NOTE 64 y.o.femalewith a known history of COPD, protein calorie malnutrition, hepatitis C, hypertension, IV drug abuse +COVID pneumonia and severe anemia  -Patient's boyfriend/significant otherpassed away on Wednesday this week at [facility] (he was admitted there for COVID and died of complication, she is not coping well withthis.   -She did smoke crack on Wednesdayper records.  patient had cocaine use this morning. She did report black stools.Patient c/o severe headache and chest pain and requesting pain meds.  5/14 admitted for severe anemia, COVID 19 pneumonia and COCAINE ABUSE 5/15 severe encepphalopathy, high risk for intubation, on precedex 5/16 severe encephalopathy, high risk for intubation on precedex 5/16 +BLOOD CULTURES STREPTOCOCCUS SPECIES 5/17 - no acute events today   CC  follow up DT's COVID 19 penumonia   HPI Patient remains critically ill Prognosis is guarded Severe toxic metabolic encephalopathy    BP 16/10   Pulse (!) 59   Temp 98.3 F (36.8 C) (Axillary)   Resp 11   Ht 5\' 2"  (1.575 m)   Wt 63.5 kg   SpO2 99%   BMI 25.61 kg/m    I/O last 3 completed shifts: In: 4002.6 [I.V.:3802.6; IV Piggyback:200] Out: 950 [Urine:950] No intake/output data recorded.  SpO2: 99 % O2 Flow Rate (L/min): 3 L/min FiO2 (%): (!) 5 %  Estimated body mass index is 25.61 kg/m as calculated from the following:   Height as of this encounter: 5\' 2"  (1.575 m).   Weight as of this encounter: 63.5 kg.  SIGNIFICANT EVENTS   REVIEW OF SYSTEMS  PATIENT IS UNABLE TO PROVIDE COMPLETE REVIEW OF SYSTEMS DUE TO SEVERE CRITICAL ILLNESS        PHYSICAL EXAMINATION:  GENERAL:critically ill appearing,  HEAD: Normocephalic, atraumatic.  EYES: Pupils equal, round, reactive to light.  No scleral icterus.  MOUTH: Moist mucosal membrane. NECK: Supple.  PULMONARY: +rhonchi,bilaterally CARDIOVASCULAR: S1 and S2. Regular rate and rhythm. No murmurs,  rubs, or gallops.  GASTROINTESTINAL: Soft, nontender, -distended.  Positive bowel sounds.   MUSCULOSKELETAL: No swelling, clubbing, or edema.  NEUROLOGIC: obtunded, GCS<8 SKIN:intact,warm,dry  MEDICATIONS: I have reviewed all medications and confirmed regimen as documented   CULTURE RESULTS   Recent Results (from the past 240 hour(s))  MRSA PCR Screening     Status: Abnormal   Collection Time: 04/20/20 11:27 AM  Result Value Ref Range Status   MRSA by PCR POSITIVE (A) NEGATIVE Final    Comment:        The GeneXpert MRSA Assay (FDA approved for NASAL specimens only), is one component of a comprehensive MRSA colonization surveillance program. It is not intended to diagnose MRSA infection nor to guide or monitor treatment for MRSA infections. CRITICAL RESULT CALLED TO, READ BACK BY AND VERIFIED WITH: MIRANBA ROBLES @1921  04/20/2020 TTG Performed at [facility], [address], [place], [facility] [address]   Blood Culture (routine x 2)     Status: None (Preliminary result)   Collection Time: 04/20/20 11:54 AM   Specimen: BLOOD  Result Value Ref Range Status   Specimen Description   Final    BLOOD LT [facility] Performed at [facility], [address], [place], [facility] [address]    Special Requests   Final    BOTTLES DRAWN AEROBIC AND ANAEROBIC Blood Culture results may not be optimal due to an excessive volume of blood received in culture bottles Performed at [facility], [address] [place], [facility] [address]    Culture  Setup Time  Final    Organism ID to follow GRAM POSITIVE COCCI IN BOTH AEROBIC AND ANAEROBIC BOTTLES CRITICAL RESULT CALLED TO, READ BACK BY AND VERIFIED WITH: ALEX CHAPPELL ON 04/21/2020 AT 1231 TIK Performed at [facility], [address] [place], [place] [address]    Culture   Final    GRAM POSITIVE COCCI CULTURE REINCUBATED FOR BETTER GROWTH Performed at [facility], [address], [place],  [place] [address]    Report Status PENDING  Incomplete  Blood Culture (routine x 2)     Status: None (Preliminary result)   Collection Time: 04/20/20 11:54 AM   Specimen: BLOOD  Result Value Ref Range Status   Specimen Description BLOOD RT [facility]  Final   Special Requests   Final    BOTTLES DRAWN AEROBIC AND ANAEROBIC Blood Culture adequate volume   Culture   Final    NO GROWTH 3 DAYS Performed at [facility], [address] [place], [place] [address]    Report Status PENDING  Incomplete  Blood Culture ID Panel (Reflexed)     Status: Abnormal   Collection Time: 04/20/20 11:54 AM  Result Value Ref Range Status   Enterococcus species NOT DETECTED NOT DETECTED Final   Listeria monocytogenes NOT DETECTED NOT DETECTED Final   Staphylococcus species NOT DETECTED NOT DETECTED Final   Staphylococcus aureus (BCID) NOT DETECTED NOT DETECTED Final   Streptococcus species DETECTED (A) NOT DETECTED Final    Comment: Not Enterococcus species, Streptococcus agalactiae, Streptococcus pyogenes, or Streptococcus pneumoniae. CRITICAL RESULT CALLED TO, READ BACK BY AND VERIFIED WITH: ALEX CHAPPELL ON 04/21/2020 AT 1231 TIK    Streptococcus agalactiae NOT DETECTED NOT DETECTED Final   Streptococcus pneumoniae NOT DETECTED NOT DETECTED Final   Streptococcus pyogenes NOT DETECTED NOT DETECTED Final   Acinetobacter baumannii NOT DETECTED NOT DETECTED Final   Enterobacteriaceae species NOT DETECTED NOT DETECTED Final   Enterobacter cloacae complex NOT DETECTED NOT DETECTED Final   Escherichia coli NOT DETECTED NOT DETECTED Final   Klebsiella oxytoca NOT DETECTED NOT DETECTED Final   Klebsiella pneumoniae NOT DETECTED NOT DETECTED Final   Proteus species NOT DETECTED NOT DETECTED Final   Serratia marcescens NOT DETECTED NOT DETECTED Final   Haemophilus influenzae NOT DETECTED NOT DETECTED Final   Neisseria meningitidis NOT DETECTED NOT DETECTED Final   Pseudomonas aeruginosa NOT DETECTED NOT DETECTED  Final   Candida albicans NOT DETECTED NOT DETECTED Final   Candida glabrata NOT DETECTED NOT DETECTED Final   Candida krusei NOT DETECTED NOT DETECTED Final   Candida parapsilosis NOT DETECTED NOT DETECTED Final   Candida tropicalis NOT DETECTED NOT DETECTED Final    Comment: Performed at [facility], [address] [place], [place] [address]  SARS Coronavirus 2 by RT PCR (hospital order, performed in [facility] hospital lab) Nasopharyngeal Nasopharyngeal Swab     Status: Abnormal   Collection Time: 04/20/20 12:57 PM   Specimen: Nasopharyngeal Swab  Result Value Ref Range Status   SARS Coronavirus 2 POSITIVE (A) NEGATIVE Final    Comment: RESULT CALLED TO, READ BACK BY AND VERIFIED WITH:  LEXIE OLIVER AT 1416 04/20/20 (NOTE) SARS-CoV-2 target nucleic acids are DETECTED SARS-CoV-2 RNA is generally detectable in upper respiratory specimens  during the acute phase of infection.  Positive results are indicative  of the presence of the identified virus, but do not rule out bacterial infection or co-infection with other pathogens not detected by the test.  Clinical correlation with patient history and  other diagnostic information is necessary to determine patient infection status.  The expected result is negative. Fact Sheet for Patients:   StrictlyIdeas.no  Fact Sheet for Healthcare Providers:   BankingDealers.co.za   This test is not yet approved or cleared by the [place] FDA and  has been authorized for detection and/or diagnosis of SARS-CoV-2 by FDA under an Emergency Use Authorization (EUA).  This EUA will remain in effect (meaning this test can be use d) for the duration of  the COVID-19 declaration under Section 564(b)(1) of the Act, 21 U.S.C. section 360-bbb-3(b)(1), unless the authorization is terminated or revoked sooner. Performed at [facility], [address] [place], [place] [address]          CBC    Component Value Date/Time   WBC 5.2 04/22/2020 0321   RBC 3.44 (L) 04/22/2020 0321   HGB 9.3 (L) 04/23/2020 1448   HGB 13.7 03/13/2014 1603   HCT 30.3 (L) 04/23/2020 1448   HCT 41.7 03/13/2014 1603   PLT 103 (L) 04/22/2020 0321   PLT 100 (L) 03/13/2014 1603   MCV 78.5 (L) 04/22/2020 0321   MCV 102 (H) 03/13/2014 1603   MCH 24.7 (L) 04/22/2020 0321   MCHC 31.5 04/22/2020 0321   RDW 19.6 (H) 04/22/2020 0321   RDW 15.1 (H) 03/13/2014 1603   LYMPHSABS 0.6 (L) 04/22/2020 0321   MONOABS 0.3 04/22/2020 0321   EOSABS 0.0 04/22/2020 0321   BASOSABS 0.0 04/22/2020 0321   BMP Latest Ref Rng & Units 04/23/2020 04/22/2020 04/21/2020  Glucose 70 - 99 mg/dL 99 102(H) 134(H)  BUN 8 - 23 mg/dL 26(H) 22 16  Creatinine 0.44 - 1.00 mg/dL 1.01(H) 0.85 0.73  Sodium 135 - 145 mmol/L 141 138 135  Potassium 3.5 - 5.1 mmol/L 4.1 4.5 4.7  Chloride 98 - 111 mmol/L 112(H) 109 108  CO2 22 - 32 mmol/L 23 22 22   Calcium 8.9 - 10.3 mg/dL 7.5(L) 7.5(L) 7.4(L)     IMAGING    No results found.   Nutrition Status: Nutrition Problem: Increased nutrient needs Etiology: catabolic illness(Pneumonia secondary to COVID-19 virus infection) Signs/Symptoms: estimated needs Interventions: Tube feeding, Boost Breeze, MVI     Indwelling Urinary Catheter continued, requirement due to   Reason to continue Indwelling Urinary Catheter strict Intake/Output monitoring for hemodynamic instability     ASSESSMENT AND PLAN  Acute hypoxemic respiratory faliure -due to COVID19 pnemonia -remdesevir -vit c/z -dexamethasone -s/p ivermectin - supportive care -ICU telemetry monitoring   Polysubstance abuse  -cocaine and alcohol abuse actively -monitoro for signs/symptoms of wihtdrawal and seizure activity -Therapy with Thiamine and MVI -CIWA Protocol -Precedex as needed -High risk for intubation  -high risk for aspiration  Sepsis -present on admission  -due to strep pneumoniae bacteremia   GI GI  PROPHYLAXIS as indicated  NUTRITIONAL STATUS Nutrition Status: Nutrition Problem: Increased nutrient needs Etiology: catabolic illness(Pneumonia secondary to COVID-19 virus infection) Signs/Symptoms: estimated needs Interventions: Tube feeding, Boost Breeze, MVI   DIET-->NPO   ENDO - will use ICU hypoglycemic\Hyperglycemia protocol if indicated     ELECTROLYTES -follow labs as needed -replace as needed -pharmacy consultation and following   DVT/GI PRX ordered and assessed TRANSFUSIONS AS NEEDED MONITOR FSBS  CASE DISCUSSED IN MULTIDISCIPLINARY ROUNDS WITH ICU TEAM  Critical Care Time devoted to patient care services described in this note is 34 minutes.   Overall, patient is critically ill, prognosis is guarded.  Patient with Multiorgan failure and at high risk for cardiac arrest and death.  Ottie Glazier, M.D.  Pulmonary & [place]

## 2020-04-23 NOTE — Progress Notes (Signed)
Kylie Lame, MD [facility]   [address], [place], [place] [address] Phone: [phone] Fax : [phone]   Subjective: This patient is being followed for her anemia with melena with a history of hepatitis C, IV drug use and was admitted after smoking crack cocaine.  The patient's hemoglobin was 9.3 today and it was 9.1 yesterday.  On May 14 the patient was admitted with a hemoglobin of 2.3.  The patient is critically ill with metabolic encephalopathy.  Objective: Vital signs in last 24 hours: Vitals:   04/23/20 1400 04/23/20 1600 04/23/20 1703 04/23/20 1800  BP: 128/75 106/71 124/74 104/63  Pulse: 65 65 62 64  Resp: _0 Temp: 98.3 F (36.8 C) 98.2 F (36.8 C)  98.1 F (36.7 C)  TempSrc: Oral Oral  Oral  SpO2: 98% 98%  98%  Weight:      Height:       Weight change:   Intake/Output Summary (Last 24 hours) at 04/23/2020 1901 Last data filed at 04/23/2020 0500 Gross per 24 hour  Intake 4002.56 ml  Output 700 ml  Net 3302.56 ml     Exam: Not done due to patient's isolation with Covid   Lab Results: _1 @ Micro Results: Recent Results (from the past 240 hour(s))  MRSA PCR Screening     Status: Abnormal   Collection Time: 04/20/20 11:27 AM  Result Value Ref Range Status   MRSA by PCR POSITIVE (A) NEGATIVE Final    Comment:        The GeneXpert MRSA Assay (FDA approved for NASAL specimens only), is one component of a comprehensive MRSA colonization surveillance program. It is not intended to diagnose MRSA infection nor to guide or monitor treatment for MRSA infections. CRITICAL RESULT CALLED TO, READ BACK BY AND VERIFIED WITH: MIRANBA ROBLES _2  04/20/2020 TTG Performed at [facility], [address], [place], [place] [address]   Blood Culture (routine x 2)     Status: None (Preliminary result)   Collection Time: 04/20/20 11:54 AM   Specimen: BLOOD  Result Value Ref Range Status   Specimen Description   Final    BLOOD LT  [facility] Performed at [facility], [address], [place], [place] [address]    Special Requests   Final    BOTTLES DRAWN AEROBIC AND ANAEROBIC Blood Culture results may not be optimal due to an excessive volume of blood received in culture bottles Performed at [facility], [address] [place], [place] [address]    Culture  Setup Time   Final    Organism ID to follow [place] CRITICAL RESULT CALLED TO, READ BACK BY AND VERIFIED WITH: ALEX CHAPPELL ON 04/21/2020 AT 1231 TIK Performed at [facility], [address], [place], [place] [address]    Culture   Final    Lonell Grandchild POSITIVE COCCI CULTURE REINCUBATED FOR BETTER GROWTH Performed at [facility], [place] [address] [place], [place] [address]    Report Status PENDING  Incomplete  Blood Culture (routine x 2)     Status: None (Preliminary result)   Collection Time: 04/20/20 11:54 AM   Specimen: BLOOD  Result Value Ref Range Status   Specimen Description BLOOD RT [facility]  Final   Special Requests   Final    BOTTLES DRAWN AEROBIC AND ANAEROBIC Blood Culture adequate volume   Culture   Final    NO GROWTH 3 DAYS Performed at  [facility], [address] [place], [place] [address]    Report Status PENDING  Incomplete  Blood Culture ID Panel (Reflexed)     Status: Abnormal   Collection Time: 04/20/20 11:54 AM  Result Value Ref Range Status   Enterococcus species NOT DETECTED NOT DETECTED Final   Listeria monocytogenes NOT DETECTED NOT DETECTED Final   Staphylococcus species NOT DETECTED NOT DETECTED Final   Staphylococcus aureus (BCID) NOT DETECTED NOT DETECTED Final   Streptococcus species DETECTED (A) NOT DETECTED Final    Comment: Not Enterococcus species, Streptococcus agalactiae, Streptococcus pyogenes, or Streptococcus pneumoniae. CRITICAL RESULT CALLED TO, READ BACK BY AND VERIFIED WITH: ALEX CHAPPELL ON 04/21/2020 AT 1231 TIK     Streptococcus agalactiae NOT DETECTED NOT DETECTED Final   Streptococcus pneumoniae NOT DETECTED NOT DETECTED Final   Streptococcus pyogenes NOT DETECTED NOT DETECTED Final   Acinetobacter baumannii NOT DETECTED NOT DETECTED Final   Enterobacteriaceae species NOT DETECTED NOT DETECTED Final   Enterobacter cloacae complex NOT DETECTED NOT DETECTED Final   Escherichia coli NOT DETECTED NOT DETECTED Final   Klebsiella oxytoca NOT DETECTED NOT DETECTED Final   Klebsiella pneumoniae NOT DETECTED NOT DETECTED Final   Proteus species NOT DETECTED NOT DETECTED Final   Serratia marcescens NOT DETECTED NOT DETECTED Final   Haemophilus influenzae NOT DETECTED NOT DETECTED Final   Neisseria meningitidis NOT DETECTED NOT DETECTED Final   Pseudomonas aeruginosa NOT DETECTED NOT DETECTED Final   Candida albicans NOT DETECTED NOT DETECTED Final   Candida glabrata NOT DETECTED NOT DETECTED Final   Candida krusei NOT DETECTED NOT DETECTED Final   Candida parapsilosis NOT DETECTED NOT DETECTED Final   Candida tropicalis NOT DETECTED NOT DETECTED Final    Comment: Performed at [facility], [address] [place], [place] [address]  SARS Coronavirus 2 by RT PCR (hospital order, performed in [facility] hospital lab) Nasopharyngeal Nasopharyngeal Swab     Status: Abnormal   Collection Time: 04/20/20 12:57 PM   Specimen: Nasopharyngeal Swab  Result Value Ref Range Status   SARS Coronavirus 2 POSITIVE (A) NEGATIVE Final    Comment: RESULT CALLED TO, READ BACK BY AND VERIFIED WITH:  LEXIE OLIVER AT 4970 04/20/20 (NOTE) SARS-CoV-2 target nucleic acids are DETECTED SARS-CoV-2 RNA is generally detectable in upper respiratory specimens  during the acute phase of infection.  Positive results are indicative  of the presence of the identified virus, but do not rule out bacterial infection or co-infection with other pathogens not detected by the test.  Clinical correlation with patient history and  other  diagnostic information is necessary to determine patient infection status.  The expected result is negative. Fact Sheet for Patients:   StrictlyIdeas.no  Fact Sheet for Healthcare Providers:   BankingDealers.co.za   This test is not yet approved or cleared by the [place] FDA and  has been authorized for detection and/or diagnosis of SARS-CoV-2 by FDA under an Emergency Use Authorization (EUA).  This EUA will remain in effect (meaning this test can be use d) for the duration of  the COVID-19 declaration under Section 564(b)(1) of the Act, 21 U.S.C. section 360-bbb-3(b)(1), unless the authorization is terminated or revoked sooner. Performed at [facility], [address], [place], [place] [address]    Studies/Results: No results found. Medications: I have reviewed the patient's current medications. Scheduled Meds: . sodium chloride   Intravenous Once  . vitamin C  500 mg Oral Daily  . B-complex with vitamin C  1 tablet Oral Daily  . chlorhexidine gluconate (MEDLINE KIT)  15 mL Mouth Rinse BID  . Chlorhexidine Gluconate Cloth  6 each Topical Daily  . dexamethasone (DECADRON) injection  6 mg Intravenous Q24H  . diazepam  5 mg Oral QID  . feeding supplement  1 Container Oral TID BM  . feeding supplement (PRO-STAT SUGAR FREE 64)  30 mL Oral BID  . folic acid  1 mg Oral Daily  . insulin aspart  0-9 Units Subcutaneous Q4H  . insulin detemir  0.075 Units/kg Subcutaneous BID  . ivermectin  200 mcg/kg Oral Daily  . mouth rinse  15 mL Mouth Rinse 10 times per day  . melatonin  10 mg Oral QHS  . [START ON 04/24/2020] pantoprazole  40 mg Intravenous Q12H  . thiamine  100 mg Oral Daily  . zinc sulfate  220 mg Oral Daily   Continuous Infusions: . sodium chloride Stopped (04/22/20 0502)  . cefTRIAXone (ROCEPHIN)  IV 2 g (04/23/20 1720)  . dexmedetomidine (PRECEDEX) IV infusion 1.1 mcg/kg/hr (04/23/20 1720)  . lactated ringers  Stopped (04/22/20 1651)  . octreotide  (SANDOSTATIN)    IV infusion 50 mcg/hr (04/23/20 1157)  . remdesivir 100 mg in NS 100 mL Stopped (04/23/20 1140)   PRN Meds:.acetaminophen, albuterol, chlorpheniramine-HYDROcodone, guaiFENesin-dextromethorphan, LORazepam, LORazepam **OR** LORazepam, ondansetron **OR** ondansetron (ZOFRAN) IV, ziprasidone   Assessment: Active Problems:   Pneumonia due to COVID-19 virus   Anemia   COVID-19    Plan: This patient is critically ill and her hemoglobin is stable.  The patient's hemoglobin has been trending upwards without any further signs of bleeding.  I do not plan any intervention in the near future with this patient's recent cocaine use, Covid infection and stable labs.   LOS: 3 days   Kylie Monroe 04/23/2020, 7:01 PM Pager [phone] 7am-5pm  Check AMION for 5pm -7am coverage and on weekends

## 2020-04-23 NOTE — Progress Notes (Signed)
Pharmacy Electrolyte Monitoring Consult:  Pharmacy consulted to assist in monitoring and replacing electrolytes in this 64 y.o. female admitted on 04/20/2020 with GI Bleed, COVID 19 pneumonia, and cocaine abuse.   Labs:  Sodium (mmol/L)  Date Value  04/23/2020 141  03/13/2014 136   Potassium (mmol/L)  Date Value  04/23/2020 4.1  03/13/2014 3.7   Magnesium (mg/dL)  Date Value  91/50/5697 2.0   Phosphorus (mg/dL)  Date Value  94/80/1655 3.6   Calcium (mg/dL)  Date Value  37/48/2707 7.5 (L)   Calcium, Total (mg/dL)  Date Value  86/75/4492 8.3 (L)   Albumin (g/dL)  Date Value  01/00/7121 2.3 (L)  03/14/2014 2.3 (L)   Corrected Ca: 8.9 mg/dL  Assessment/Plan:  Electrolytes:   patient receiving LR at 139mL/hr  No further replacement warranted  Will obtain all electrolytes with a labs   Glucose:   patient on dexamethazone 10mg  IV Daily (day 4)   No SSI warranted  Will follow with daily labs and add as warranted.   Pharmacy will continue to monitor and adjust per consult.   Gaius Ishaq L 04/23/2020 12:50 PM

## 2020-04-24 ENCOUNTER — Inpatient Hospital Stay: Payer: Medicaid Other

## 2020-04-24 LAB — CBC WITH DIFFERENTIAL/PLATELET
Abs Immature Granulocytes: 0.02 10*3/uL (ref 0.00–0.07)
Basophils Absolute: 0 10*3/uL (ref 0.0–0.1)
Basophils Relative: 0 %
Eosinophils Absolute: 0 10*3/uL (ref 0.0–0.5)
Eosinophils Relative: 1 %
HCT: 35.4 % — ABNORMAL LOW (ref 36.0–46.0)
Hemoglobin: 10.7 g/dL — ABNORMAL LOW (ref 12.0–15.0)
Immature Granulocytes: 1 %
Lymphocytes Relative: 14 %
Lymphs Abs: 0.6 10*3/uL — ABNORMAL LOW (ref 0.7–4.0)
MCH: 24.9 pg — ABNORMAL LOW (ref 26.0–34.0)
MCHC: 30.2 g/dL (ref 30.0–36.0)
MCV: 82.3 fL (ref 80.0–100.0)
Monocytes Absolute: 0.2 10*3/uL (ref 0.1–1.0)
Monocytes Relative: 5 %
Neutro Abs: 3.5 10*3/uL (ref 1.7–7.7)
Neutrophils Relative %: 79 %
Platelets: 74 10*3/uL — ABNORMAL LOW (ref 150–400)
RBC: 4.3 MIL/uL (ref 3.87–5.11)
RDW: 20.9 % — ABNORMAL HIGH (ref 11.5–15.5)
WBC: 4.4 10*3/uL (ref 4.0–10.5)
nRBC: 0 % (ref 0.0–0.2)

## 2020-04-24 LAB — CULTURE, BLOOD (ROUTINE X 2)

## 2020-04-24 LAB — COMPREHENSIVE METABOLIC PANEL
ALT: 9 U/L (ref 0–44)
AST: 19 U/L (ref 15–41)
Albumin: 2.3 g/dL — ABNORMAL LOW (ref 3.5–5.0)
Alkaline Phosphatase: 51 U/L (ref 38–126)
Anion gap: 6 (ref 5–15)
BUN: 23 mg/dL (ref 8–23)
CO2: 26 mmol/L (ref 22–32)
Calcium: 7.3 mg/dL — ABNORMAL LOW (ref 8.9–10.3)
Chloride: 108 mmol/L (ref 98–111)
Creatinine, Ser: 0.95 mg/dL (ref 0.44–1.00)
GFR calc Af Amer: >60 mL/min (ref >60)
GFR calc non Af Amer: >60 mL/min (ref >60)
Glucose, Bld: 90 mg/dL (ref 70–99)
Potassium: 4.1 mmol/L (ref 3.5–5.1)
Sodium: 140 mmol/L (ref 135–145)
Total Bilirubin: 0.6 mg/dL (ref 0.3–1.2)
Total Protein: 5.8 g/dL — ABNORMAL LOW (ref 6.5–8.1)

## 2020-04-24 LAB — C-REACTIVE PROTEIN: CRP: 0.6 mg/dL (ref <1.0)

## 2020-04-24 LAB — FIBRIN DERIVATIVES D-DIMER (ARMC ONLY): Fibrin derivatives D-dimer (ARMC): >7500 ng/mL (FEU) — ABNORMAL HIGH (ref 0.00–499.00)

## 2020-04-24 LAB — HEMOGLOBIN AND HEMATOCRIT, BLOOD
HCT: 31.4 % — ABNORMAL LOW (ref 36.0–46.0)
HCT: 33.8 % — ABNORMAL LOW (ref 36.0–46.0)
HCT: 35.7 % — ABNORMAL LOW (ref 36.0–46.0)
HCT: 33.4 % — ABNORMAL LOW (ref 36.0–46.0)
HCT: 33.6 % — ABNORMAL LOW (ref 36.0–46.0)
Hemoglobin: 9.5 g/dL — ABNORMAL LOW (ref 12.0–15.0)
Hemoglobin: 10.1 g/dL — ABNORMAL LOW (ref 12.0–15.0)
Hemoglobin: 10.3 g/dL — ABNORMAL LOW (ref 12.0–15.0)
Hemoglobin: 9.9 g/dL — ABNORMAL LOW (ref 12.0–15.0)
Hemoglobin: 10 g/dL — ABNORMAL LOW (ref 12.0–15.0)

## 2020-04-24 LAB — GLUCOSE, CAPILLARY
Glucose-Capillary: 102 mg/dL — ABNORMAL HIGH (ref 70–99)
Glucose-Capillary: 109 mg/dL — ABNORMAL HIGH (ref 70–99)
Glucose-Capillary: 41 mg/dL — CL (ref 70–99)
Glucose-Capillary: 63 mg/dL — ABNORMAL LOW (ref 70–99)
Glucose-Capillary: 71 mg/dL (ref 70–99)
Glucose-Capillary: 76 mg/dL (ref 70–99)
Glucose-Capillary: 82 mg/dL (ref 70–99)

## 2020-04-24 LAB — COPPER, SERUM: Copper: 132 ug/dL (ref 80–158)

## 2020-04-24 LAB — FERRITIN: Ferritin: 14 ng/mL (ref 11–307)

## 2020-04-24 LAB — MAGNESIUM: Magnesium: 1.9 mg/dL (ref 1.7–2.4)

## 2020-04-24 LAB — PHOSPHORUS: Phosphorus: 3.9 mg/dL (ref 2.5–4.6)

## 2020-04-24 MED ORDER — DEXMEDETOMIDINE HCL IN NACL 400 MCG/100ML IV SOLN
0.4000 ug/kg/h | INTRAVENOUS | Status: DC
  Administered 2020-04-24 (×2): 1.2 ug/kg/h via INTRAVENOUS
  Administered 2020-04-25: 1 ug/kg/h via INTRAVENOUS
  Filled 2020-04-24 (×2): qty 100

## 2020-04-24 MED ORDER — DIPHENHYDRAMINE HCL 50 MG/ML IJ SOLN: 50.0000 mg | Freq: Once | INTRAMUSCULAR | Status: AC

## 2020-04-24 MED ORDER — CLONIDINE HCL 0.1 MG PO TABS
0.1000 mg | ORAL_TABLET | Freq: Two times a day (BID) | ORAL | Status: DC
  Administered 2020-04-24 – 2020-04-28 (×8): 0.1 mg via ORAL
  Filled 2020-04-24 (×11): qty 1

## 2020-04-24 MED ORDER — MAGNESIUM SULFATE IN D5W 1-5 GM/100ML-% IV SOLN
1.0000 g | Freq: Once | INTRAVENOUS | Status: AC
  Administered 2020-04-24: 1 g via INTRAVENOUS
  Filled 2020-04-24: qty 100

## 2020-04-24 MED ORDER — POLYETHYLENE GLYCOL 3350 17 G PO PACK
17.0000 g | PACK | Freq: Every day | ORAL | Status: DC
  Administered 2020-04-24: 17 g via ORAL
  Filled 2020-04-24: qty 1

## 2020-04-24 MED ORDER — DIPHENHYDRAMINE HCL 50 MG/ML IJ SOLN
INTRAMUSCULAR | Status: AC
  Administered 2020-04-24: 50 mg via INTRAVENOUS
  Filled 2020-04-24: qty 1

## 2020-04-24 MED ORDER — DEXTROSE 50 % IV SOLN
INTRAVENOUS | Status: AC
  Administered 2020-04-24: 50 mL
  Filled 2020-04-24: qty 50

## 2020-04-24 MED ORDER — DEXMEDETOMIDINE HCL IN NACL 400 MCG/100ML IV SOLN
0.4000 ug/kg/h | INTRAVENOUS | Status: DC
  Filled 2020-04-24: qty 100

## 2020-04-24 NOTE — Progress Notes (Signed)
Inpatient Diabetes Program Recommendations  AACE/ADA: New Consensus Statement on Inpatient Glycemic Control  Target Ranges:  Prepandial:   less than 140 mg/dL      Peak postprandial:   less than 180 mg/dL (1-2 hours)      Critically ill patients:  140 - 180 mg/dL   Results for ELLIONA, DODDRIDGE (MRN [id number]) as of 04/24/2020 09:15  Ref. Range 04/24/2020 00:12 04/24/2020 03:51 04/24/2020 07:27 04/24/2020 08:15  Glucose-Capillary Latest Ref Range: 70 - 99 mg/dL 973 (H) 82 41 (LL) 532 (H)  Results for LILU, MCGLOWN (MRN [id number]) as of 04/24/2020 09:15  Ref. Range 04/23/2020 04:32 04/23/2020 07:33 04/23/2020 11:47 04/23/2020 21:46  Glucose-Capillary Latest Ref Range: 70 - 99 mg/dL 90 95 95 196 (H)  Levemir 5 units   Review of Glycemic Control  Diabetes history: No Outpatient Diabetes medications: NA Current orders for Inpatient glycemic control: Levemir 5 units BID, Novolog 0-9 units Q4H; Decadron 6 mg Q24H  Inpatient Diabetes Program Recommendations:   Insulin - Basal: Please consider discontinuing Levemir for now and use Novolog correction as ordered if needed for hyperglycemia.  NOTE: In reviewing chart, noted Levemir was only given once on 04/23/20 and fasting glucose 41 mg/dl today. Recommend discontinuing Levemir and continuing Novolog correction as ordered.  Thanks, Orlando Penner, RN, MSN, CDE Diabetes Coordinator Inpatient Diabetes Program [phone] (Team Pager from 8am to 5pm)

## 2020-04-24 NOTE — Progress Notes (Signed)
Pharmacy Electrolyte Monitoring Consult:  Pharmacy consulted to assist in monitoring and replacing electrolytes in this 64 y.o. female admitted on 04/20/2020 with GI Bleed, COVID 19 pneumonia, and cocaine abuse.   Labs:  Sodium (mmol/L)  Date Value  04/24/2020 140  03/13/2014 136   Potassium (mmol/L)  Date Value  04/24/2020 4.1  03/13/2014 3.7   Magnesium (mg/dL)  Date Value  55/37/4827 1.9   Phosphorus (mg/dL)  Date Value  07/86/7544 3.9   Calcium (mg/dL)  Date Value  92/12/69 7.3 (L)   Calcium, Total (mg/dL)  Date Value  21/97/5883 8.3 (L)   Albumin (g/dL)  Date Value  25/49/8264 2.3 (L)  03/14/2014 2.3 (L)    Assessment/Plan:  Electrolytes:  Goal electrolytes WNL.  Currently receiving LR.  Will give magnesium sulfate 1 g IV x 1 dose.  Will obtain labs in the AM.   Glucose:  Glucose trending low while on 5 days of decadron.  Per diabetes coordinator recommendations, will stop levemir.  Patient remains on SSI for now.  Will continue to follow.  Constipation: Last BM 5/14.  Nothing currently ordered.  Will add miralax.  Pharmacy will continue to monitor and adjust per consult.   Cherly Hensen, PharmD 04/24/2020 3:09 PM

## 2020-04-24 NOTE — Progress Notes (Signed)
The patient's hemoglobin is back at baseline without any further sign of any GI bleeding.  The patient appears critically ill and is in no condition to undergo any endoscopic procedures at this time.  The patient is also Covid positive.  The patient should follow-up as an outpatient for a GI work-up.  I will sign off.  Please call if any further GI concerns or questions.  We would like to thank you for the opportunity to participate in the care of Kylie Monroe.

## 2020-04-24 NOTE — Progress Notes (Signed)
CRITICAL CARE NOTE 64 y.o.femalewith a known history of COPD, protein calorie malnutrition, hepatitis C, hypertension, IV drug abuse +COVID pneumonia and severe anemia  -Patient's boyfriend/significant otherpassed away on Wednesday this week at [facility] (he was admitted there for COVID and died of complication, she is not coping well withthis.   -She did smoke crack on Wednesdayper records.  patient had cocaine use this morning. She did report black stools.Patient c/o severe headache and chest pain and requesting pain meds.  5/14 admitted for severe anemia, COVID 19 pneumonia and COCAINE ABUSE 5/15 severe encepphalopathy, high risk for intubation, on precedex 5/16 severe encephalopathy, high risk for intubation on precedex 5/16 +BLOOD CULTURES STREPTOCOCCUS SPECIES 5/17 - no acute events today  04/24/20 - patient is now on 2L [place] , she does have episodes of yelling, she shares that she likes to speak loudly but seems tangential in speech. She is hemodynamically stable  CC  follow up DT's COVID 19 penumonia   HPI Patient remains critically ill Prognosis is guarded Severe toxic metabolic encephalopathy    BP (!) 91/52   Pulse (!) 46   Temp (!) 97.5 F (36.4 C) (Axillary)   Resp (!) 7   Ht 5\' 2"  (1.575 m)   Wt 73.6 kg   SpO2 99%   BMI 29.68 kg/m    I/O last 3 completed shifts: In: 5249.1 [I.V.:4849.1; IV Piggyback:400] Out: 2450 [Urine:2450] Total I/O In: 468.6 [I.V.:455.2; IV Piggyback:13.4] Out: 700 [Urine:700]  SpO2: 99 % O2 Flow Rate (L/min): 3 L/min FiO2 (%): (!) 5 %  Estimated body mass index is 29.68 kg/m as calculated from the following:   Height as of this encounter: 5\' 2"  (1.575 m).   Weight as of this encounter: 73.6 kg.  SIGNIFICANT EVENTS   REVIEW OF SYSTEMS  PATIENT IS UNABLE TO PROVIDE COMPLETE REVIEW OF SYSTEMS DUE TO SEVERE CRITICAL ILLNESS        PHYSICAL EXAMINATION:  GENERAL:critically ill appearing,  HEAD:  Normocephalic, atraumatic.  EYES: Pupils equal, round, reactive to light.  No scleral icterus.  MOUTH: Moist mucosal membrane. NECK: Supple.  PULMONARY: +rhonchi,bilaterally CARDIOVASCULAR: S1 and S2. Regular rate and rhythm. No murmurs, rubs, or gallops.  GASTROINTESTINAL: Soft, nontender, -distended.  Positive bowel sounds.   MUSCULOSKELETAL: No swelling, clubbing, or edema.  NEUROLOGIC: obtunded, GCS<8 SKIN:intact,warm,dry  MEDICATIONS: I have reviewed all medications and confirmed regimen as documented   CULTURE RESULTS   Recent Results (from the past 240 hour(s))  MRSA PCR Screening     Status: Abnormal   Collection Time: 04/20/20 11:27 AM  Result Value Ref Range Status   MRSA by PCR POSITIVE (A) NEGATIVE Final    Comment:        The GeneXpert MRSA Assay (FDA approved for NASAL specimens only), is one component of a comprehensive MRSA colonization surveillance program. It is not intended to diagnose MRSA infection nor to guide or monitor treatment for MRSA infections. CRITICAL RESULT CALLED TO, READ BACK BY AND VERIFIED WITH: MIRANBA ROBLES @1921  04/20/2020 TTG Performed at [facility], [address] [place], [facility] [address]   Blood Culture (routine x 2)     Status: Abnormal   Collection Time: 04/20/20 11:54 AM   Specimen: BLOOD  Result Value Ref Range Status   Specimen Description   Final    BLOOD LT [facility] Performed at [facility], [address], [place], [facility] [address]    Special Requests   Final    BOTTLES DRAWN AEROBIC AND ANAEROBIC  Blood Culture results may not be optimal due to an excessive volume of blood received in culture bottles Performed at [facility], [address] [place], [place] [address]    Culture  Setup Time   Final    Organism ID to follow New Site AND ANAEROBIC BOTTLES CRITICAL RESULT CALLED TO, READ BACK BY AND VERIFIED WITH: ALEX CHAPPELL ON 04/21/2020 AT 1231  TIK Performed at [facility], [address] [place], [place] [address]    Culture (A)  Final    VIRIDANS STREPTOCOCCUS STAPHYLOCOCCUS SPECIES (COAGULASE NEGATIVE) THE SIGNIFICANCE OF ISOLATING THIS ORGANISM FROM A SINGLE SET OF BLOOD CULTURES WHEN MULTIPLE SETS ARE DRAWN IS UNCERTAIN. PLEASE NOTIFY THE MICROBIOLOGY DEPARTMENT WITHIN ONE WEEK IF SPECIATION AND SENSITIVITIES ARE REQUIRED. Performed at [facility], [place] [address], [place], [place] [address]    Report Status 04/24/2020 FINAL  Final  Blood Culture (routine x 2)     Status: None (Preliminary result)   Collection Time: 04/20/20 11:54 AM   Specimen: BLOOD  Result Value Ref Range Status   Specimen Description BLOOD RT [facility]  Final   Special Requests   Final    BOTTLES DRAWN AEROBIC AND ANAEROBIC Blood Culture adequate volume   Culture   Final    NO GROWTH 4 DAYS Performed at [facility], [address] [place], [place] [address]    Report Status PENDING  Incomplete  Blood Culture ID Panel (Reflexed)     Status: Abnormal   Collection Time: 04/20/20 11:54 AM  Result Value Ref Range Status   Enterococcus species NOT DETECTED NOT DETECTED Final   Listeria monocytogenes NOT DETECTED NOT DETECTED Final   Staphylococcus species NOT DETECTED NOT DETECTED Final   Staphylococcus aureus (BCID) NOT DETECTED NOT DETECTED Final   Streptococcus species DETECTED (A) NOT DETECTED Final    Comment: Not Enterococcus species, Streptococcus agalactiae, Streptococcus pyogenes, or Streptococcus pneumoniae. CRITICAL RESULT CALLED TO, READ BACK BY AND VERIFIED WITH: ALEX CHAPPELL ON 04/21/2020 AT 1231 TIK    Streptococcus agalactiae NOT DETECTED NOT DETECTED Final   Streptococcus pneumoniae NOT DETECTED NOT DETECTED Final   Streptococcus pyogenes NOT DETECTED NOT DETECTED Final   Acinetobacter baumannii NOT DETECTED NOT DETECTED Final   Enterobacteriaceae species NOT DETECTED NOT DETECTED Final   Enterobacter  cloacae complex NOT DETECTED NOT DETECTED Final   Escherichia coli NOT DETECTED NOT DETECTED Final   Klebsiella oxytoca NOT DETECTED NOT DETECTED Final   Klebsiella pneumoniae NOT DETECTED NOT DETECTED Final   Proteus species NOT DETECTED NOT DETECTED Final   Serratia marcescens NOT DETECTED NOT DETECTED Final   Haemophilus influenzae NOT DETECTED NOT DETECTED Final   Neisseria meningitidis NOT DETECTED NOT DETECTED Final   Pseudomonas aeruginosa NOT DETECTED NOT DETECTED Final   Candida albicans NOT DETECTED NOT DETECTED Final   Candida glabrata NOT DETECTED NOT DETECTED Final   Candida krusei NOT DETECTED NOT DETECTED Final   Candida parapsilosis NOT DETECTED NOT DETECTED Final   Candida tropicalis NOT DETECTED NOT DETECTED Final    Comment: Performed at [facility], [address] [place], [place] [address]  SARS Coronavirus 2 by RT PCR (hospital order, performed in [place] hospital lab) Nasopharyngeal Nasopharyngeal Swab     Status: Abnormal   Collection Time: 04/20/20 12:57 PM   Specimen: Nasopharyngeal Swab  Result Value Ref Range Status   SARS Coronavirus 2 POSITIVE (A) NEGATIVE Final    Comment: RESULT CALLED TO,  READ BACK BY AND VERIFIED WITH:  LEXIE OLIVER AT 1416 04/20/20 (NOTE) SARS-CoV-2 target nucleic acids are DETECTED SARS-CoV-2 RNA is generally detectable in upper respiratory specimens  during the acute phase of infection.  Positive results are indicative  of the presence of the identified virus, but do not rule out bacterial infection or co-infection with other pathogens not detected by the test.  Clinical correlation with patient history and  other diagnostic information is necessary to determine patient infection status.  The expected result is negative. Fact Sheet for Patients:   BoilerBrush.com.cy  Fact Sheet for Healthcare Providers:   https://pope.com/   This test is not yet approved or cleared by  the [place] FDA and  has been authorized for detection and/or diagnosis of SARS-CoV-2 by FDA under an Emergency Use Authorization (EUA).  This EUA will remain in effect (meaning this test can be use d) for the duration of  the COVID-19 declaration under Section 564(b)(1) of the Act, 21 U.S.C. section 360-bbb-3(b)(1), unless the authorization is terminated or revoked sooner. Performed at [facility], [address] [place], [place] [address]         CBC    Component Value Date/Time   WBC 4.4 04/24/2020 0358   RBC 4.30 04/24/2020 0358   HGB 10.3 (L) 04/24/2020 1512   HGB 13.7 03/13/2014 1603   HCT 35.7 (L) 04/24/2020 1512   HCT 41.7 03/13/2014 1603   PLT 74 (L) 04/24/2020 0358   PLT 100 (L) 03/13/2014 1603   MCV 82.3 04/24/2020 0358   MCV 102 (H) 03/13/2014 1603   MCH 24.9 (L) 04/24/2020 0358   MCHC 30.2 04/24/2020 0358   RDW 20.9 (H) 04/24/2020 0358   RDW 15.1 (H) 03/13/2014 1603   LYMPHSABS 0.6 (L) 04/24/2020 0358   MONOABS 0.2 04/24/2020 0358   EOSABS 0.0 04/24/2020 0358   BASOSABS 0.0 04/24/2020 0358   BMP Latest Ref Rng & Units 04/24/2020 04/23/2020 04/22/2020  Glucose 70 - 99 mg/dL 90 99 604(V)  BUN 8 - 23 mg/dL 23 40(J) 22  Creatinine 0.44 - 1.00 mg/dL 8.11 9.14(N) 8.29  Sodium 135 - 145 mmol/L 140 141 138  Potassium 3.5 - 5.1 mmol/L 4.1 4.1 4.5  Chloride 98 - 111 mmol/L 108 112(H) 109  CO2 22 - 32 mmol/L 26 23 22   Calcium 8.9 - 10.3 mg/dL 7.3(L) 7.5(L) 7.5(L)     IMAGING    DG Chest Port 1 View  Result Date: 04/24/2020 CLINICAL DATA:  COVID positive, abnormal chest x-ray EXAM: PORTABLE CHEST 1 VIEW COMPARISON:  04/20/2020 FINDINGS: Persistent bilateral opacities with worsening lung aeration particularly at the right lung base. There is masslike right perihilar consolidation. Possible small pleural effusions. No pneumothorax. Stable cardiomediastinal contours. IMPRESSION: Persistent bilateral pulmonary opacities with worsening aeration. Possible  small pleural effusions. Electronically Signed   By: 04/22/2020 M.D.   On: 04/24/2020 08:29     Nutrition Status: Nutrition Problem: Increased nutrient needs Etiology: catabolic illness(Pneumonia secondary to COVID-19 virus infection) Signs/Symptoms: estimated needs Interventions: Tube feeding, Boost Breeze, MVI     Indwelling Urinary Catheter continued, requirement due to   Reason to continue Indwelling Urinary Catheter strict Intake/Output monitoring for hemodynamic instability     ASSESSMENT AND PLAN  Acute hypoxemic respiratory faliure -due to COVID19 pnemonia -remdesevir -vit c/z -dexamethasone -s/p ivermectin - supportive care -ICU telemetry monitoring   Polysubstance abuse  -cocaine and alcohol abuse actively -monitoro for signs/symptoms of wihtdrawal and seizure activity -Therapy with Thiamine and MVI -  CIWA Protocol -Precedex as needed -High risk for intubation  -high risk for aspiration  Sepsis -present on admission  -due to strep pneumoniae bacteremia   GI GI PROPHYLAXIS as indicated  NUTRITIONAL STATUS Nutrition Status: Nutrition Problem: Increased nutrient needs Etiology: catabolic illness(Pneumonia secondary to COVID-19 virus infection) Signs/Symptoms: estimated needs Interventions: Tube feeding, Boost Breeze, MVI   DIET-->NPO   ENDO - will use ICU hypoglycemic\Hyperglycemia protocol if indicated     ELECTROLYTES -follow labs as needed -replace as needed -pharmacy consultation and following   DVT/GI PRX ordered and assessed TRANSFUSIONS AS NEEDED MONITOR FSBS  CASE DISCUSSED IN MULTIDISCIPLINARY ROUNDS WITH ICU TEAM  Critical Care Time devoted to patient care services described in this note is 34 minutes.   Overall, patient is critically ill, prognosis is guarded.  Patient with Multiorgan failure and at high risk for cardiac arrest and death.     Vida Rigger, M.D.  Pulmonary & Critical Care Medicine  [facility]  [facility]

## 2020-04-25 LAB — COMPREHENSIVE METABOLIC PANEL
ALT: 9 U/L (ref 0–44)
AST: 19 U/L (ref 15–41)
Albumin: 2.3 g/dL — ABNORMAL LOW (ref 3.5–5.0)
Alkaline Phosphatase: 51 U/L (ref 38–126)
Anion gap: 3 — ABNORMAL LOW (ref 5–15)
BUN: 17 mg/dL (ref 8–23)
CO2: 29 mmol/L (ref 22–32)
Calcium: 7.3 mg/dL — ABNORMAL LOW (ref 8.9–10.3)
Chloride: 106 mmol/L (ref 98–111)
Creatinine, Ser: 0.72 mg/dL (ref 0.44–1.00)
GFR calc Af Amer: >60 mL/min (ref >60)
GFR calc non Af Amer: >60 mL/min (ref >60)
Glucose, Bld: 95 mg/dL (ref 70–99)
Potassium: 3.7 mmol/L (ref 3.5–5.1)
Sodium: 138 mmol/L (ref 135–145)
Total Bilirubin: 0.6 mg/dL (ref 0.3–1.2)
Total Protein: 5.4 g/dL — ABNORMAL LOW (ref 6.5–8.1)

## 2020-04-25 LAB — HEMOGLOBIN AND HEMATOCRIT, BLOOD
HCT: 34.2 % — ABNORMAL LOW (ref 36.0–46.0)
HCT: 34.6 % — ABNORMAL LOW (ref 36.0–46.0)
HCT: 35.3 % — ABNORMAL LOW (ref 36.0–46.0)
HCT: 36.5 % (ref 36.0–46.0)
Hemoglobin: 10.1 g/dL — ABNORMAL LOW (ref 12.0–15.0)
Hemoglobin: 10.2 g/dL — ABNORMAL LOW (ref 12.0–15.0)
Hemoglobin: 10.9 g/dL — ABNORMAL LOW (ref 12.0–15.0)
Hemoglobin: 10.7 g/dL — ABNORMAL LOW (ref 12.0–15.0)

## 2020-04-25 LAB — CBC WITH DIFFERENTIAL/PLATELET
Abs Immature Granulocytes: 0.02 10*3/uL (ref 0.00–0.07)
Basophils Absolute: 0 10*3/uL (ref 0.0–0.1)
Basophils Relative: 0 %
Eosinophils Absolute: 0.1 10*3/uL (ref 0.0–0.5)
Eosinophils Relative: 2 %
HCT: 33.4 % — ABNORMAL LOW (ref 36.0–46.0)
Hemoglobin: 9.9 g/dL — ABNORMAL LOW (ref 12.0–15.0)
Immature Granulocytes: 1 %
Lymphocytes Relative: 15 %
Lymphs Abs: 0.6 10*3/uL — ABNORMAL LOW (ref 0.7–4.0)
MCH: 24.8 pg — ABNORMAL LOW (ref 26.0–34.0)
MCHC: 29.6 g/dL — ABNORMAL LOW (ref 30.0–36.0)
MCV: 83.5 fL (ref 80.0–100.0)
Monocytes Absolute: 0.2 10*3/uL (ref 0.1–1.0)
Monocytes Relative: 6 %
Neutro Abs: 2.9 10*3/uL (ref 1.7–7.7)
Neutrophils Relative %: 76 %
Platelets: 65 10*3/uL — ABNORMAL LOW (ref 150–400)
RBC: 4 MIL/uL (ref 3.87–5.11)
RDW: 20.9 % — ABNORMAL HIGH (ref 11.5–15.5)
WBC: 3.8 10*3/uL — ABNORMAL LOW (ref 4.0–10.5)
nRBC: 0 % (ref 0.0–0.2)

## 2020-04-25 LAB — C-REACTIVE PROTEIN: CRP: 0.7 mg/dL (ref <1.0)

## 2020-04-25 LAB — CULTURE, BLOOD (ROUTINE X 2)
Culture: NO GROWTH
Special Requests: ADEQUATE

## 2020-04-25 LAB — GLUCOSE, CAPILLARY
Glucose-Capillary: 101 mg/dL — ABNORMAL HIGH (ref 70–99)
Glucose-Capillary: 104 mg/dL — ABNORMAL HIGH (ref 70–99)
Glucose-Capillary: 136 mg/dL — ABNORMAL HIGH (ref 70–99)
Glucose-Capillary: 51 mg/dL — ABNORMAL LOW (ref 70–99)
Glucose-Capillary: 69 mg/dL — ABNORMAL LOW (ref 70–99)
Glucose-Capillary: 83 mg/dL (ref 70–99)
Glucose-Capillary: 89 mg/dL (ref 70–99)
Glucose-Capillary: 92 mg/dL (ref 70–99)
Glucose-Capillary: 94 mg/dL (ref 70–99)
Glucose-Capillary: 98 mg/dL (ref 70–99)

## 2020-04-25 LAB — FIBRIN DERIVATIVES D-DIMER (ARMC ONLY): Fibrin derivatives D-dimer (ARMC): >7500 ng/mL (FEU) — ABNORMAL HIGH (ref 0.00–499.00)

## 2020-04-25 LAB — MAGNESIUM: Magnesium: 1.8 mg/dL (ref 1.7–2.4)

## 2020-04-25 LAB — FERRITIN: Ferritin: 11 ng/mL (ref 11–307)

## 2020-04-25 LAB — PHOSPHORUS: Phosphorus: 3.1 mg/dL (ref 2.5–4.6)

## 2020-04-25 MED ORDER — MAGNESIUM SULFATE 2 GM/50ML IV SOLN
2.0000 g | Freq: Once | INTRAVENOUS | Status: AC
  Administered 2020-04-25: 2 g via INTRAVENOUS
  Filled 2020-04-25: qty 50

## 2020-04-25 MED ORDER — FUROSEMIDE 10 MG/ML IJ SOLN
40.0000 mg | Freq: Every day | INTRAMUSCULAR | Status: DC
  Administered 2020-04-25 – 2020-04-29 (×5): 40 mg via INTRAVENOUS
  Filled 2020-04-25 (×5): qty 4

## 2020-04-25 MED ORDER — PHENOBARBITAL 32.4 MG PO TABS
16.2000 mg | ORAL_TABLET | Freq: Two times a day (BID) | ORAL | Status: DC
  Administered 2020-04-25 – 2020-04-28 (×6): 16.2 mg via ORAL
  Filled 2020-04-25 (×10): qty 1

## 2020-04-25 MED ORDER — SENNOSIDES-DOCUSATE SODIUM 8.6-50 MG PO TABS
2.0000 | ORAL_TABLET | Freq: Two times a day (BID) | ORAL | Status: DC
  Filled 2020-04-25: qty 2

## 2020-04-25 MED ORDER — ALBUMIN HUMAN 25 % IV SOLN
25.0000 g | Freq: Once | INTRAVENOUS | Status: AC
  Administered 2020-04-25: 25 g via INTRAVENOUS
  Filled 2020-04-25: qty 100

## 2020-04-25 MED ORDER — ALBUMIN HUMAN 25 % IV SOLN
12.5000 g | Freq: Once | INTRAVENOUS | Status: AC
  Administered 2020-04-25: 12.5 g via INTRAVENOUS
  Filled 2020-04-25: qty 50

## 2020-04-25 NOTE — Progress Notes (Signed)
Brief cross cover note RN reports BP systolic BP's in 80' since arrival to floor. Given current COVID 19 pneumonia and low albumin state albumin ordered for BP support. RN also reported drug paraphenilia noted in patient room. Nursing/facility protocol for removal of items followed per RN report.

## 2020-04-25 NOTE — Care Management (Signed)
This is a no charge note  Pick up from [facility], per Dr. Karna Christmas   64 year old lady with  past medical history of hypertension, COPD, HCV, cocaine abuse, who was admitted on 5/14 due to COVID-19 infection.  Patient also has positive blood culture for Streptococcus species on Rocephin.  Initially patient had low hemoglobin, which comes back to her baseline without blood transfusion.  GI signed off.  Patient was not intubated.  Currently her saturations 94% on 2 L nasal cannula oxygen.   Lorretta Harp, MD  [facility]   If 7PM-7AM, please contact night-coverage [email or web address] 04/25/2020, 10:44 AM

## 2020-04-25 NOTE — Progress Notes (Signed)
Pharmacy Electrolyte Monitoring Consult:  Pharmacy consulted to assist in monitoring and replacing electrolytes in this 64 y.o. female admitted on 04/20/2020 with GI Bleed, COVID 19 pneumonia, and cocaine abuse.   Labs:  Sodium (mmol/L)  Date Value  04/25/2020 138  03/13/2014 136   Potassium (mmol/L)  Date Value  04/25/2020 3.7  03/13/2014 3.7   Magnesium (mg/dL)  Date Value  86/75/4492 1.8   Phosphorus (mg/dL)  Date Value  01/00/7121 3.1   Calcium (mg/dL)  Date Value  97/58/8325 7.3 (L)   Calcium, Total (mg/dL)  Date Value  49/82/6415 8.3 (L)   Albumin (g/dL)  Date Value  83/08/4075 2.3 (L)  03/14/2014 2.3 (L)    Assessment/Plan:  Electrolytes:  Goal electrolytes WNL.  Currently receiving LR.  Will give magnesium sulfate 2 g IV x 1 dose.  Will obtain labs in the AM.   Glucose:  Glucose trending low.  Currently on day 6 of IV steroids.  Patient remains on SSI for now.  Will continue to follow.  Constipation: Last BM 5/14.  On Miralax.  Will add Senokot-S.  Pharmacy will continue to monitor and adjust per consult.   Cherly Hensen, PharmD 04/25/2020 3:46 PM

## 2020-04-25 NOTE — Progress Notes (Signed)
Escorted [employer] into room to remove contraband that  Pt's RN found in pt's bed. BP officer removed contraband.

## 2020-04-25 NOTE — Progress Notes (Signed)
CRITICAL CARE NOTE 64 y.o.femalewith a known history of COPD, protein calorie malnutrition, hepatitis C, hypertension, IV drug abuse +COVID pneumonia and severe anemia  -Patient's [facility]/significant [facility]passed [facility] on [facility] (he was admitted there for COVID and died of complication, she is not coping well withthis.   -She did smoke crack on Wednesdayper records.  patient had cocaine use this morning. She did report black stools.Patient c/o severe headache and chest pain and requesting pain meds.  5/14 admitted for severe anemia, COVID 19 pneumonia and COCAINE ABUSE 5/15 severe encepphalopathy, high risk for intubation, on precedex 5/16 severe encephalopathy, high risk for intubation on precedex 5/16 +BLOOD CULTURES STREPTOCOCCUS SPECIES 5/17 - no acute events today  04/24/20 - patient is now on 2L [place] , she does have episodes of yelling, she shares that she likes to speak loudly but seems tangential in speech. She is hemodynamically stable 04/25/20- patient clinically improved , plan to transfer to medical floor  CC  follow up DT's COVID 19 penumonia   HPI Patient remains critically ill Prognosis is guarded Severe toxic metabolic encephalopathy    BP 117/81   Pulse 64   Temp (!) 97 F (36.1 C) (Axillary)   Resp 15   Ht 5\' 2"  (1.575 m)   Wt 72 kg   SpO2 94%   BMI 29.03 kg/m    I/O last 3 completed shifts: In: 2294.7 [I.V.:2081.3; IV Piggyback:213.4] Out: 3474 [Urine:4050] No intake/output data recorded.  SpO2: 94 % O2 Flow Rate (L/min): 2 L/min FiO2 (%): (!) 5 %  Estimated body mass index is 29.03 kg/m as calculated from the following:   Height as of this encounter: 5\' 2"  (1.575 m).   Weight as of this encounter: 72 kg.  SIGNIFICANT EVENTS   REVIEW OF SYSTEMS  PATIENT IS UNABLE TO PROVIDE COMPLETE REVIEW OF SYSTEMS DUE TO SEVERE CRITICAL ILLNESS        PHYSICAL EXAMINATION:  GENERAL:critically ill appearing,   HEAD: Normocephalic, atraumatic.  EYES: Pupils equal, round, reactive to light.  No scleral icterus.  MOUTH: Moist mucosal membrane. NECK: Supple.  PULMONARY: +rhonchi,bilaterally CARDIOVASCULAR: S1 and S2. Regular rate and rhythm. No murmurs, rubs, or gallops.  GASTROINTESTINAL: Soft, nontender, -distended.  Positive bowel sounds.   MUSCULOSKELETAL: No swelling, clubbing, or edema.  NEUROLOGIC: obtunded, GCS<8 SKIN:intact,warm,dry  MEDICATIONS: I have reviewed all medications and confirmed regimen as documented   CULTURE RESULTS   Recent Results (from the past 240 hour(s))  MRSA PCR Screening     Status: Abnormal   Collection Time: 04/20/20 11:27 AM  Result Value Ref Range Status   MRSA by PCR POSITIVE (A) NEGATIVE Final    Comment:        The GeneXpert MRSA Assay (FDA approved for NASAL specimens only), is one component of a comprehensive MRSA colonization surveillance program. It is not intended to diagnose MRSA infection nor to guide or monitor treatment for MRSA infections. CRITICAL RESULT CALLED TO, READ BACK BY AND VERIFIED WITH: MIRANBA ROBLES @1921  04/20/2020 TTG Performed at [facility], [address] [place], [place] [address]   Blood Culture (routine x 2)     Status: Abnormal   Collection Time: 04/20/20 11:54 AM   Specimen: BLOOD  Result Value Ref Range Status   Specimen Description   Final    BLOOD LT [facility] Performed at [facility], [address] [place],  [address]    Special Requests   Final    BOTTLES DRAWN AEROBIC  AND ANAEROBIC Blood Culture results may not be optimal due to an excessive volume of blood received in culture bottles Performed at [facility], [address] [place], [place] [address]    Culture  Setup Time   Final    Organism ID to follow GRAM POSITIVE COCCI IN BOTH AEROBIC AND ANAEROBIC BOTTLES CRITICAL RESULT CALLED TO, READ BACK BY AND VERIFIED WITH: ALEX CHAPPELL ON 04/21/2020 AT 1231  TIK Performed at [facility], [address] [place], [place] [address]    Culture (A)  Final    VIRIDANS STREPTOCOCCUS STAPHYLOCOCCUS SPECIES (COAGULASE NEGATIVE) THE SIGNIFICANCE OF ISOLATING THIS ORGANISM FROM A SINGLE SET OF BLOOD CULTURES WHEN MULTIPLE SETS ARE DRAWN IS UNCERTAIN. PLEASE NOTIFY THE MICROBIOLOGY DEPARTMENT WITHIN ONE WEEK IF SPECIATION AND SENSITIVITIES ARE REQUIRED. Performed at [facility], [address] [place], [place] [address]    Report Status 04/24/2020 FINAL  Final  Blood Culture (routine x 2)     Status: None   Collection Time: 04/20/20 11:54 AM   Specimen: BLOOD  Result Value Ref Range Status   Specimen Description BLOOD RT [facility]  Final   Special Requests   Final    BOTTLES DRAWN AEROBIC AND ANAEROBIC Blood Culture adequate volume   Culture   Final    NO GROWTH 5 DAYS Performed at [facility], [address] [place], [place] [address]    Report Status 04/25/2020 FINAL  Final  Blood Culture ID Panel (Reflexed)     Status: Abnormal   Collection Time: 04/20/20 11:54 AM  Result Value Ref Range Status   Enterococcus species NOT DETECTED NOT DETECTED Final   Listeria monocytogenes NOT DETECTED NOT DETECTED Final   Staphylococcus species NOT DETECTED NOT DETECTED Final   Staphylococcus aureus (BCID) NOT DETECTED NOT DETECTED Final   Streptococcus species DETECTED (A) NOT DETECTED Final    Comment: Not Enterococcus species, Streptococcus agalactiae, Streptococcus pyogenes, or Streptococcus pneumoniae. CRITICAL RESULT CALLED TO, READ BACK BY AND VERIFIED WITH: ALEX CHAPPELL ON 04/21/2020 AT 1231 TIK    Streptococcus agalactiae NOT DETECTED NOT DETECTED Final   Streptococcus pneumoniae NOT DETECTED NOT DETECTED Final   Streptococcus pyogenes NOT DETECTED NOT DETECTED Final   Acinetobacter baumannii NOT DETECTED NOT DETECTED Final   Enterobacteriaceae species NOT DETECTED NOT DETECTED Final   Enterobacter cloacae complex NOT  DETECTED NOT DETECTED Final   Escherichia coli NOT DETECTED NOT DETECTED Final   Klebsiella oxytoca NOT DETECTED NOT DETECTED Final   Klebsiella pneumoniae NOT DETECTED NOT DETECTED Final   Proteus species NOT DETECTED NOT DETECTED Final   Serratia marcescens NOT DETECTED NOT DETECTED Final   Haemophilus influenzae NOT DETECTED NOT DETECTED Final   Neisseria meningitidis NOT DETECTED NOT DETECTED Final   Pseudomonas aeruginosa NOT DETECTED NOT DETECTED Final   Candida albicans NOT DETECTED NOT DETECTED Final   Candida glabrata NOT DETECTED NOT DETECTED Final   Candida krusei NOT DETECTED NOT DETECTED Final   Candida parapsilosis NOT DETECTED NOT DETECTED Final   Candida tropicalis NOT DETECTED NOT DETECTED Final    Comment: Performed at [facility], [address] [place], [place] [address]  SARS Coronavirus 2 by RT PCR (hospital order, performed in [facility] hospital lab) Nasopharyngeal Nasopharyngeal Swab     Status: Abnormal   Collection Time: 04/20/20 12:57 PM   Specimen: Nasopharyngeal Swab  Result Value Ref Range Status   SARS Coronavirus 2 POSITIVE (A) NEGATIVE Final    Comment: RESULT CALLED  TO, READ BACK BY AND VERIFIED WITH:  LEXIE OLIVER AT 1416 04/20/20 (NOTE) SARS-CoV-2 target nucleic acids are DETECTED SARS-CoV-2 RNA is generally detectable in upper respiratory specimens  during the acute phase of infection.  Positive results are indicative  of the presence of the identified virus, but do not rule out bacterial infection or co-infection with other pathogens not detected by the test.  Clinical correlation with patient history and  other diagnostic information is necessary to determine patient infection status.  The expected result is negative. Fact Sheet for Patients:   BoilerBrush.com.cy  Fact Sheet for Healthcare Providers:   https://pope.com/   This test is not yet approved or cleared by the [place]  FDA and  has been authorized for detection and/or diagnosis of SARS-CoV-2 by FDA under an Emergency Use Authorization (EUA).  This EUA will remain in effect (meaning this test can be use d) for the duration of  the COVID-19 declaration under Section 564(b)(1) of the Act, 21 U.S.C. section 360-bbb-3(b)(1), unless the authorization is terminated or revoked sooner. Performed at [facility], [address] [place], [place] [address]         CBC    Component Value Date/Time   WBC 3.8 (L) 04/25/2020 0351   RBC 4.00 04/25/2020 0351   HGB 10.2 (L) 04/25/2020 1115   HGB 13.7 03/13/2014 1603   HCT 34.6 (L) 04/25/2020 1115   HCT 41.7 03/13/2014 1603   PLT 65 (L) 04/25/2020 0351   PLT 100 (L) 03/13/2014 1603   MCV 83.5 04/25/2020 0351   MCV 102 (H) 03/13/2014 1603   MCH 24.8 (L) 04/25/2020 0351   MCHC 29.6 (L) 04/25/2020 0351   RDW 20.9 (H) 04/25/2020 0351   RDW 15.1 (H) 03/13/2014 1603   LYMPHSABS 0.6 (L) 04/25/2020 0351   MONOABS 0.2 04/25/2020 0351   EOSABS 0.1 04/25/2020 0351   BASOSABS 0.0 04/25/2020 0351   BMP Latest Ref Rng & Units 04/25/2020 04/24/2020 04/23/2020  Glucose 70 - 99 mg/dL 95 90 99  BUN 8 - 23 mg/dL 17 23 56(D)  Creatinine 0.44 - 1.00 mg/dL 1.49 7.02 6.37(C)  Sodium 135 - 145 mmol/L 138 140 141  Potassium 3.5 - 5.1 mmol/L 3.7 4.1 4.1  Chloride 98 - 111 mmol/L 106 108 112(H)  CO2 22 - 32 mmol/L 29 26 23   Calcium 8.9 - 10.3 mg/dL 7.3(L) 7.3(L) 7.5(L)     IMAGING    No results found.   Nutrition Status: Nutrition Problem: Increased nutrient needs Etiology: catabolic illness(Pneumonia secondary to COVID-19 virus infection) Signs/Symptoms: estimated needs Interventions: Tube feeding, Boost Breeze, MVI     Indwelling Urinary Catheter continued, requirement due to   Reason to continue Indwelling Urinary Catheter strict Intake/Output monitoring for hemodynamic instability     ASSESSMENT AND PLAN  Acute hypoxemic respiratory faliure -due  to COVID19 pnemonia -remdesevir -vit c/z -dexamethasone -s/p ivermectin - supportive care -ICU telemetry monitoring   Polysubstance abuse  -cocaine and alcohol abuse actively -monitoro for signs/symptoms of wihtdrawal and seizure activity -Therapy with Thiamine and MVI -CIWA Protocol -Precedex as needed -High risk for intubation  -high risk for aspiration   Sepsis -present on admission  -due to strep species per microbiology -reviewed with pharmacy - possible contaminant recommendation for 5d rocephin only   GI GI PROPHYLAXIS as indicated  NUTRITIONAL STATUS Nutrition Status: Nutrition Problem: Increased nutrient needs Etiology: catabolic illness(Pneumonia secondary to COVID-19 virus infection) Signs/Symptoms: estimated needs Interventions: Tube feeding, Boost Breeze, MVI   DIET-->NPO  ENDO - will use ICU hypoglycemic\Hyperglycemia protocol if indicated     ELECTROLYTES -follow labs as needed -replace as needed -pharmacy consultation and following   DVT/GI PRX ordered and assessed TRANSFUSIONS AS NEEDED MONITOR FSBS  CASE DISCUSSED IN MULTIDISCIPLINARY ROUNDS WITH ICU TEAM  Critical Care Time devoted to patient care services described in this note is 34 minutes.   Overall, patient is critically ill, prognosis is guarded.  Patient with Multiorgan failure and at high risk for cardiac arrest and death.     Vida Rigger, M.D.  Pulmonary & Critical Care Medicine  [facility]

## 2020-04-25 NOTE — Progress Notes (Signed)
Patient transferred to this RN and CNA to Rm 110. Patient has all personal belongings on her, as well as checked for incontinence (C/D), purewick in place, patient on RA., O2 saturation 92%.    Report given to [place], Charity fundraiser.  This RN notified POA of transfer to med surg floor, and Charge RN aware of transfer.

## 2020-04-25 NOTE — TOC Initial Note (Addendum)
Transition of Care [facility]) - Initial/Assessment Note    Patient Details  Name: Kylie Monroe MRN: [id number] Date of Birth: 1956-02-14  Transition of Care [facility]) CM/SW Contact:    Liliana Cline, LCSW Phone Number: 04/25/2020, 12:39 PM  Clinical Narrative:                Patient is disoriented x 3 per chart and rounds.   CSW spoke with patient's daughter, [name], via phone, for Readmission Screening and to discuss discharge plans per her request.  [name] reported patient was living with her boyfriend of 20 years until he passed away about a week ago and patient has not been coping well emotionally with this loss. [name] reported the home patient was living in is a modular home on land that is in her boyfriend's name and he owes a substantial amount of taxes on it, so [place] expects this will be taken away. [name] reported patient has had an ongoing problem with substance abuse since age 68 and was doing better until her boyfriend passed away. [name] reported she has concerns about patient returning to living alone and said patient would not be able to stay with her or other family members. [name] reported she lives in [place] but is planning to move closer to this area to be near her mother and other family that she cares for. [name] reported she feels patient needs to live somewhere where she will have supervision like a SNF, but she is not sure if patient would agree to this (once patient is more alert & oriented). [name] reported she is patient's POA.  CSW explained that SNF placement would be based on PT/OT recommendations and CSW will ask MD to order PT/OT consults when MD feels it is medically appropriate for patient, then options can be discussed whether it is SNF, group home, etc. [name] agreed.   [name] reported she is not sure who patient's PCP is or which Pharmacy patient uses. Per chart review, PCP is Eula Flax, NP. Patient's boyfriend was providing transport to appointments before his  passing. Patient has home oxygen, no other equipment that [name] is aware of. Patient has never been to a SNF per [place]. Patient had Home Health in the past, [name] could not recall which agency.   CSW updated MD and RN and will continue to follow.      Barriers to Discharge: Continued Medical Work up   Patient Goals and CMS Choice   CMS Medicare.gov Compare Post Acute Care list provided to:: Patient Represenative (must comment) Choice offered to / list presented to : Adult Children  Expected Discharge Plan and Services         Living arrangements for the past 2 months: Single Family Home                                      Prior Living Arrangements/Services Living arrangements for the past 2 months: Single Family Home Lives with:: Self(Was living with significant other until he passed away last week.) Patient language and need for interpreter reviewed:: Yes        Need for Family Participation in Patient Care: Yes (Comment) Care giver support system in place?: Yes (comment) Current home services: DME Criminal Activity/Legal Involvement Pertinent to Current Situation/Hospitalization: No - Comment as needed  Activities of Daily Living Home Assistive Devices/Equipment: Cane (specify quad or straight) ADL Screening (condition at time  of admission) Patient's cognitive ability adequate to safely complete daily activities?: Yes Is the patient deaf or have difficulty hearing?: Yes Does the patient have difficulty seeing, even when wearing glasses/contacts?: Yes Does the patient have difficulty concentrating, remembering, or making decisions?: Yes Patient able to express need for assistance with ADLs?: Yes Does the patient have difficulty dressing or bathing?: Yes Independently performs ADLs?: Yes (appropriate for developmental age) Does the patient have difficulty walking or climbing stairs?: No Weakness of Legs: None Weakness of Arms/Hands: None  Permission  Sought/Granted Permission sought to share information with : Facility Art therapist granted to share information with : Yes, Verbal Permission Granted(permission granted by daughter)              Emotional Assessment       Orientation: : Fluctuating Orientation (Suspected and/or reported Sundowners) Alcohol / Substance Use: Illicit Drugs Psych Involvement: No (comment)  Admission diagnosis:  Melena [K92.1] Anemia, unspecified type [D64.9] Community acquired pneumonia, unspecified laterality [J18.9] Pneumonia due to COVID-19 virus [U07.1, J12.82] COVID-19 [U07.1] Patient Active Problem List   Diagnosis Date Noted  . Melena   . Pneumonia due to COVID-19 virus 04/20/2020  . Anemia   . COVID-19   . Protein-calorie malnutrition, severe 02/23/2019  . Substance abuse ([place])   . Pressure injury of skin 11/02/2018  . DNR (do not resuscitate) discussion   . Palliative care by specialist   . Malnutrition of moderate degree 10/26/2018  . Pneumonia   . Septic shock ([place])   . Acute respiratory failure with hypoxia ([place]) 10/24/2018  . Leukopenia 01/24/2018  . Thrombocytopenia ([place]) 01/24/2018  . Other pancytopenia ([place]) 12/25/2017  . Iron deficiency anemia 12/25/2017   PCP:  Alwyn Pea, NP Pharmacy:   [id number] [place], [address] [address] Phone: [phone] Fax: [phone]     Social Determinants of Health (SDOH) Interventions    Readmission Risk Interventions Readmission Risk Prevention Plan 04/25/2020  Transportation Screening Complete  PCP or Specialist Appt within 3-5 Days Complete  HRI or Home Care Consult Complete  Medication Review (RN Care Manager) Complete  Some recent data might be hidden

## 2020-04-26 ENCOUNTER — Inpatient Hospital Stay: Payer: Medicaid Other

## 2020-04-26 DIAGNOSIS — R509 Fever, unspecified: Secondary | ICD-10-CM

## 2020-04-26 LAB — BASIC METABOLIC PANEL
Anion gap: 8 (ref 5–15)
BUN: 15 mg/dL (ref 8–23)
CO2: 31 mmol/L (ref 22–32)
Calcium: 7.6 mg/dL — ABNORMAL LOW (ref 8.9–10.3)
Chloride: 100 mmol/L (ref 98–111)
Creatinine, Ser: 0.7 mg/dL (ref 0.44–1.00)
GFR calc Af Amer: >60 mL/min (ref >60)
GFR calc non Af Amer: >60 mL/min (ref >60)
Glucose, Bld: 81 mg/dL (ref 70–99)
Potassium: 3.3 mmol/L — ABNORMAL LOW (ref 3.5–5.1)
Sodium: 139 mmol/L (ref 135–145)

## 2020-04-26 LAB — PHOSPHORUS: Phosphorus: 2.3 mg/dL — ABNORMAL LOW (ref 2.5–4.6)

## 2020-04-26 LAB — GLUCOSE, CAPILLARY
Glucose-Capillary: 113 mg/dL — ABNORMAL HIGH (ref 70–99)
Glucose-Capillary: 85 mg/dL (ref 70–99)
Glucose-Capillary: 188 mg/dL — ABNORMAL HIGH (ref 70–99)
Glucose-Capillary: 117 mg/dL — ABNORMAL HIGH (ref 70–99)
Glucose-Capillary: 147 mg/dL — ABNORMAL HIGH (ref 70–99)

## 2020-04-26 LAB — HEMOGLOBIN AND HEMATOCRIT, BLOOD
HCT: 32.5 % — ABNORMAL LOW (ref 36.0–46.0)
Hemoglobin: 9.8 g/dL — ABNORMAL LOW (ref 12.0–15.0)

## 2020-04-26 LAB — MAGNESIUM: Magnesium: 1.8 mg/dL (ref 1.7–2.4)

## 2020-04-26 MED ORDER — POTASSIUM PHOSPHATES 15 MMOLE/5ML IV SOLN
10.0000 mmol | Freq: Once | INTRAVENOUS | Status: AC
  Administered 2020-04-26: 17:00:00 10 mmol via INTRAVENOUS
  Filled 2020-04-26: qty 3.33

## 2020-04-26 MED ORDER — SODIUM CHLORIDE 0.9 % IV SOLN
2.0000 g | INTRAVENOUS | Status: DC
  Filled 2020-04-26 (×5): qty 20

## 2020-04-26 MED ORDER — SODIUM CHLORIDE 0.9 % IV SOLN: 1.0000 g | INTRAVENOUS | Status: DC

## 2020-04-26 MED ORDER — SODIUM CHLORIDE 0.9 % IV SOLN
300.0000 mg | Freq: Once | INTRAVENOUS | Status: AC
  Administered 2020-04-26: 300 mg via INTRAVENOUS
  Filled 2020-04-26: qty 15

## 2020-04-26 MED ORDER — ENSURE ENLIVE PO LIQD
237.0000 mL | Freq: Three times a day (TID) | ORAL | Status: DC
  Administered 2020-04-26 – 2020-04-28 (×6): 237 mL via ORAL

## 2020-04-26 NOTE — Progress Notes (Signed)
Patient was found with drug paraphernalia last night and we are questioning if she is using controlled substances other than those prescribed. We obtained consent from Legal Guardian Oda Cogan patients daughter to search belongings, Lance Bosch AD was the second verifier. Items found are now stored by security and pharmacy.

## 2020-04-26 NOTE — Progress Notes (Signed)
Assisted in a bath and bed change for patient and when linen came off her pack of cigarettes fell on the ground and inside was drug paraphernalia (a pipe). [employer] called in pt begged "please don't call I dont want to go to jail".

## 2020-04-26 NOTE — Progress Notes (Signed)
Spoke to MD regarding concerns of patient hurting herself/staff. Patient threatening staff and is not redirectable. MD made aware and to see patient.   Bo Mcclintock, RN

## 2020-04-26 NOTE — Progress Notes (Signed)
PROGRESS NOTE    Kylie Monroe  PYP:[id number] DOB: 1956/05/31 DOA: 04/20/2020 PCP: Alwyn Pea, NP   Brief Narrative: Patient is a 64 year old female with history of COPD, protein calorie malnutrition, hepatitis C, cirrhosis with splenomegaly, portal hypertension, essential hypertension and IV drug abuse who was initially admitted to the hospital on 5/14 with diagnosis of Covid pneumonia, severe anemia with hemoglobin 2.3.  Her last use of cocaine was before admission. Chest x-ray at admission showed multifocal pneumonia, more on the right side.  She is managed in ICU, she was also seen by gastroenterology, octreotide was started due to concern for upper GI bleed with portal hypertension.  EGD was deferred until patient recovered from her pneumonia. Patient will transfer out of ICU on 5/19.  Octreotide was discontinued before transfer, hemoglobin has been stable for a few days.  Her initial blood culture positive for coag negative staph in 1 out of 2 bottles.  Appears to be skin flora. Patient also had a severe acute metabolic encephalopathy, was initially treated with Precedex.  Patient was called using cocaine in her room upon transfer to medical floor on 04/25/2020.  Assessment & Plan:   Active Problems:   Pneumonia due to COVID-19 virus   Anemia   COVID-19   Melena  #1.  Acute hypoxemic respiratory failure secondary to COVID-19 pneumonia. Condition is more stable now.  Patient is still on 2 L oxygen.  No significant shortness of breath.  We will check a BNP and procalcitonin level tomorrow.  2.  Severe acute on chronic blood loss anemia.  With severe iron deficient anemia. Patient had received multiple units of blood transfusion.  Hemoglobin now is stable.  She has severe iron deficiency, will give IV iron today.  Patient will need outpatient EGD when her condition improves.  #3.  Sepsis secondary to Covid infection. Condition had improved.  Patient had a positive blood culture  in 1 of 2 bottles with coag negative staph, most likely skin contaminants.  Currently she is receiving 5 days of Rocephin, will discontinue after completion.  #4.  Polysubstance abuse. Patient still try to use cocaine while in the hospital.  She has been very agitated today.  We will follow closely for possibility of aspiration pneumonia.  She also has high risk for developing acute respite failure.  #5.  Moderate protein calorie malnutrition. Supplement.  #6.  New fever. Repeat chest x-ray to rule out aspiration pneumonia.  Repeat blood cultures.  Continue current antibiotics for now.   #7.  Diarrhea. Patient had a multiple loose stools today after giving 2 softeners.  We will continue to monitor after discontinuing stool softeners.  #8.  Hypokalemia and hypophosphatemia. Supplement.  She is at high risk for developing acute respite failure or even cardiac arrest due to drug abuse.  DVT prophylaxis: SCDs Code Status: Full Family Communication:  None  Disposition Plan:  . Patient came from:Home            . Anticipated d/c place: May need SNF . Barriers to d/c OR conditions which need to be met to effect a safe d/c:   Consultants:   ICU  Procedures: None Antimicrobials: Rocephin  Subjective: Patient developed a fever today.  She has not had a fever since admission prior to this. She was found in her room using cocaine yesterday.  She has been very agitated today, she is sleepy now after giving Geodon as scheduled. Patient also having diarrhea after taking stool softeners, stool softener was discontinued.  She does not have any abdominal pain or nausea vomiting. She is on 2 L oxygen, no significant shortness of breath.  No cough.  Objective: Vitals:   04/26/20 0200 04/26/20 0800 04/26/20 0938 04/26/20 1000  BP: 117/61 (!) 125/59  (!) 108/53  Pulse: 98 92  92  Resp: 18 20 16 19   Temp:  (!) 102.1 F (38.9 C) 99.6 F (37.6 C) 99.5 F (37.5 C)  TempSrc:  Oral Oral Oral    SpO2:      Weight:      Height:        Intake/Output Summary (Last 24 hours) at 04/26/2020 1416 Last data filed at 04/26/2020 0334 Gross per 24 hour  Intake --  Output 2000 ml  Net -2000 ml   Filed Weights   04/20/20 1045 04/24/20 0500 04/25/20 0500  Weight: 63.5 kg 73.6 kg 72 kg    Examination:  General exam: Appears calm and comfortable, Malnurished Respiratory system: Clear to auscultation. Respiratory effort normal. Cardiovascular system: S1 & S2 heard, RRR. No JVD, murmurs, rubs, gallops or clicks. No pedal edema. Gastrointestinal system: Abdomen is nondistended, soft and nontender. No organomegaly or masses felt. Normal bowel sounds heard. Central nervous system: Drowsy and arousable.  No focal neurological deficits. Extremities: Symmetric  Skin: No rashes, lesions or ulcers Psychiatry: Not able to assess.     Data Reviewed: I have personally reviewed following labs and imaging studies  CBC: Recent Labs  Lab 04/21/20 0348 04/21/20 1118 04/22/20 0321 04/22/20 0726 04/23/20 2041 04/23/20 2041 04/24/20 0358 04/24/20 3159 04/25/20 0351 04/25/20 0351 04/25/20 0811 04/25/20 1115 04/25/20 1632 04/25/20 2032 04/25/20 2327  WBC 2.7*  --  5.2  --  3.4*  --  4.4  --  3.8*  --   --   --   --   --   --   NEUTROABS 2.3  --  4.3  --  2.8  --  3.5  --  2.9  --   --   --   --   --   --   HGB 6.5*   < > 8.5*   < > 10.1*   < > 10.7*   < > 9.9*   < > 10.1* 10.2* 10.9* 10.7* 9.8*  HCT 22.3*   < > 27.0*   < > 34.0*   < > 35.4*   < > 33.4*   < > 34.2* 34.6* 35.3* 36.5 32.5*  MCV 79.9*  --  78.5*  --  83.5  --  82.3  --  83.5  --   --   --   --   --   --   PLT 91*  --  103*  --  68*  --  74*  --  65*  --   --   --   --   --   --    < > = values in this interval not displayed.   Basic Metabolic Panel: Recent Labs  Lab 04/22/20 0321 04/23/20 0443 04/24/20 0358 04/25/20 0351 04/26/20 0756  NA 138 141 140 138 139  K 4.5 4.1 4.1 3.7 3.3*  CL 109 112* 108 106 100  CO2 22  23 26 29 31   GLUCOSE 102* 99 90 95 81  BUN 22 26* 23 17 15   CREATININE 0.85 1.01* 0.95 0.72 0.70  CALCIUM 7.5* 7.5* 7.3* 7.3* 7.6*  MG 2.1 2.0 1.9 1.8 1.8  PHOS 3.5 3.6 3.9 3.1 2.3*   GFR: Estimated Creatinine Clearance:  67.8 mL/min (by C-G formula based on SCr of 0.7 mg/dL). Liver Function Tests: Recent Labs  Lab 04/21/20 0348 04/22/20 0321 04/23/20 0443 04/24/20 0358 04/25/20 0351  AST 20 16 17 19 19   ALT 11 10 10 9 9   ALKPHOS 53 51 49 51 51  BILITOT 2.0* 0.8 0.7 0.6 0.6  PROT 6.3* 6.1* 5.8* 5.8* 5.4*  ALBUMIN 2.5* 2.5* 2.3* 2.3* 2.3*   No results for input(s): LIPASE, AMYLASE in the last 168 hours. Recent Labs  Lab 04/23/20 1448  AMMONIA 28   Coagulation Profile: Recent Labs  Lab 04/20/20 1142  INR 1.7*   Cardiac Enzymes: No results for input(s): CKTOTAL, CKMB, CKMBINDEX, TROPONINI in the last 168 hours. BNP (last 3 results) No results for input(s): PROBNP in the last 8760 hours. HbA1C: No results for input(s): HGBA1C in the last 72 hours. CBG: Recent Labs  Lab 04/25/20 2008 04/25/20 2327 04/26/20 0329 04/26/20 0827 04/26/20 1201  GLUCAP 94 104* 113* 85 188*   Lipid Profile: No results for input(s): CHOL, HDL, LDLCALC, TRIG, CHOLHDL, LDLDIRECT in the last 72 hours. Thyroid Function Tests: No results for input(s): TSH, T4TOTAL, FREET4, T3FREE, THYROIDAB in the last 72 hours. Anemia Panel: Recent Labs    04/24/20 0358 04/25/20 0351  FERRITIN 14 11   Sepsis Labs: Recent Labs  Lab 04/20/20 1342 04/20/20 1824  PROCALCITON  --  <0.10  LATICACIDVEN 2.5* 1.4    Recent Results (from the past 240 hour(s))  MRSA PCR Screening     Status: Abnormal   Collection Time: 04/20/20 11:27 AM  Result Value Ref Range Status   MRSA by PCR POSITIVE (A) NEGATIVE Final    Comment:        The GeneXpert MRSA Assay (FDA approved for NASAL specimens only), is one component of a comprehensive MRSA colonization surveillance program. It is not intended to  diagnose MRSA infection nor to guide or monitor treatment for MRSA infections. CRITICAL RESULT CALLED TO, READ BACK BY AND VERIFIED WITH: MIRANBA ROBLES @1921  04/20/2020 TTG Performed at [facility], [address], [place], [place] [address]   Blood Culture (routine x 2)     Status: Abnormal   Collection Time: 04/20/20 11:54 AM   Specimen: BLOOD  Result Value Ref Range Status   Specimen Description   Final    BLOOD LT [facility] Performed at [facility], [address] [place], [place] [address]    Special Requests   Final    BOTTLES DRAWN AEROBIC AND ANAEROBIC Blood Culture results may not be optimal due to an excessive volume of blood received in culture bottles Performed at [facility], [address] [place], [place] [address]    Culture  Setup Time   Final    Organism ID to follow [place] CRITICAL RESULT CALLED TO, READ BACK BY AND VERIFIED WITH: ALEX CHAPPELL ON 04/21/2020 AT 1231 TIK Performed at [facility], [address] [place], [place] [address]    Culture (A)  Final    VIRIDANS STREPTOCOCCUS STAPHYLOCOCCUS SPECIES (COAGULASE NEGATIVE) THE SIGNIFICANCE OF ISOLATING THIS ORGANISM FROM A SINGLE SET OF BLOOD CULTURES WHEN MULTIPLE SETS ARE DRAWN IS UNCERTAIN. PLEASE NOTIFY THE MICROBIOLOGY DEPARTMENT WITHIN ONE WEEK IF SPECIATION AND SENSITIVITIES ARE REQUIRED. Performed at [facility], [place] [address], [place], [place] [address]    Report Status 04/24/2020 FINAL  Final  Blood Culture (routine x 2)     Status: None  Collection Time: 04/20/20 11:54 AM   Specimen: BLOOD  Result Value Ref Range Status   Specimen Description BLOOD RT [facility]  Final   Special Requests   Final    BOTTLES DRAWN AEROBIC AND ANAEROBIC Blood Culture adequate volume   Culture   Final    NO GROWTH 5 DAYS Performed at [facility], [address] [place], [place] [address]    Report Status  04/25/2020 FINAL  Final  Blood Culture ID Panel (Reflexed)     Status: Abnormal   Collection Time: 04/20/20 11:54 AM  Result Value Ref Range Status   Enterococcus species NOT DETECTED NOT DETECTED Final   Listeria monocytogenes NOT DETECTED NOT DETECTED Final   Staphylococcus species NOT DETECTED NOT DETECTED Final   Staphylococcus aureus (BCID) NOT DETECTED NOT DETECTED Final   Streptococcus species DETECTED (A) NOT DETECTED Final    Comment: Not Enterococcus species, Streptococcus agalactiae, Streptococcus pyogenes, or Streptococcus pneumoniae. CRITICAL RESULT CALLED TO, READ BACK BY AND VERIFIED WITH: ALEX CHAPPELL ON 04/21/2020 AT 1231 TIK    Streptococcus agalactiae NOT DETECTED NOT DETECTED Final   Streptococcus pneumoniae NOT DETECTED NOT DETECTED Final   Streptococcus pyogenes NOT DETECTED NOT DETECTED Final   Acinetobacter baumannii NOT DETECTED NOT DETECTED Final   Enterobacteriaceae species NOT DETECTED NOT DETECTED Final   Enterobacter cloacae complex NOT DETECTED NOT DETECTED Final   Escherichia coli NOT DETECTED NOT DETECTED Final   Klebsiella oxytoca NOT DETECTED NOT DETECTED Final   Klebsiella pneumoniae NOT DETECTED NOT DETECTED Final   Proteus species NOT DETECTED NOT DETECTED Final   Serratia marcescens NOT DETECTED NOT DETECTED Final   Haemophilus influenzae NOT DETECTED NOT DETECTED Final   Neisseria meningitidis NOT DETECTED NOT DETECTED Final   Pseudomonas aeruginosa NOT DETECTED NOT DETECTED Final   Candida albicans NOT DETECTED NOT DETECTED Final   Candida glabrata NOT DETECTED NOT DETECTED Final   Candida krusei NOT DETECTED NOT DETECTED Final   Candida parapsilosis NOT DETECTED NOT DETECTED Final   Candida tropicalis NOT DETECTED NOT DETECTED Final    Comment: Performed at [facility], [address] [place],  [address]  SARS Coronavirus 2 by RT PCR (hospital order, performed in [place] hospital lab) Nasopharyngeal Nasopharyngeal Swab      Status: Abnormal   Collection Time: 04/20/20 12:57 PM   Specimen: Nasopharyngeal Swab  Result Value Ref Range Status   SARS Coronavirus 2 POSITIVE (A) NEGATIVE Final    Comment: RESULT CALLED TO, READ BACK BY AND VERIFIED WITH:  LEXIE OLIVER AT 0981 04/20/20 (NOTE) SARS-CoV-2 target nucleic acids are DETECTED SARS-CoV-2 RNA is generally detectable in upper respiratory specimens  during the acute phase of infection.  Positive results are indicative  of the presence of the identified virus, but do not rule out bacterial infection or co-infection with other pathogens not detected by the test.  Clinical correlation with patient history and  other diagnostic information is necessary to determine patient infection status.  The expected result is negative. Fact Sheet for Patients:   StrictlyIdeas.no  Fact Sheet for Healthcare Providers:   BankingDealers.co.za   This test is not yet approved or cleared by the [place] FDA and  has been authorized for detection and/or diagnosis of SARS-CoV-2 by FDA under an Emergency Use Authorization (EUA).  This EUA will remain in effect (meaning this test can be use d) for the duration of  the COVID-19 declaration under Section 564(b)(1) of the Act, 21 U.S.C. section 360-bbb-3(b)(1), unless  the authorization is terminated or revoked sooner. Performed at [facility], [address], [place], [place] [address]          Radiology Studies: No results found.      Scheduled Meds: . sodium chloride   Intravenous Once  . vitamin C  500 mg Oral Daily  . B-complex with vitamin C  1 tablet Oral Daily  . chlorhexidine gluconate (MEDLINE KIT)  15 mL Mouth Rinse BID  . Chlorhexidine Gluconate Cloth  6 each Topical Daily  . cloNIDine  0.1 mg Oral BID  . dexamethasone (DECADRON) injection  6 mg Intravenous Q24H  . diazepam  5 mg Oral QID  . feeding supplement  1 Container Oral TID BM  .  feeding supplement (PRO-STAT SUGAR FREE 64)  30 mL Oral BID  . folic acid  1 mg Oral Daily  . furosemide  40 mg Intravenous Daily  . insulin aspart  0-9 Units Subcutaneous Q4H  . melatonin  10 mg Oral QHS  . pantoprazole  40 mg Intravenous Q12H  . PHENobarbital  16.2 mg Oral BID  . thiamine  100 mg Oral Daily  . zinc sulfate  220 mg Oral Daily   Continuous Infusions: . sodium chloride Stopped (04/22/20 0502)  . lactated ringers Stopped (04/22/20 1651)  . potassium PHOSPHATE IVPB (in mmol)       LOS: 6 days    Time spent: 35 minutes    Sharen Hones, MD [facility]   To contact the attending provider between 7A-7P or the covering provider during after hours 7P-7A, please log into the web site [email or web address] and access using universal [place] password for that web site. If you do not have the password, please call the hospital operator.  04/26/2020, 2:16 PM

## 2020-04-26 NOTE — Consult Note (Signed)
NAME: Kylie Monroe  DOB: [birth year]  MRN: [id number]  Date/Time: 04/26/2020 5:37 PM  REQUESTING PROVIDER: Roosevelt Locks Subjective:  REASON FOR CONSULT: bacteremia ?No history  available from patient. Chart reviewed Kylie Monroe is a 64 y.o. female with a history of Cirrhosis, HEPC ( treated) cocaine use, COPD was admitted with increasing swelling of legs on 04/20/2020. As per the EMS note they were called to her place because of shortness of breath.  But when they arrived they found her to be calm with no respiratory distress and talking full sentences.  She complained to them that she has been feeling very weak and also having swelling of her legs for the past 2 to 3 weeks.  She also said that her husband had passed away because of Covid recently and she had smoked  cocaine because of that. She also was complaining of some chest pain when she was being brought to the emergency department.   Vitals in the ED revealed a temperature of 98.1, BP of 112/66, heart rate of 103, and pulse ox of 95% on room air.  The labs revealed creatinine of 0.75, hemoglobin of 2.3, HCT of 9.8, platelet of 137.  She is told to stop that she was having black stools.  Her Covid test came positive.  Chest x-ray revealed ill-defined airspace opacity bilaterally most notably in the right upper lobe.  She was admitted to the ICU and was started on remdesivir and she was also given blood transfusion.  She was started on piperacillin and vancomycin.  As the blood culture came positive for strep viridans in 2 bottles along with coag negative staph I am asked to see the patient. Patient was also seen by GI and as she has a history of cirrhosis with splenomegaly thrombocytopenia GI bleed was suspected and she was started on octreotide.  She was also encephalopathic was noted to have DTs and in the ICU was on Precedex. She was transferred out of the ICU yesterday and in the evening her blood pressure was in the 80s and also drug paraphernalia  was noted in the patient's room. Past Medical History:  Diagnosis Date  . COPD (chronic obstructive pulmonary disease) ([place])   . Hepatitis C   . Hypertension     Past Surgical History:  Procedure Laterality Date  . CHOLECYSTECTOMY    . prolapsed rectum      Social History   Socioeconomic History  . Marital status: Divorced    Spouse name: Not on file  . Number of children: Not on file  . Years of education: Not on file  . Highest education level: Not on file  Occupational History  . Not on file  Tobacco Use  . Smoking status: Current Every Day Smoker    Packs/day: 1.00    Years: 44.00    Pack years: 44.00    Types: Cigarettes  . Smokeless tobacco: Never Used  Substance and Sexual Activity  . Alcohol use: No  . Drug use: Yes    Types: Cocaine    Comment: valium  . Sexual activity: Not on file  Other Topics Concern  . Not on file  Social History Narrative  . Not on file   Social Determinants of Health   Financial Resource Strain:   . Difficulty of Paying Living Expenses:   Food Insecurity:   . Worried About Charity fundraiser in the Last Year:   . Arboriculturist in the Last Year:   [employer]  Needs:   . Lack of Transportation (Medical):   Marland Kitchen Lack of Transportation (Non-Medical):   Physical Activity:   . Days of Exercise per Week:   . Minutes of Exercise per Session:   Stress:   . Feeling of Stress :   Social Connections:   . Frequency of Communication with Friends and Family:   . Frequency of Social Gatherings with Friends and Family:   . Attends Religious Services:   . Active Member of Clubs or Organizations:   . Attends Archivist Meetings:   Marland Kitchen Marital Status:   Intimate Partner Violence:   . Fear of Current or Ex-Partner:   . Emotionally Abused:   Marland Kitchen Physically Abused:   . Sexually Abused:     Family History  Problem Relation Age of Onset  . Cancer Mother    No Known Allergies NA ? Current Facility-Administered Medications    Medication Dose Route Frequency Provider Last Rate Last Admin  . 0.9 %  sodium chloride infusion (Manually program via Guardrails IV Fluids)   Intravenous Once Flora Lipps, MD   Stopped at 04/21/20 1605  . 0.9 %  sodium chloride infusion  250 mL Intravenous Continuous Awilda Bill, NP   Stopped at 04/22/20 0502  . acetaminophen (TYLENOL) tablet 650 mg  650 mg Oral Q6H PRN Max Sane, MD   650 mg at 04/26/20 0853  . albuterol (VENTOLIN HFA) 108 (90 Base) MCG/ACT inhaler 2 puff  2 puff Inhalation Q6H PRN Max Sane, MD      . ascorbic acid (VITAMIN C) tablet 500 mg  500 mg Oral Daily Max Sane, MD   500 mg at 04/26/20 0853  . B-complex with vitamin C tablet 1 tablet  1 tablet Oral Daily Flora Lipps, MD   1 tablet at 04/26/20 0901  . chlorhexidine gluconate (MEDLINE KIT) (PERIDEX) 0.12 % solution 15 mL  15 mL Mouth Rinse BID Max Sane, MD   15 mL at 04/24/20 0747  . Chlorhexidine Gluconate Cloth 2 % PADS 6 each  6 each Topical Daily Lang Snow, NP   6 each at 04/25/20 1107  . chlorpheniramine-HYDROcodone (TUSSIONEX) 10-8 MG/5ML suspension 5 mL  5 mL Oral Q12H PRN Max Sane, MD      . cloNIDine (CATAPRES) tablet 0.1 mg  0.1 mg Oral BID Ottie Glazier, MD   0.1 mg at 04/26/20 0853  . dexamethasone (DECADRON) injection 6 mg  6 mg Intravenous Q24H Max Sane, MD   6 mg at 04/26/20 0900  . diazepam (VALIUM) tablet 5 mg  5 mg Oral QID Flora Lipps, MD   5 mg at 04/26/20 1255  . feeding supplement (ENSURE ENLIVE) (ENSURE ENLIVE) liquid 237 mL  237 mL Oral TID BM Sharen Hones, MD      . folic acid (FOLVITE) tablet 1 mg  1 mg Oral Daily Awilda Bill, NP   1 mg at 04/26/20 3419  . furosemide (LASIX) injection 40 mg  40 mg Intravenous Daily Ottie Glazier, MD   40 mg at 04/26/20 0900  . guaiFENesin-dextromethorphan (ROBITUSSIN DM) 100-10 MG/5ML syrup 10 mL  10 mL Oral Q4H PRN Max Sane, MD      . insulin aspart (novoLOG) injection 0-9 Units  0-9 Units Subcutaneous Q4H Max Sane, MD   2 Units at 04/26/20 1255  . iron sucrose (VENOFER) 300 mg in sodium chloride 0.9 % 250 mL IVPB  300 mg Intravenous Once Sharen Hones, MD 176.7 mL/hr at 04/26/20 1722 300 mg  at 04/26/20 1722  . lactated ringers infusion   Intravenous Continuous Max Sane, MD   Stopped at 04/22/20 1651  . LORazepam (ATIVAN) injection 1 mg  1 mg Intravenous Q4H PRN Max Sane, MD   1 mg at 04/24/20 1555  . melatonin tablet 10 mg  10 mg Oral QHS Flora Lipps, MD   10 mg at 04/24/20 2203  . ondansetron (ZOFRAN) tablet 4 mg  4 mg Oral Q6H PRN Max Sane, MD       Or  . ondansetron (ZOFRAN) injection 4 mg  4 mg Intravenous Q6H PRN Max Sane, MD      . pantoprazole (PROTONIX) injection 40 mg  40 mg Intravenous Q12H Earleen Newport, MD   40 mg at 04/26/20 0900  . PHENobarbital (LUMINAL) tablet 16.2 mg  16.2 mg Oral BID Ottie Glazier, MD   16.2 mg at 04/26/20 0854  . potassium PHOSPHATE 10 mmol in dextrose 5 % 250 mL infusion  10 mmol Intravenous Once Sharen Hones, MD 42 mL/hr at 04/26/20 1721 10 mmol at 04/26/20 1721  . thiamine tablet 100 mg  100 mg Oral Daily Awilda Bill, NP   100 mg at 04/26/20 0853  . zinc sulfate capsule 220 mg  220 mg Oral Daily Max Sane, MD   220 mg at 04/26/20 0853  . ziprasidone (GEODON) injection 20 mg  20 mg Intramuscular BID PRN Awilda Bill, NP   20 mg at 04/26/20 1256     Abtx:  Anti-infectives (From admission, onward)   Start     Dose/Rate Route Frequency Ordered Stop   04/21/20 1000  remdesivir 100 mg in sodium chloride 0.9 % 100 mL IVPB     100 mg 200 mL/hr over 30 Minutes Intravenous Daily 04/20/20 1451 04/24/20 1010   04/20/20 1930  cefTRIAXone (ROCEPHIN) 2 g in sodium chloride 0.9 % 100 mL IVPB  Status:  Discontinued     2 g 200 mL/hr over 30 Minutes Intravenous Daily-1800 04/20/20 1838 04/25/20 1119   04/20/20 1600  remdesivir 200 mg in sodium chloride 0.9% 250 mL IVPB     200 mg 580 mL/hr over 30 Minutes Intravenous Once 04/20/20 1451  04/20/20 1728   04/20/20 1500  ivermectin (STROMECTOL) tablet 12,000 mcg     200 mcg/kg  63.5 kg Oral Daily 04/20/20 1445 04/25/20 0959   04/20/20 1215  vancomycin (VANCOCIN) IVPB 1000 mg/200 mL premix     1,000 mg 200 mL/hr over 60 Minutes Intravenous  Once 04/20/20 1207 04/20/20 1350   04/20/20 1215  piperacillin-tazobactam (ZOSYN) IVPB 3.375 g     3.375 g 100 mL/hr over 30 Minutes Intravenous  Once 04/20/20 1207 04/20/20 1317      REVIEW OF SYSTEMS:  NA Objective:  VITALS:  BP 122/73 (BP Location: Right Arm)   Pulse 82   Temp 98.3 F (36.8 C)   Resp 18   Ht '5\' 2"'$  (1.575 m)   Wt 72 kg   SpO2 100%   BMI 29.03 kg/m  PHYSICAL EXAM:  General: Somnolent on waking her up she opens her eyes and speaks and then goes back to sleep again  Examination is very limited as she calls up in bed again. .  Head: Normocephalic, without obvious abnormality, atraumatic.  She is pale Eyes: Did not examine Neck: Supple,  Lungs: Bilateral air entry Heart: S1-S2 Abdomen: Palpated over her gown soft, non-tender,not distended.  Extremities: Edema legs Skin: Limited examination Lymph: Cervical, supraclavicular normal. Neurologic: Cannot be assessed  Pertinent Labs Lab Results CBC    Component Value Date/Time   WBC 3.8 (L) 04/25/2020 0351   RBC 4.00 04/25/2020 0351   HGB 9.8 (L) 04/25/2020 2327   HGB 13.7 03/13/2014 1603   HCT 32.5 (L) 04/25/2020 2327   HCT 41.7 03/13/2014 1603   PLT 65 (L) 04/25/2020 0351   PLT 100 (L) 03/13/2014 1603   MCV 83.5 04/25/2020 0351   MCV 102 (H) 03/13/2014 1603   MCH 24.8 (L) 04/25/2020 0351   MCHC 29.6 (L) 04/25/2020 0351   RDW 20.9 (H) 04/25/2020 0351   RDW 15.1 (H) 03/13/2014 1603   LYMPHSABS 0.6 (L) 04/25/2020 0351   MONOABS 0.2 04/25/2020 0351   EOSABS 0.1 04/25/2020 0351   BASOSABS 0.0 04/25/2020 0351    CMP Latest Ref Rng & Units 04/26/2020 04/25/2020 04/24/2020  Glucose 70 - 99 mg/dL 81 95 90  BUN 8 - 23 mg/dL _0 Creatinine 0.44 -  1.00 mg/dL 0.70 0.72 0.95  Sodium 135 - 145 mmol/L 139 138 140  Potassium 3.5 - 5.1 mmol/L 3.3(L) 3.7 4.1  Chloride 98 - 111 mmol/L 100 106 108  CO2 22 - 32 mmol/L _1 Calcium 8.9 - 10.3 mg/dL 7.6(L) 7.3(L) 7.3(L)  Total Protein 6.5 - 8.1 g/dL - 5.4(L) 5.8(L)  Total Bilirubin 0.3 - 1.2 mg/dL - 0.6 0.6  Alkaline Phos 38 - 126 U/L - 51 51  AST 15 - 41 U/L - 19 19  ALT 0 - 44 U/L - 9 9      Microbiology: Recent Results (from the past 240 hour(s))  MRSA PCR Screening     Status: Abnormal   Collection Time: 04/20/20 11:27 AM  Result Value Ref Range Status   MRSA by PCR POSITIVE (A) NEGATIVE Final    Comment:        The GeneXpert MRSA Assay (FDA approved for NASAL specimens only), is one component of a comprehensive MRSA colonization surveillance program. It is not intended to diagnose MRSA infection nor to guide or monitor treatment for MRSA infections. CRITICAL RESULT CALLED TO, READ BACK BY AND VERIFIED WITH: MIRANBA ROBLES _2  04/20/2020 TTG Performed at [facility], [address], [place], [place] [address]   Blood Culture (routine x 2)     Status: Abnormal   Collection Time: 04/20/20 11:54 AM   Specimen: BLOOD  Result Value Ref Range Status   Specimen Description   Final    BLOOD LT [facility] Performed at [facility], [address] [place], [place] [address]    Special Requests   Final    BOTTLES DRAWN AEROBIC AND ANAEROBIC Blood Culture results may not be optimal due to an excessive volume of blood received in culture bottles Performed at [facility], [address] [place], [place] [address]    Culture  Setup Time   Final    Organism ID to follow [place] CRITICAL RESULT CALLED TO, READ BACK BY AND VERIFIED WITH: ALEX CHAPPELL ON 04/21/2020 AT 1231 TIK Performed at [facility], [address] [place], [place] [address]    Culture (A)  Final    VIRIDANS  STREPTOCOCCUS STAPHYLOCOCCUS SPECIES (COAGULASE NEGATIVE) THE SIGNIFICANCE OF ISOLATING THIS ORGANISM FROM A SINGLE SET OF BLOOD CULTURES WHEN MULTIPLE SETS ARE DRAWN IS UNCERTAIN. PLEASE NOTIFY THE MICROBIOLOGY DEPARTMENT WITHIN ONE WEEK IF SPECIATION AND SENSITIVITIES ARE REQUIRED. Performed at [facility], [place] [address],  [place], [place] [address]    Report Status 04/24/2020 FINAL  Final  Blood Culture (routine x 2)     Status: None   Collection Time: 04/20/20 11:54 AM   Specimen: BLOOD  Result Value Ref Range Status   Specimen Description BLOOD RT [facility]  Final   Special Requests   Final    BOTTLES DRAWN AEROBIC AND ANAEROBIC Blood Culture adequate volume   Culture   Final    NO GROWTH 5 DAYS Performed at [facility], [address] [place], [place] [address]    Report Status 04/25/2020 FINAL  Final  Blood Culture ID Panel (Reflexed)     Status: Abnormal   Collection Time: 04/20/20 11:54 AM  Result Value Ref Range Status   Enterococcus species NOT DETECTED NOT DETECTED Final   Listeria monocytogenes NOT DETECTED NOT DETECTED Final   Staphylococcus species NOT DETECTED NOT DETECTED Final   Staphylococcus aureus (BCID) NOT DETECTED NOT DETECTED Final   Streptococcus species DETECTED (A) NOT DETECTED Final    Comment: Not Enterococcus species, Streptococcus agalactiae, Streptococcus pyogenes, or Streptococcus pneumoniae. CRITICAL RESULT CALLED TO, READ BACK BY AND VERIFIED WITH: ALEX CHAPPELL ON 04/21/2020 AT 1231 TIK    Streptococcus agalactiae NOT DETECTED NOT DETECTED Final   Streptococcus pneumoniae NOT DETECTED NOT DETECTED Final   Streptococcus pyogenes NOT DETECTED NOT DETECTED Final   Acinetobacter baumannii NOT DETECTED NOT DETECTED Final   Enterobacteriaceae species NOT DETECTED NOT DETECTED Final   Enterobacter cloacae complex NOT DETECTED NOT DETECTED Final   Escherichia coli NOT DETECTED NOT DETECTED Final   Klebsiella oxytoca NOT DETECTED NOT  DETECTED Final   Klebsiella pneumoniae NOT DETECTED NOT DETECTED Final   Proteus species NOT DETECTED NOT DETECTED Final   Serratia marcescens NOT DETECTED NOT DETECTED Final   Haemophilus influenzae NOT DETECTED NOT DETECTED Final   Neisseria meningitidis NOT DETECTED NOT DETECTED Final   Pseudomonas aeruginosa NOT DETECTED NOT DETECTED Final   Candida albicans NOT DETECTED NOT DETECTED Final   Candida glabrata NOT DETECTED NOT DETECTED Final   Candida krusei NOT DETECTED NOT DETECTED Final   Candida parapsilosis NOT DETECTED NOT DETECTED Final   Candida tropicalis NOT DETECTED NOT DETECTED Final    Comment: Performed at [facility], [address] [place], [place] [address]  SARS Coronavirus 2 by RT PCR (hospital order, performed in [facility] hospital lab) Nasopharyngeal Nasopharyngeal Swab     Status: Abnormal   Collection Time: 04/20/20 12:57 PM   Specimen: Nasopharyngeal Swab  Result Value Ref Range Status   SARS Coronavirus 2 POSITIVE (A) NEGATIVE Final    Comment: RESULT CALLED TO, READ BACK BY AND VERIFIED WITH:  LEXIE OLIVER AT 4562 04/20/20 (NOTE) SARS-CoV-2 target nucleic acids are DETECTED SARS-CoV-2 RNA is generally detectable in upper respiratory specimens  during the acute phase of infection.  Positive results are indicative  of the presence of the identified virus, but do not rule out bacterial infection or co-infection with other pathogens not detected by the test.  Clinical correlation with patient history and  other diagnostic information is necessary to determine patient infection status.  The expected result is negative. Fact Sheet for Patients:   StrictlyIdeas.no  Fact Sheet for Healthcare Providers:   BankingDealers.co.za   This test is not yet approved or cleared by the [place] FDA and  has been authorized for detection and/or diagnosis of SARS-CoV-2 by FDA under an Emergency Use Authorization  (EUA).  This EUA will remain in effect (  meaning this test can be use d) for the duration of  the COVID-19 declaration under Section 564(b)(1) of the Act, 21 U.S.C. section 360-bbb-3(b)(1), unless the authorization is terminated or revoked sooner. Performed at [facility], [address] [place], [place] [address]     IMAGING RESULTS:  I have personally reviewed the films ? Impression/Recommendation ?Severe anemia on presentation hemoglobin of 2.8.  GI bleed suspected because of underlying cirrhosis and also black stools.  She received blood transfusion and also octreotide  COVID-19 PCR positive.  Chest x-ray shows bilateral infiltrate .  She received remdesivir and Decadron.  Strep viridans bacteremia 1 set positive this is along with coag negative staph in very likely a contaminant but because of GI bleed underlying cirrhosis and a fever this morning will continue to treat her with antibiotic.  Will DC Vanco and Zosyn but will start her on ceftriaxone.  Cocaine use.  Urine tox positive  Had DTs when she was in the ICU and was treated with Precedex Treated hepatitis C ? ?Discussed the management with the hospitalist. Note:  This document was prepared using Dragon voice recognition software and may include unintentional dictation errors.

## 2020-04-26 NOTE — Progress Notes (Signed)
Nutrition Follow-up  DOCUMENTATION CODES:   Not applicable  INTERVENTION:  Will discontinue Boost Breeze and Pro-Stat now that diet is advanced.  Provide Ensure Enlive po TID, each supplement provides 350 kcal and 20 grams of protein.  NUTRITION DIAGNOSIS:   Increased nutrient needs related to catabolic illness(Pneumonia secondary to COVID-19 virus infection) as evidenced by estimated needs.  Ongoing.  GOAL:   Patient will meet greater than or equal to 90% of their needs  Progressing.  MONITOR:   PO intake, Diet advancement, Labs, Supplement acceptance, Weight trends, I & O's, TF tolerance  REASON FOR ASSESSMENT:   Consult Assessment of nutrition requirement/status  ASSESSMENT:   64 year old female with history of COPD, PCM, hepatitis C, HTN, IV drug abuse, and recent loss of significant other on Wednesday secondary to complications form COVID, admitted for COVID pneumonia and severe anemia.  Patient's diet was advanced to regular yesterday evening. Spoke with patient over the phone this morning. She reported her appetite is much better now and she was very hungry. She reported eating 100% of her breakfast this morning. She was also willing to drink Ensure to help meet her elevated calorie/protein needs. Discussed with RN in the afternoon who confirms that patient ate well at breakfast this morning.   Medications reviewed and include: vitamin C 500 mg daily, B-complex with C daily, Decadron 6 mg Q24hrs IV, folic acid 1 mg daily, Lasix 40 mg daily, Novolog 0-9 units Q4hrs, melatonin, Protonix, thiamine 100 mg daily, zinc sulfate 220 mg daily, potassium phosphate 10 mmol IV once today.  Labs reviewed: CBG 85-188, Potassium 3.3, Phosphorus 2.3.  Diet Order:   Diet Order            Diet regular Room service appropriate? Yes; Fluid consistency: Thin  Diet effective now             EDUCATION NEEDS:   No education needs have been identified at this time  Skin:  Skin  Assessment: Reviewed RN Assessment(ecchymosis)  Last BM:  04/26/2020 - large type 6  Height:   Ht Readings from Last 1 Encounters:  04/20/20 5\' 2"  (1.575 m)   Weight:   Wt Readings from Last 1 Encounters:  04/25/20 72 kg   Ideal Body Weight:  50 kg  BMI:  Body mass index is 29.03 kg/m.  Estimated Nutritional Needs:   Kcal:  04/27/20  Protein:  95-105  Fluid:  >/= 1.8 L/day  [phone], MS, RD, LDN Pager number available on Amion

## 2020-04-27 LAB — MAGNESIUM: Magnesium: 1.8 mg/dL (ref 1.7–2.4)

## 2020-04-27 LAB — BASIC METABOLIC PANEL
Anion gap: 8 (ref 5–15)
BUN: 8 mg/dL (ref 8–23)
CO2: 31 mmol/L (ref 22–32)
Calcium: 7.8 mg/dL — ABNORMAL LOW (ref 8.9–10.3)
Chloride: 99 mmol/L (ref 98–111)
Creatinine, Ser: 0.5 mg/dL (ref 0.44–1.00)
GFR calc Af Amer: >60 mL/min (ref >60)
GFR calc non Af Amer: >60 mL/min (ref >60)
Glucose, Bld: 109 mg/dL — ABNORMAL HIGH (ref 70–99)
Potassium: 3.5 mmol/L (ref 3.5–5.1)
Sodium: 138 mmol/L (ref 135–145)

## 2020-04-27 LAB — GLUCOSE, CAPILLARY
Glucose-Capillary: 99 mg/dL (ref 70–99)
Glucose-Capillary: 99 mg/dL (ref 70–99)
Glucose-Capillary: 137 mg/dL — ABNORMAL HIGH (ref 70–99)
Glucose-Capillary: 115 mg/dL — ABNORMAL HIGH (ref 70–99)
Glucose-Capillary: 107 mg/dL — ABNORMAL HIGH (ref 70–99)
Glucose-Capillary: 171 mg/dL — ABNORMAL HIGH (ref 70–99)

## 2020-04-27 LAB — PHOSPHORUS: Phosphorus: 2 mg/dL — ABNORMAL LOW (ref 2.5–4.6)

## 2020-04-27 LAB — BRAIN NATRIURETIC PEPTIDE: B Natriuretic Peptide: 550.5 pg/mL — ABNORMAL HIGH (ref 0.0–100.0)

## 2020-04-27 MED ORDER — PANTOPRAZOLE SODIUM 40 MG PO TBEC
40.0000 mg | DELAYED_RELEASE_TABLET | Freq: Two times a day (BID) | ORAL | Status: DC
  Administered 2020-04-27 – 2020-04-28 (×3): 40 mg via ORAL
  Filled 2020-04-27 (×6): qty 1

## 2020-04-27 MED ORDER — SODIUM PHOSPHATES 45 MMOLE/15ML IV SOLN
10.0000 mmol | Freq: Once | INTRAVENOUS | Status: AC
  Administered 2020-04-27: 10 mmol via INTRAVENOUS
  Filled 2020-04-27: qty 3.33

## 2020-04-27 MED ORDER — SODIUM CHLORIDE 0.9 % IV SOLN
3.0000 g | Freq: Four times a day (QID) | INTRAVENOUS | Status: DC
  Administered 2020-04-28 – 2020-04-29 (×6): 3 g via INTRAVENOUS
  Filled 2020-04-27: qty 3
  Filled 2020-04-27: qty 8
  Filled 2020-04-27 (×2): qty 3
  Filled 2020-04-27: qty 8
  Filled 2020-04-27: qty 3
  Filled 2020-04-27: qty 8
  Filled 2020-04-27 (×2): qty 3
  Filled 2020-04-27: qty 8

## 2020-04-27 MED ORDER — LOPERAMIDE HCL 2 MG PO CAPS
4.0000 mg | ORAL_CAPSULE | Freq: Once | ORAL | Status: AC
  Administered 2020-04-27: 4 mg via ORAL
  Filled 2020-04-27: qty 2

## 2020-04-27 MED ORDER — SODIUM CHLORIDE 0.9 % IV SOLN
2.0000 g | INTRAVENOUS | Status: DC
  Filled 2020-04-27 (×2): qty 20

## 2020-04-27 MED ORDER — POTASSIUM & SODIUM PHOSPHATES 280-160-250 MG PO PACK
2.0000 | PACK | Freq: Three times a day (TID) | ORAL | Status: DC
  Filled 2020-04-27 (×2): qty 2

## 2020-04-27 MED ORDER — POTASSIUM PHOSPHATES 15 MMOLE/5ML IV SOLN
10.0000 mmol | Freq: Once | INTRAVENOUS | Status: AC
  Administered 2020-04-27: 18:00:00 10 mmol via INTRAVENOUS
  Filled 2020-04-27: qty 3.33
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
## 2020-04-27 NOTE — Progress Notes (Signed)
PROGRESS NOTE    Kylie Monroe  QRF:[id number] DOB: [birth year] DOA: 04/20/2020 PCP: Alwyn Pea, NP    Brief Narrative:  Patient is a 64 year old female with history of COPD, protein calorie malnutrition, hepatitis C, cirrhosis with splenomegaly, portal hypertension, essential hypertension and IV drug abuse who was initially admitted to the hospital on 5/14 with diagnosis of Covid pneumonia, severe anemia with hemoglobin 2.3.  Her last use of cocaine was before admission. Chest x-ray at admission showed multifocal pneumonia, more on the right side.  She is managed in ICU, she was also seen by gastroenterology, octreotide was started due to concern for upper GI bleed with portal hypertension.  EGD was deferred until patient recovered from her pneumonia. Patient will transfer out of ICU on 5/19.  Octreotide was discontinued before transfer, hemoglobin has been stable for a few days.  Her initial blood culture positive for coag negative staph in 1 out of 2 bottles.  Appears to be skin flora. Patient also had a severe acute metabolic encephalopathy, was initially treated with Precedex.  Patient was called using cocaine in her room upon transfer to medical floor on 04/25/2020.   Assessment & Plan:   Active Problems:   Pneumonia due to COVID-19 virus   Anemia   COVID-19   Melena   #1.  Acute hypoxemic respiratory failure secondary to COVID-19 pneumonia. Condition much improved.  Off oxygen.  No longer has any hypoxemia.  She has a mild elevation of BNP, but no longer has hypoxemia.  He also has significant diarrhea, I will hold off diuretics.  #2. Severe acute on chronic blood loss anemia. Hemoglobin has been stable.  She also has iron deficiency, received IV iron yesterday.  She will need outpatient EGD.  #3.  Sepsis is secondary to Covid infection. Appreciate ID consult.  Initial culture had a 1 bottle positive for coag negative staph, currently covered with Augmentin.  Repeat  cultures so far has no growth.  No additional fever today.  Reviewed chest x-ray, still has some interstitial lung infiltrates consistent with viral pneumonia.  #4.  Polysubstance abuse with severe agitation. Patient has been agitated, she was urinating on the floor.  She was given Geodon again.  I spoke with psychiatry, patient will be reevaluated.  #5.  Moderate protein calorie malnutrition. Supplement per  6.  Diarrhea. Patient had stool softener before, discontinued yesterday.  Still has significant loose stools.  Will give a dose of Imodium.  7.  Hypokalemia and hypophosphatemia. Due to patient diarrhea, I will supplement via IV.   DVT prophylaxis: SCDs Code Status: Full Family Communication:  None  Disposition Plan:   Patient came from:Home                                                                                                                Anticipated d/c place: May need SNF  Barriers to d/c OR conditions which need to be met to effect a safe d/c:   Consultants:   ICU  Procedures: None Antimicrobials:  Unasyn.     Subjective: Patient has been very agitated again today, she was yelling at the nurse, she was urinating on the floor.  I have discussed with psychiatry, she will be seen again. Patient also has significant loose stools today, but not watery.  No abdominal pain. She no longer has any fever for last 24 hours.  He denies any short of breath or cough. She does not have any dysuria hematuria.  Objective: Vitals:   04/26/20 1708 04/26/20 2000 04/27/20 0000 04/27/20 0400  BP: 122/73 140/61 (!) 102/53 (!) 110/54  Pulse: 82   78  Resp: _0 Temp: 98.3 F (36.8 C)  97.8 F (36.6 C) 99 F (37.2 C)  TempSrc:   Oral Oral  SpO2: 100%   95%  Weight:      Height:       No intake or output data in the 24 hours ending 04/27/20 1556 Filed Weights   04/20/20 1045 04/24/20 0500 04/25/20 0500  Weight: 63.5 kg 73.6 kg 72 kg     Examination:  General exam: Appears calm and comfortable  Respiratory system: Clear to auscultation. Respiratory effort normal. Cardiovascular system: S1 & S2 heard, RRR. No JVD, murmurs, rubs, gallops or clicks. No pedal edema. Gastrointestinal system: Abdomen is nondistended, soft and nontender. No organomegaly or masses felt. Normal bowel sounds heard. Central nervous system: Drowsy and oriented x2. No focal neurological deficits. Extremities: Symmetric  Skin: No rashes, lesions or ulcers Psychiatry: dowsy     Data Reviewed: I have personally reviewed following labs and imaging studies  CBC: Recent Labs  Lab 04/21/20 0348 04/21/20 1118 04/22/20 0321 04/22/20 0726 04/23/20 2041 04/23/20 2041 04/24/20 0358 04/24/20 0819 04/25/20 0351 04/25/20 0351 04/25/20 0811 04/25/20 1115 04/25/20 1632 04/25/20 2032 04/25/20 2327  WBC 2.7*  --  5.2  --  3.4*  --  4.4  --  3.8*  --   --   --   --   --   --   NEUTROABS 2.3  --  4.3  --  2.8  --  3.5  --  2.9  --   --   --   --   --   --   HGB 6.5*   < > 8.5*   < > 10.1*   < > 10.7*   < > 9.9*   < > 10.1* 10.2* 10.9* 10.7* 9.8*  HCT 22.3*   < > 27.0*   < > 34.0*   < > 35.4*   < > 33.4*   < > 34.2* 34.6* 35.3* 36.5 32.5*  MCV 79.9*  --  78.5*  --  83.5  --  82.3  --  83.5  --   --   --   --   --   --   PLT 91*  --  103*  --  68*  --  74*  --  65*  --   --   --   --   --   --    < > = values in this interval not displayed.   Basic Metabolic Panel: Recent Labs  Lab 04/23/20 0443 04/24/20 0358 04/25/20 0351 04/26/20 0756 04/27/20 1249  NA 141 140 138 139 138  K 4.1 4.1 3.7 3.3* 3.5  CL 112* 108 106 100 99  CO2 _1 GLUCOSE 99 90 95 81 109*  BUN 26* _2 CREATININE 1.01* 0.95 0.72 0.70 0.50  CALCIUM 7.5* 7.3* 7.3* 7.6* 7.8*  MG 2.0 1.9 1.8 1.8 1.8  PHOS 3.6 3.9 3.1 2.3* 2.0*   GFR: Estimated Creatinine Clearance: 67.8 mL/min (by C-G formula based on SCr of 0.5 mg/dL). Liver Function Tests: Recent Labs   Lab 04/21/20 0348 04/22/20 0321 04/23/20 0443 04/24/20 0358 04/25/20 0351  AST _0 ALT _1 ALKPHOS 53 51 49 51 51  BILITOT 2.0* 0.8 0.7 0.6 0.6  PROT 6.3* 6.1* 5.8* 5.8* 5.4*  ALBUMIN 2.5* 2.5* 2.3* 2.3* 2.3*   No results for input(s): LIPASE, AMYLASE in the last 168 hours. Recent Labs  Lab 04/23/20 1448  AMMONIA 28   Coagulation Profile: No results for input(s): INR, PROTIME in the last 168 hours. Cardiac Enzymes: No results for input(s): CKTOTAL, CKMB, CKMBINDEX, TROPONINI in the last 168 hours. BNP (last 3 results) No results for input(s): PROBNP in the last 8760 hours. HbA1C: No results for input(s): HGBA1C in the last 72 hours. CBG: Recent Labs  Lab 04/26/20 1943 04/26/20 2247 04/27/20 0353 04/27/20 0825 04/27/20 1141  GLUCAP 99 147* 99 137* 115*   Lipid Profile: No results for input(s): CHOL, HDL, LDLCALC, TRIG, CHOLHDL, LDLDIRECT in the last 72 hours. Thyroid Function Tests: No results for input(s): TSH, T4TOTAL, FREET4, T3FREE, THYROIDAB in the last 72 hours. Anemia Panel: Recent Labs    04/25/20 0351  FERRITIN 11   Sepsis Labs: Recent Labs  Lab 04/20/20 1824  PROCALCITON <0.10  LATICACIDVEN 1.4    Recent Results (from the past 240 hour(s))  MRSA PCR Screening     Status: Abnormal   Collection Time: 04/20/20 11:27 AM  Result Value Ref Range Status   MRSA by PCR POSITIVE (A) NEGATIVE Final    Comment:        The GeneXpert MRSA Assay (FDA approved for NASAL specimens only), is one component of a comprehensive MRSA colonization surveillance program. It is not intended to diagnose MRSA infection nor to guide or monitor treatment for MRSA infections. CRITICAL RESULT CALLED TO, READ BACK BY AND VERIFIED WITH: MIRANBA ROBLES _2  04/20/2020 TTG Performed at [facility], [address], [place], [place] [address]   Blood Culture (routine x 2)     Status: Abnormal   Collection Time: 04/20/20 11:54 AM    Specimen: BLOOD  Result Value Ref Range Status   Specimen Description   Final    BLOOD LT [facility] Performed at [facility], [address] [place], [place] [address]    Special Requests   Final    BOTTLES DRAWN AEROBIC AND ANAEROBIC Blood Culture results may not be optimal due to an excessive volume of blood received in culture bottles Performed at [facility], [address] [place], [place] [address]    Culture  Setup Time   Final    Organism ID to follow [place] CRITICAL RESULT CALLED TO, READ BACK BY AND VERIFIED WITH: ALEX CHAPPELL ON 04/21/2020 AT 1231 TIK Performed at [facility], [address] [place], [place] [address]    Culture (A)  Final    VIRIDANS STREPTOCOCCUS STAPHYLOCOCCUS SPECIES (COAGULASE NEGATIVE) THE SIGNIFICANCE OF ISOLATING THIS ORGANISM FROM A SINGLE SET OF BLOOD CULTURES WHEN MULTIPLE SETS ARE DRAWN IS UNCERTAIN. PLEASE NOTIFY THE MICROBIOLOGY DEPARTMENT WITHIN ONE WEEK IF SPECIATION AND SENSITIVITIES ARE REQUIRED. Performed at [facility], [place] [address] [place], [place] [address]  Report Status 04/24/2020 FINAL  Final  Blood Culture (routine x 2)     Status: None   Collection Time: 04/20/20 11:54 AM   Specimen: BLOOD  Result Value Ref Range Status   Specimen Description BLOOD RT [facility]  Final   Special Requests   Final    BOTTLES DRAWN AEROBIC AND ANAEROBIC Blood Culture adequate volume   Culture   Final    NO GROWTH 5 DAYS Performed at [facility], [address] [place], [place] [address]    Report Status 04/25/2020 FINAL  Final  Blood Culture ID Panel (Reflexed)     Status: Abnormal   Collection Time: 04/20/20 11:54 AM  Result Value Ref Range Status   Enterococcus species NOT DETECTED NOT DETECTED Final   Listeria monocytogenes NOT DETECTED NOT DETECTED Final   Staphylococcus species NOT DETECTED NOT DETECTED Final   Staphylococcus aureus (BCID)  NOT DETECTED NOT DETECTED Final   Streptococcus species DETECTED (A) NOT DETECTED Final    Comment: Not Enterococcus species, Streptococcus agalactiae, Streptococcus pyogenes, or Streptococcus pneumoniae. CRITICAL RESULT CALLED TO, READ BACK BY AND VERIFIED WITH: ALEX CHAPPELL ON 04/21/2020 AT 1231 TIK    Streptococcus agalactiae NOT DETECTED NOT DETECTED Final   Streptococcus pneumoniae NOT DETECTED NOT DETECTED Final   Streptococcus pyogenes NOT DETECTED NOT DETECTED Final   Acinetobacter baumannii NOT DETECTED NOT DETECTED Final   Enterobacteriaceae species NOT DETECTED NOT DETECTED Final   Enterobacter cloacae complex NOT DETECTED NOT DETECTED Final   Escherichia coli NOT DETECTED NOT DETECTED Final   Klebsiella oxytoca NOT DETECTED NOT DETECTED Final   Klebsiella pneumoniae NOT DETECTED NOT DETECTED Final   Proteus species NOT DETECTED NOT DETECTED Final   Serratia marcescens NOT DETECTED NOT DETECTED Final   Haemophilus influenzae NOT DETECTED NOT DETECTED Final   Neisseria meningitidis NOT DETECTED NOT DETECTED Final   Pseudomonas aeruginosa NOT DETECTED NOT DETECTED Final   Candida albicans NOT DETECTED NOT DETECTED Final   Candida glabrata NOT DETECTED NOT DETECTED Final   Candida krusei NOT DETECTED NOT DETECTED Final   Candida parapsilosis NOT DETECTED NOT DETECTED Final   Candida tropicalis NOT DETECTED NOT DETECTED Final    Comment: Performed at [facility], [address] [place], [place] [address]  SARS Coronavirus 2 by RT PCR (hospital order, performed in [place] hospital lab) Nasopharyngeal Nasopharyngeal Swab     Status: Abnormal   Collection Time: 04/20/20 12:57 PM   Specimen: Nasopharyngeal Swab  Result Value Ref Range Status   SARS Coronavirus 2 POSITIVE (A) NEGATIVE Final    Comment: RESULT CALLED TO, READ BACK BY AND VERIFIED WITH:  LEXIE OLIVER AT 0981 04/20/20 (NOTE) SARS-CoV-2 target nucleic acids are DETECTED SARS-CoV-2 RNA is generally  detectable in upper respiratory specimens  during the acute phase of infection.  Positive results are indicative  of the presence of the identified virus, but do not rule out bacterial infection or co-infection with other pathogens not detected by the test.  Clinical correlation with patient history and  other diagnostic information is necessary to determine patient infection status.  The expected result is negative. Fact Sheet for Patients:   StrictlyIdeas.no  Fact Sheet for Healthcare Providers:   BankingDealers.co.za   This test is not yet approved or cleared by the [place] FDA and  has been authorized for detection and/or diagnosis of SARS-CoV-2 by FDA under an Emergency Use Authorization (EUA).  This EUA will remain in effect (meaning this test can be use  d) for the duration of  the COVID-19 declaration under Section 564(b)(1) of the Act, 21 U.S.C. section 360-bbb-3(b)(1), unless the authorization is terminated or revoked sooner. Performed at [facility], [address] [place], [place] [address]   CULTURE, BLOOD (ROUTINE X 2) w Reflex to ID Panel     Status: None (Preliminary result)   Collection Time: 04/26/20  4:58 PM   Specimen: BLOOD  Result Value Ref Range Status   Specimen Description BLOOD BLOOD RIGHT HAND  Final   Special Requests   Final    BOTTLES DRAWN AEROBIC AND ANAEROBIC Blood Culture results may not be optimal due to an inadequate volume of blood received in culture bottles   Culture   Final    NO GROWTH < 24 HOURS Performed at [facility], [address] [place], [place] [address]    Report Status PENDING  Incomplete  CULTURE, BLOOD (ROUTINE X 2) w Reflex to ID Panel     Status: None (Preliminary result)   Collection Time: 04/26/20  4:58 PM   Specimen: BLOOD  Result Value Ref Range Status   Specimen Description BLOOD BLOOD LEFT HAND  Final   Special Requests   Final    BOTTLES  DRAWN AEROBIC AND ANAEROBIC Blood Culture results may not be optimal due to an inadequate volume of blood received in culture bottles   Culture   Final    NO GROWTH < 24 HOURS Performed at [facility], [address], [place], [place] [address]    Report Status PENDING  Incomplete         Radiology Studies: DG Chest 2 View  Result Date: 04/26/2020 CLINICAL DATA:  COVID-19 positive.  Hypertension. EXAM: CHEST - 2 VIEW COMPARISON:  Apr 24, 2020 FINDINGS: There is interstitial thickening and areas of patchy airspace opacity bilaterally with patchy airspace opacity primarily in the lung bases. There appears to be resolution of previous small right pleural effusion. No new opacities are evident. Heart size is upper normal with pulmonary vascularity normal. No adenopathy. Bones are osteoporotic. Old healed fracture lateral left clavicle noted. IMPRESSION: Ill-defined interstitial thickening and patchy airspace opacity, likely multifocal pneumonia. Suspect atypical organism pneumonia given clinical history. Compared to 2 days prior, no new opacity evident. Slight clearing right upper lobe and apparent resolution of small right pleural effusion. Heart upper normal in size. No adenopathy evident. Bones osteoporotic. Electronically Signed   By: Lowella Grip III M.D.   On: 04/26/2020 15:58        Scheduled Meds: . sodium chloride   Intravenous Once  . vitamin C  500 mg Oral Daily  . B-complex with vitamin C  1 tablet Oral Daily  . chlorhexidine gluconate (MEDLINE KIT)  15 mL Mouth Rinse BID  . Chlorhexidine Gluconate Cloth  6 each Topical Daily  . cloNIDine  0.1 mg Oral BID  . dexamethasone (DECADRON) injection  6 mg Intravenous Q24H  . diazepam  5 mg Oral QID  . feeding supplement (ENSURE ENLIVE)  237 mL Oral TID BM  . folic acid  1 mg Oral Daily  . furosemide  40 mg Intravenous Daily  . insulin aspart  0-9 Units Subcutaneous Q4H  . loperamide  4 mg Oral Once  . melatonin   10 mg Oral QHS  . pantoprazole  40 mg Oral BID  . PHENobarbital  16.2 mg Oral BID  . thiamine  100 mg Oral Daily  . zinc sulfate  220 mg Oral Daily  Continuous Infusions: . sodium chloride Stopped (04/22/20 0502)  . ampicillin-sulbactam (UNASYN) IV    . lactated ringers Stopped (04/22/20 1651)  . potassium PHOSPHATE IVPB (in mmol)    . sodium phosphate  Dextrose 5% IVPB       LOS: 7 days    Time spent: 35 minutes    Sharen Hones, MD [facility]   To contact the attending provider between 7A-7P or the covering provider during after hours 7P-7A, please log into the web site [email or web address] and access using universal [place] password for that web site. If you do not have the password, please call the hospital operator.  04/27/2020, 3:56 PM

## 2020-04-27 NOTE — Evaluation (Addendum)
Occupational Therapy Evaluation Patient Details Name: Kylie Monroe MRN: [id number] DOB: 10-03-1956 Today's Date: 04/27/2020    History of Present Illness [name] is a 26yoF who comes to [facility] on 5/14 c facial swelling, CP, LEE. Pt admitted with COVID-19 PNA, also had ABLA c Hb: 2.3 upon arrival Pt's significant other is recently deceased on 5/10 2/2 complications related to [place]. PMH: COPD, malnutrition, HepC, HTN, IV drug abuse, crack/cocaine use.    Clinical Impression   [name] was seen for OT evaluation this date. Prior to hospital admission, pt was Independent c mobility and required supervision only for bathing. Pt reports she has recently experienced loss of partner and was emotional t/o session. Pt oriented to self only, pt able to state location as hospital but is adamant located in [place]. Pt lives alone, reports that her daughter will stay with her 24/7 upon d/c however per [facility] daughter has stated she/family will be unable to provide 24/7 supervision. Pt presents to acute OT demonstrating impaired ADL performance, functional cognition, and functional mobility 2/2 decreased safety awareness, functional strength/balance deficits, and decreased activity tolerance. Pt currently requires SBA seated grooming/dressing tasks. CGA hand washing standing sink side + VCs for sequencing. SBA toileting at [facility] c CGA for perihygiene in standing - MIN A for thoroughness in perihygiene. Of note, pt engages in unsafe standing balance for perihygiene at [facility] (forward trunk flexion, 90* hip flexion, c legs full extension and head below level of hips) but maintains balance c CGA during perihygiene. Pt would benefit from skilled OT to address noted impairments and functional limitations (see below for any additional details) in order to maximize safety and independence while minimizing falls risk and caregiver burden. Upon hospital discharge, recommend [place] c 24/7 Supervision to maximize pt safety and  return to functional independence during meaningful occupations of daily life. Pt is unsafe to d/c home if 24/7 supervision is not available.      Follow Up Recommendations  Home health OT;Supervision/Assistance - 24 hour    Equipment Recommendations       Recommendations for Other Services       Precautions / Restrictions Precautions Precautions: Fall Restrictions Weight Bearing Restrictions: No      Mobility Bed Mobility Overal bed mobility: Needs Assistance Bed Mobility: Supine to Sit;Sit to Supine;Rolling Rolling: Supervision   Supine to sit: Supervision Sit to supine: Supervision   General bed mobility comments: SUPERVISION + VCs for initiation - pt moves slowly but completes sup<>sit and rolling c no physical assist   Transfers Overall transfer level: Needs assistance Equipment used: 1 person hand held assist Transfers: Sit to/from Stand;Stand Pivot Transfers Sit to Stand: Min guard Stand pivot transfers: Min guard       General transfer comment: CGA + 1 HHA sit<>stand and SPT bed<>BSC c VCs for safety     Balance Overall balance assessment: Needs assistance Sitting-balance support: No upper extremity supported;Feet supported Sitting balance-Leahy Scale: Fair Sitting balance - Comments: intermittent L and R postural sway - weak core vs self-comfort while sharing extensive recent losses in family    Standing balance support: No upper extremity supported;During functional activity Standing balance-Leahy Scale: Fair Standing balance comment: Pt engages in unsafe standing balance for toileting ( forward trunk flexion, 90* hip flexion, c legs full extension and head below level of hips) but maintains balance c CGA  ADL either performed or assessed with clinical judgement   ADL Overall ADL's : Needs assistance/impaired                                       General ADL Comments: SBA seated grooming tasks.  SETUP + SBA UBD at bed level and seated EOB. CGA don/doff socks seated EOB. SBA toileting at [facility] c CGA for perihygiene in standing - MIN A for thoroughness in perihygiene. CGA hand washing standing sink side + VCs for sequencing.      Vision         Perception     Praxis      Pertinent Vitals/Pain Pain Assessment: No/denies pain     Hand Dominance     Extremity/Trunk Assessment Upper Extremity Assessment Upper Extremity Assessment: Generalized weakness   Lower Extremity Assessment Lower Extremity Assessment: Generalized weakness       Communication Communication Communication: No difficulties   Cognition Arousal/Alertness: Awake/alert Behavior During Therapy: Anxious(Pt emotional r/t bereavement) Overall Cognitive Status: Impaired/Different from baseline Area of Impairment: Orientation;Memory;Following commands;Safety/judgement                 Orientation Level: Disoriented to;Place;Time;Situation(Pt reports location as [facility] and date as May 18th after 1pm, easily redirected to appropriate date and time. Unable to state why she is in hospital.)   Memory: Decreased short-term memory Following Commands: Follows one step commands consistently;Follows one step commands with increased time Safety/Judgement: Decreased awareness of safety;Decreased awareness of deficits         General Comments  HR 112 during standing ADLs, 104 seated ADLs     Exercises Exercises: Other exercises Other Exercises Other Exercises: Pt educated re: OT role, falls prevention, importance of supervision for OOB mobility, energy conservation, home/routines modification Other Exercises: Toileting x2, UBD, LBD, hand washing, hair brushing, oral hygiene, bed mobility L+R rolling, sup<>sit, sit<>stand x2, SPT, sitting/standing balance/tolerance, therapeutic listening   Shoulder Instructions      Home Living Family/patient expects to be discharged to:: Private  residence Living Arrangements: Alone Available Help at Discharge: Family(Pt states DTR available 24/7 at d/c - per [facility] DTR cannot ) Type of Home: House Home Access: Ramped entrance     Home Layout: One level     Bathroom Shower/Tub: Tub/shower unit         Home Equipment: Bedside commode;Shower seat;Hospital bed(Per pt report - unclear if reliable historian )          Prior Functioning/Environment Level of Independence: Needs assistance    ADL's / Homemaking Assistance Needed: Pt reports supervision for bathing and assisst for IADLs. Pt reports O2 at baseline            OT Problem List: Decreased strength;Decreased activity tolerance;Impaired balance (sitting and/or standing);Decreased cognition;Decreased safety awareness;Decreased knowledge of use of DME or AE      OT Treatment/Interventions: Self-care/ADL training;Therapeutic exercise;Energy conservation;DME and/or AE instruction;Therapeutic activities;Cognitive remediation/compensation;Balance training;Patient/family education    OT Goals(Current goals can be found in the care plan section) Acute Rehab OT Goals Patient Stated Goal: To return home OT Goal Formulation: With patient Time For Goal Achievement: 05/11/20 Potential to Achieve Goals: Good ADL Goals Pt Will Perform Lower Body Dressing: with set-up;with supervision;sit to/from stand(c LRAD PRN) Pt Will Transfer to Toilet: with supervision;stand pivot transfer;bedside commode(c LRAD PRN) Pt Will Perform Toileting - Clothing Manipulation and hygiene: with supervision;sitting/lateral leans(c LRAD PRN) Additional  ADL Goal #1: Pt will independenlty verbalize plan to implement x3 falls prevention.  OT Frequency: Min 1X/week   Barriers to D/C: Decreased caregiver support;Inaccessible home environment          Co-evaluation              AM-PAC OT "6 Clicks" Daily Activity     Outcome Measure Help from another person eating meals?: None Help from another  person taking care of personal grooming?: None Help from another person toileting, which includes using toliet, bedpan, or urinal?: A Little Help from another person bathing (including washing, rinsing, drying)?: A Little Help from another person to put on and taking off regular upper body clothing?: None Help from another person to put on and taking off regular lower body clothing?: A Little 6 Click Score: 21   End of Session Equipment Utilized During Treatment: Oxygen(2L O2)  Activity Tolerance: Patient tolerated treatment well Patient left: in bed;with call bell/phone within reach;with bed alarm set  OT Visit Diagnosis: Muscle weakness (generalized) (M62.81);Other abnormalities of gait and mobility (R26.89)                Time: 6160-7371 OT Time Calculation (min): 54 min Charges:  OT General Charges $OT Visit: 1 Visit OT Evaluation $OT Eval Moderate Complexity: 1 Mod OT Treatments $Self Care/Home Management : 38-52 mins  Kathie Dike, M.S. OTR/L  04/27/20, 12:10 PM

## 2020-04-27 NOTE — Progress Notes (Signed)
PT Cancellation Note  Patient Details Name: MARYURI WARNKE MRN: [id number] DOB: December 01, 1956   Cancelled Treatment:    Reason Eval/Treat Not Completed: Patient's level of consciousness(Per chart review, pt agitated and combative. Will attempt PT evaluation again at later date/time.)  3:58 PM, 04/27/20 Rosamaria Lints, PT, DPT Physical Therapist - [facility]  [phone] (ASCOM)    Promiss Labarbera C 04/27/2020, 3:58 PM

## 2020-04-28 LAB — CBC WITH DIFFERENTIAL/PLATELET
Abs Immature Granulocytes: 0.02 10*3/uL (ref 0.00–0.07)
Basophils Absolute: 0 10*3/uL (ref 0.0–0.1)
Basophils Relative: 0 %
Eosinophils Absolute: 0.1 10*3/uL (ref 0.0–0.5)
Eosinophils Relative: 2 %
HCT: 29.4 % — ABNORMAL LOW (ref 36.0–46.0)
Hemoglobin: 8.5 g/dL — ABNORMAL LOW (ref 12.0–15.0)
Immature Granulocytes: 1 %
Lymphocytes Relative: 11 %
Lymphs Abs: 0.4 10*3/uL — ABNORMAL LOW (ref 0.7–4.0)
MCH: 24.5 pg — ABNORMAL LOW (ref 26.0–34.0)
MCHC: 28.9 g/dL — ABNORMAL LOW (ref 30.0–36.0)
MCV: 84.7 fL (ref 80.0–100.0)
Monocytes Absolute: 0.3 10*3/uL (ref 0.1–1.0)
Monocytes Relative: 9 %
Neutro Abs: 3 10*3/uL (ref 1.7–7.7)
Neutrophils Relative %: 77 %
Platelets: 51 10*3/uL — ABNORMAL LOW (ref 150–400)
RBC: 3.47 MIL/uL — ABNORMAL LOW (ref 3.87–5.11)
RDW: 21.4 % — ABNORMAL HIGH (ref 11.5–15.5)
Smear Review: NORMAL
WBC: 3.8 10*3/uL — ABNORMAL LOW (ref 4.0–10.5)
nRBC: 0 % (ref 0.0–0.2)

## 2020-04-28 LAB — BASIC METABOLIC PANEL
Anion gap: 8 (ref 5–15)
BUN: 11 mg/dL (ref 8–23)
CO2: 31 mmol/L (ref 22–32)
Calcium: 7.9 mg/dL — ABNORMAL LOW (ref 8.9–10.3)
Chloride: 99 mmol/L (ref 98–111)
Creatinine, Ser: 0.46 mg/dL (ref 0.44–1.00)
GFR calc Af Amer: >60 mL/min (ref >60)
GFR calc non Af Amer: >60 mL/min (ref >60)
Glucose, Bld: 76 mg/dL (ref 70–99)
Potassium: 3.7 mmol/L (ref 3.5–5.1)
Sodium: 138 mmol/L (ref 135–145)

## 2020-04-28 LAB — PHOSPHORUS: Phosphorus: 2.1 mg/dL — ABNORMAL LOW (ref 2.5–4.6)

## 2020-04-28 LAB — GLUCOSE, CAPILLARY
Glucose-Capillary: 103 mg/dL — ABNORMAL HIGH (ref 70–99)
Glucose-Capillary: 106 mg/dL — ABNORMAL HIGH (ref 70–99)
Glucose-Capillary: 113 mg/dL — ABNORMAL HIGH (ref 70–99)
Glucose-Capillary: 154 mg/dL — ABNORMAL HIGH (ref 70–99)
Glucose-Capillary: 157 mg/dL — ABNORMAL HIGH (ref 70–99)
Glucose-Capillary: 160 mg/dL — ABNORMAL HIGH (ref 70–99)
Glucose-Capillary: 250 mg/dL — ABNORMAL HIGH (ref 70–99)
Glucose-Capillary: 67 mg/dL — ABNORMAL LOW (ref 70–99)
Glucose-Capillary: 68 mg/dL — ABNORMAL LOW (ref 70–99)

## 2020-04-28 LAB — MAGNESIUM: Magnesium: 1.8 mg/dL (ref 1.7–2.4)

## 2020-04-28 MED ORDER — SODIUM PHOSPHATES 45 MMOLE/15ML IV SOLN
20.0000 mmol | Freq: Once | INTRAVENOUS | Status: AC
  Administered 2020-04-28: 20 mmol via INTRAVENOUS
  Filled 2020-04-28: qty 6.67

## 2020-04-28 NOTE — Evaluation (Signed)
Physical Therapy Evaluation Patient Details Name: Kylie Monroe MRN: [id number] DOB: [birth year] Today's Date: 04/28/2020   History of Present Illness  Kylie Monroe is a 24yoF who comes to [facility] on 5/14 c facial swelling, CP, LEE. Pt admitted with COVID-19 PNA, also had ABLA c Hb: 2.3 upon arrival Pt's significant other is recently deceased on 4/40 2/2 complications related to [place]. PMH: COPD, malnutrition, HepC, HTN, IV drug abuse, crack/cocaine use.   Clinical Impression  On arrival pt leaning out of bed to retrieve a pen, with completely soiled gown, pull-up, and bed sheets, which she reports she has been sitting in "for four hours" asking for nursing to assist her. CNA assisted PT in changing gown where patient is modI for rolling. Pt is modI for STS, though she requires heavy cuing for RW set up and hand placement. Once standing, patient has difficulty turning with RW, and insists on letting go of RW, making her very unsteady, requiring minA to maintain balance. Once patient is able to comply with RW management she is able to obtain balance and ambulate 18ft in room, though she does need minA x1 instance where she is unable to comply with cuing for RW management. PT recommendation is SNF d/t decreased safety awareness, high fall risk, and quick fatigue with minimal exertion. Would benefit from skilled PT to address above deficits and promote optimal return to PLOF.     Follow Up Recommendations SNF    Equipment Recommendations  Rolling walker with 5" wheels    Recommendations for Other Services       Precautions / Restrictions Precautions Precautions: Fall Restrictions Weight Bearing Restrictions: No      Mobility  Bed Mobility Overal bed mobility: Needs Assistance Bed Mobility: Supine to Sit;Sit to Supine;Rolling Rolling: Supervision   Supine to sit: Supervision Sit to supine: Supervision   General bed mobility comments: supervisiona nd VC needed for initation of  transfer, pt able to complete with increased time  Transfers Overall transfer level: Needs assistance Equipment used: Rolling walker (2 wheeled) Transfers: Sit to/from Stand Sit to Stand: Min guard         General transfer comment: Heavy cuing for set up of transfer with RW with good carry over  Ambulation/Gait Ambulation/Gait assistance: Min guard Gait Distance (Feet): 20 Feet Assistive device: Rolling walker (2 wheeled) Gait Pattern/deviations: WFL(Within Functional Limits) Gait velocity: increased   General Gait Details: consistent cuing to "stay inside walker" and to decrease speed to allow for line management  Stairs            Wheelchair Mobility    Modified Rankin (Stroke Patients Only)       Balance Overall balance assessment: Needs assistance Sitting-balance support: No upper extremity supported;Feet supported Sitting balance-Leahy Scale: Fair Sitting balance - Comments: intermittent L and R postural sway - weak core vs self-comfort while sharing extensive recent losses in family    Standing balance support: During functional activity Standing balance-Leahy Scale: Fair Standing balance comment: Patient with increased sway when she lets go of RW, staggering, cuing to maintain UE support on RW                             Pertinent Vitals/Pain Pain Assessment: No/denies pain    Home Living Family/patient expects to be discharged to:: Private residence   Available Help at Discharge: Family Type of Home: House Home Access: Ramped entrance     Home Layout: One level  Home Equipment: Bedside commode;Shower seat;Hospital bed      Prior Function Level of Independence: Needs assistance      ADL's / Homemaking Assistance Needed: Pt reports supervision for bathing and assisst for IADLs. Pt reports O2 at baseline        Hand Dominance        Extremity/Trunk Assessment   Upper Extremity Assessment Upper Extremity Assessment:  Generalized weakness    Lower Extremity Assessment Lower Extremity Assessment: Generalized weakness       Communication   Communication: No difficulties  Cognition Arousal/Alertness: Awake/alert Behavior During Therapy: Anxious Overall Cognitive Status: Impaired/Different from baseline Area of Impairment: Orientation;Memory;Following commands;Safety/judgement                 Orientation Level: Disoriented to;Place;Time;Situation   Memory: Decreased short-term memory Following Commands: Follows one step commands consistently;Follows one step commands with increased time Safety/Judgement: Decreased awareness of safety;Decreased awareness of deficits            General Comments      Exercises Other Exercises Other Exercises: Supine > sit heavy cuing needed for intiation of transfer, patient able to complete modI Other Exercises: STS heavy cuing for intiation of transfer, hand placement RW management, able to complete modI Other Exercises: Once standing patient attempts turn without RW, very unsteady and staggering, needs minA to maintain balance. Education on RW use and need for this for safety with good compliance Other Exercises: ambulation 72ft with heavy cuing for RW management, minA occassionally when patient attempts turn around without RW, decreased knowledge of safety limitations   Assessment/Plan    PT Assessment Patient needs continued PT services  PT Problem List Decreased strength;Decreased mobility;Decreased range of motion;Decreased activity tolerance;Decreased balance;Decreased coordination       PT Treatment Interventions DME instruction;Therapeutic activities;Gait training;Therapeutic exercise;Patient/family education;Functional mobility training;Neuromuscular re-education;Manual techniques;Balance training    PT Goals (Current goals can be found in the Care Plan section)  Acute Rehab PT Goals Patient Stated Goal: To return home PT Goal Formulation:  With patient Time For Goal Achievement: 05/12/20 Potential to Achieve Goals: Fair    Frequency Min 2X/week   Barriers to discharge Decreased caregiver support      Co-evaluation               AM-PAC PT "6 Clicks" Mobility  Outcome Measure Help needed turning from your back to your side while in a flat bed without using bedrails?: A Little Help needed moving from lying on your back to sitting on the side of a flat bed without using bedrails?: A Little Help needed moving to and from a bed to a chair (including a wheelchair)?: A Little Help needed standing up from a chair using your arms (e.g., wheelchair or bedside chair)?: A Little Help needed to walk in hospital room?: A Lot Help needed climbing 3-5 steps with a railing? : Total 6 Click Score: 15    End of Session Equipment Utilized During Treatment: Gait belt Activity Tolerance: Patient tolerated treatment well Patient left: in bed;with call bell/phone within reach;with bed alarm set;with nursing/sitter in room Nurse Communication: Mobility status PT Visit Diagnosis: Unsteadiness on feet (R26.81);Other abnormalities of gait and mobility (R26.89);Difficulty in walking, not elsewhere classified (R26.2);Muscle weakness (generalized) (M62.81)    Time: 0300-0330 PT Time Calculation (min) (ACUTE ONLY): 30 min   Charges:   PT Evaluation $PT Eval Moderate Complexity: 1 Mod PT Treatments $Therapeutic Activity: 23-37 mins       Staci Acosta PT, DPT  [address]  [place] 04/28/2020, 3:48 PM

## 2020-04-28 NOTE — Progress Notes (Signed)
PROGRESS NOTE    Kylie Monroe  BMS:[id number] DOB: [birth year] DOA: 04/20/2020 PCP: Alwyn Pea, NP   Brief Narrative:  Patient is a 64 year old female with history of COPD, protein calorie malnutrition, hepatitis C, cirrhosis with splenomegaly,portal hypertension, essential hypertension and IV drug abuse who was initially admitted to the hospital on 5/14 with diagnosis of Covid pneumonia,severe anemia with hemoglobin 2.3. Her last use of cocaine was before admission. Chest x-ray at admission showed multifocal pneumonia, more on the right side. She is managed in ICU, she was also seen by gastroenterology, octreotide was started due to concern for upper GI bleed with portal hypertension. EGD was deferred until patient recovered from her pneumonia. Patient will transfer out of ICU on 5/19. Octreotide was discontinued before transfer, hemoglobin has been stable for a few days. Her initial blood culture positive for coag negative staph in 1 out of 2 bottles. Appears to be skin flora. Patient also had a severe acute metabolic encephalopathy, was initially treated with Precedex. Patient was called using cocaine in her room upon transfer to medical floor on 04/25/2020.   Assessment & Plan:   Active Problems:   Pneumonia due to COVID-19 virus   Anemia   COVID-19   Melena  #1.  Acute hypoxemic restaurant failure secondary to COVID-19 pneumonia. When I walk in the room, patient still off oxygen.  No significant hypoxemia.  Condition had improved, pending discharge options per Education officer, museum.  #2.  Severe acute on chronic blood loss anemia.  With iron deficient anemia. Condition has been stable.  She will need outpatient EGD scheduled.  3.  Sepsis secondary to Covid infection. Patient did not have additional fever.  Positive blood culture could be contaminants.  Repeat culture was negative.  Still covered with Unasyn for possible aspiration.  4.  Polysubstance abuse with severe  agitation. Patient mental status seem to be improved today.  She is oriented to time place and person.  5.  Moderate protein calorie malnutrition. Continue supplement..  6.  Diarrhea. Condition improving after discontinuation of stool softener.  7.  Hypokalemia and hypophosphatemia.   Recheck level tomorrow.   DVT prophylaxis:SCDs Code Status:Full Family Communication:None  Disposition Plan:  Patient came from:Home  Anticipated d/c place:May need SNF  Barriers to d/c OR conditions which need to be met to effect a safe d/c:   Consultants:  ICU  Procedures:None Antimicrobials: Unasyn.     Subjective: Patient no longer has any agitation or confusion today.  She came into the conversation. Denies any short of breath or cough. Diarrhea is improved. Denies any short of breath or cough.  Objective: Vitals:   04/27/20 1700 04/28/20 0000 04/28/20 0500 04/28/20 0750  BP: 114/75 (!) 90/40  (!) 128/50  Pulse: 76 71    Resp: _0 Temp: 99 F (37.2 C) 98.4 F (36.9 C)  98.4 F (36.9 C)  TempSrc: Oral Axillary  Oral  SpO2:  99%  97%  Weight:   72 kg   Height:       No intake or output data in the 24 hours ending 04/28/20 1251 Filed Weights   04/24/20 0500 04/25/20 0500 04/28/20 0500  Weight: 73.6 kg 72 kg 72 kg    Examination:  General exam: Appears calm and comfortable  Respiratory system: Clear to auscultation. Respiratory effort normal. Cardiovascular system: S1 & S2 heard, RRR. No JVD, murmurs, rubs, gallops or clicks. No pedal edema. Gastrointestinal system: Abdomen is nondistended, soft and nontender. No organomegaly or  masses felt. Normal bowel sounds heard. Central nervous system: Alert and oriented. No focal neurological deficits. Extremities: Symmetric 5 x 5 power. Skin: No rashes, lesions or ulcers Psychiatry: Judgement  and insight appear normal. Mood & affect appropriate.     Data Reviewed: I have personally reviewed following labs and imaging studies  CBC: Recent Labs  Lab 04/22/20 0321 04/22/20 0726 04/23/20 2041 04/23/20 2041 04/24/20 0358 04/24/20 0819 04/25/20 0351 04/25/20 0811 04/25/20 1115 04/25/20 1632 04/25/20 2032 04/25/20 2327 04/28/20 0600  WBC 5.2  --  3.4*  --  4.4  --  3.8*  --   --   --   --   --  3.8*  NEUTROABS 4.3  --  2.8  --  3.5  --  2.9  --   --   --   --   --  3.0  HGB 8.5*   < > 10.1*   < > 10.7*   < > 9.9*   < > 10.2* 10.9* 10.7* 9.8* 8.5*  HCT 27.0*   < > 34.0*   < > 35.4*   < > 33.4*   < > 34.6* 35.3* 36.5 32.5* 29.4*  MCV 78.5*  --  83.5  --  82.3  --  83.5  --   --   --   --   --  84.7  PLT 103*  --  68*  --  74*  --  65*  --   --   --   --   --  51*   < > = values in this interval not displayed.   Basic Metabolic Panel: Recent Labs  Lab 04/24/20 0358 04/25/20 0351 04/26/20 0756 04/27/20 1249 04/28/20 0600  NA 140 138 139 138 138  K 4.1 3.7 3.3* 3.5 3.7  CL 108 106 100 99 99  CO2 _0 GLUCOSE 90 95 81 109* 76  BUN _1 CREATININE 0.95 0.72 0.70 0.50 0.46  CALCIUM 7.3* 7.3* 7.6* 7.8* 7.9*  MG 1.9 1.8 1.8 1.8 1.8  PHOS 3.9 3.1 2.3* 2.0* 2.1*   GFR: Estimated Creatinine Clearance: 67.8 mL/min (by C-G formula based on SCr of 0.46 mg/dL). Liver Function Tests: Recent Labs  Lab 04/22/20 0321 04/23/20 0443 04/24/20 0358 04/25/20 0351  AST _2 ALT _3 ALKPHOS 51 49 51 51  BILITOT 0.8 0.7 0.6 0.6  PROT 6.1* 5.8* 5.8* 5.4*  ALBUMIN 2.5* 2.3* 2.3* 2.3*   No results for input(s): LIPASE, AMYLASE in the last 168 hours. Recent Labs  Lab 04/23/20 1448  AMMONIA 28   Coagulation Profile: No results for input(s): INR, PROTIME in the last 168 hours. Cardiac Enzymes: No results for input(s): CKTOTAL, CKMB, CKMBINDEX, TROPONINI in the last 168 hours. BNP (last 3 results) No results for input(s): PROBNP in the  last 8760 hours. HbA1C: No results for input(s): HGBA1C in the last 72 hours. CBG: Recent Labs  Lab 04/28/20 0425 04/28/20 0752 04/28/20 0754 04/28/20 0859 04/28/20 1112  GLUCAP 154* 68* 67* 106* 157*   Lipid Profile: No results for input(s): CHOL, HDL, LDLCALC, TRIG, CHOLHDL, LDLDIRECT in the last 72 hours. Thyroid Function Tests: No results for input(s): TSH, T4TOTAL, FREET4, T3FREE, THYROIDAB in the last 72 hours. Anemia Panel: No results for input(s): VITAMINB12, FOLATE, FERRITIN, TIBC, IRON, RETICCTPCT in the last 72 hours. Sepsis Labs: No results for input(s): PROCALCITON, LATICACIDVEN in the last 168 hours.  Recent  Results (from the past 240 hour(s))  MRSA PCR Screening     Status: Abnormal   Collection Time: 04/20/20 11:27 AM  Result Value Ref Range Status   MRSA by PCR POSITIVE (A) NEGATIVE Final    Comment:        The GeneXpert MRSA Assay (FDA approved for NASAL specimens only), is one component of a comprehensive MRSA colonization surveillance program. It is not intended to diagnose MRSA infection nor to guide or monitor treatment for MRSA infections. CRITICAL RESULT CALLED TO, READ BACK BY AND VERIFIED WITH: MIRANBA ROBLES _0  04/20/2020 TTG Performed at [facility], [address] [place], [place] [address]   Blood Culture (routine x 2)     Status: Abnormal   Collection Time: 04/20/20 11:54 AM   Specimen: BLOOD  Result Value Ref Range Status   Specimen Description   Final    BLOOD LT [facility] Performed at [facility], [address], [place], [place] [address]    Special Requests   Final    BOTTLES DRAWN AEROBIC AND ANAEROBIC Blood Culture results may not be optimal due to an excessive volume of blood received in culture bottles Performed at [facility], [address] [place], [place] [address]    Culture  Setup Time   Final    Organism ID to follow [place] CRITICAL  RESULT CALLED TO, READ BACK BY AND VERIFIED WITH: ALEX CHAPPELL ON 04/21/2020 AT 1231 TIK Performed at [facility], [address] [place], [place] [address]    Culture (A)  Final    VIRIDANS STREPTOCOCCUS STAPHYLOCOCCUS SPECIES (COAGULASE NEGATIVE) THE SIGNIFICANCE OF ISOLATING THIS ORGANISM FROM A SINGLE SET OF BLOOD CULTURES WHEN MULTIPLE SETS ARE DRAWN IS UNCERTAIN. PLEASE NOTIFY THE MICROBIOLOGY DEPARTMENT WITHIN ONE WEEK IF SPECIATION AND SENSITIVITIES ARE REQUIRED. Performed at [facility], [place] [address], [place], [place] [address]    Report Status 04/24/2020 FINAL  Final  Blood Culture (routine x 2)     Status: None   Collection Time: 04/20/20 11:54 AM   Specimen: BLOOD  Result Value Ref Range Status   Specimen Description BLOOD RT [facility]  Final   Special Requests   Final    BOTTLES DRAWN AEROBIC AND ANAEROBIC Blood Culture adequate volume   Culture   Final    NO GROWTH 5 DAYS Performed at [facility], [address] [place], [place] [address]    Report Status 04/25/2020 FINAL  Final  Blood Culture ID Panel (Reflexed)     Status: Abnormal   Collection Time: 04/20/20 11:54 AM  Result Value Ref Range Status   Enterococcus species NOT DETECTED NOT DETECTED Final   Listeria monocytogenes NOT DETECTED NOT DETECTED Final   Staphylococcus species NOT DETECTED NOT DETECTED Final   Staphylococcus aureus (BCID) NOT DETECTED NOT DETECTED Final   Streptococcus species DETECTED (A) NOT DETECTED Final    Comment: Not Enterococcus species, Streptococcus agalactiae, Streptococcus pyogenes, or Streptococcus pneumoniae. CRITICAL RESULT CALLED TO, READ BACK BY AND VERIFIED WITH: ALEX CHAPPELL ON 04/21/2020 AT 30 TIK    Streptococcus agalactiae NOT DETECTED NOT DETECTED Final   Streptococcus pneumoniae NOT DETECTED NOT DETECTED Final   Streptococcus pyogenes NOT DETECTED NOT DETECTED Final   Acinetobacter baumannii NOT DETECTED NOT DETECTED Final    Enterobacteriaceae species NOT DETECTED NOT DETECTED Final   Enterobacter cloacae complex NOT DETECTED NOT DETECTED Final   Escherichia coli NOT DETECTED NOT DETECTED  Final   Klebsiella oxytoca NOT DETECTED NOT DETECTED Final   Klebsiella pneumoniae NOT DETECTED NOT DETECTED Final   Proteus species NOT DETECTED NOT DETECTED Final   Serratia marcescens NOT DETECTED NOT DETECTED Final   Haemophilus influenzae NOT DETECTED NOT DETECTED Final   Neisseria meningitidis NOT DETECTED NOT DETECTED Final   Pseudomonas aeruginosa NOT DETECTED NOT DETECTED Final   Candida albicans NOT DETECTED NOT DETECTED Final   Candida glabrata NOT DETECTED NOT DETECTED Final   Candida krusei NOT DETECTED NOT DETECTED Final   Candida parapsilosis NOT DETECTED NOT DETECTED Final   Candida tropicalis NOT DETECTED NOT DETECTED Final    Comment: Performed at [facility], [address] [place], [place] [address]  SARS Coronavirus 2 by RT PCR (hospital order, performed in [place] hospital lab) Nasopharyngeal Nasopharyngeal Swab     Status: Abnormal   Collection Time: 04/20/20 12:57 PM   Specimen: Nasopharyngeal Swab  Result Value Ref Range Status   SARS Coronavirus 2 POSITIVE (A) NEGATIVE Final    Comment: RESULT CALLED TO, READ BACK BY AND VERIFIED WITH:  LEXIE OLIVER AT 0240 04/20/20 (NOTE) SARS-CoV-2 target nucleic acids are DETECTED SARS-CoV-2 RNA is generally detectable in upper respiratory specimens  during the acute phase of infection.  Positive results are indicative  of the presence of the identified virus, but do not rule out bacterial infection or co-infection with other pathogens not detected by the test.  Clinical correlation with patient history and  other diagnostic information is necessary to determine patient infection status.  The expected result is negative. Fact Sheet for Patients:   StrictlyIdeas.no  Fact Sheet for Healthcare Providers:     BankingDealers.co.za   This test is not yet approved or cleared by the [place] FDA and  has been authorized for detection and/or diagnosis of SARS-CoV-2 by FDA under an Emergency Use Authorization (EUA).  This EUA will remain in effect (meaning this test can be use d) for the duration of  the COVID-19 declaration under Section 564(b)(1) of the Act, 21 U.S.C. section 360-bbb-3(b)(1), unless the authorization is terminated or revoked sooner. Performed at [facility], [address] [place], [place] [address]   CULTURE, BLOOD (ROUTINE X 2) w Reflex to ID Panel     Status: None (Preliminary result)   Collection Time: 04/26/20  4:58 PM   Specimen: BLOOD  Result Value Ref Range Status   Specimen Description BLOOD BLOOD RIGHT HAND  Final   Special Requests   Final    BOTTLES DRAWN AEROBIC AND ANAEROBIC Blood Culture results may not be optimal due to an inadequate volume of blood received in culture bottles   Culture   Final    NO GROWTH 2 DAYS Performed at [facility], [address], [place],  [address]    Report Status PENDING  Incomplete  CULTURE, BLOOD (ROUTINE X 2) w Reflex to ID Panel     Status: None (Preliminary result)   Collection Time: 04/26/20  4:58 PM   Specimen: BLOOD  Result Value Ref Range Status   Specimen Description BLOOD BLOOD LEFT HAND  Final   Special Requests   Final    BOTTLES DRAWN AEROBIC AND ANAEROBIC Blood Culture results may not be optimal due to an inadequate volume of blood received in culture bottles   Culture   Final    NO GROWTH 2 DAYS Performed at [facility], [address] [place],  [address]    Report Status  PENDING  Incomplete         Radiology Studies: DG Chest 2 View  Result Date: 04/26/2020 CLINICAL DATA:  COVID-19 positive.  Hypertension. EXAM: CHEST - 2 VIEW COMPARISON:  Apr 24, 2020 FINDINGS: There is interstitial thickening and areas of patchy airspace  opacity bilaterally with patchy airspace opacity primarily in the lung bases. There appears to be resolution of previous small right pleural effusion. No new opacities are evident. Heart size is upper normal with pulmonary vascularity normal. No adenopathy. Bones are osteoporotic. Old healed fracture lateral left clavicle noted. IMPRESSION: Ill-defined interstitial thickening and patchy airspace opacity, likely multifocal pneumonia. Suspect atypical organism pneumonia given clinical history. Compared to 2 days prior, no new opacity evident. Slight clearing right upper lobe and apparent resolution of small right pleural effusion. Heart upper normal in size. No adenopathy evident. Bones osteoporotic. Electronically Signed   By: Lowella Grip III M.D.   On: 04/26/2020 15:58        Scheduled Meds: . sodium chloride   Intravenous Once  . vitamin C  500 mg Oral Daily  . B-complex with vitamin C  1 tablet Oral Daily  . chlorhexidine gluconate (MEDLINE KIT)  15 mL Mouth Rinse BID  . Chlorhexidine Gluconate Cloth  6 each Topical Daily  . cloNIDine  0.1 mg Oral BID  . dexamethasone (DECADRON) injection  6 mg Intravenous Q24H  . diazepam  5 mg Oral QID  . feeding supplement (ENSURE ENLIVE)  237 mL Oral TID BM  . folic acid  1 mg Oral Daily  . furosemide  40 mg Intravenous Daily  . insulin aspart  0-9 Units Subcutaneous Q4H  . melatonin  10 mg Oral QHS  . pantoprazole  40 mg Oral BID  . PHENobarbital  16.2 mg Oral BID  . thiamine  100 mg Oral Daily  . zinc sulfate  220 mg Oral Daily   Continuous Infusions: . sodium chloride Stopped (04/22/20 0502)  . ampicillin-sulbactam (UNASYN) IV 3 g (04/28/20 0501)  . lactated ringers Stopped (04/22/20 1651)     LOS: 8 days    Time spent: 28 minutes    Sharen Hones, MD [facility]   To contact the attending provider between 7A-7P or the covering provider during after hours 7P-7A, please log into the web site [email or web address] and access using  universal [place] password for that web site. If you do not have the password, please call the hospital operator.  04/28/2020, 12:51 PM

## 2020-04-29 LAB — BASIC METABOLIC PANEL
Anion gap: 9 (ref 5–15)
BUN: 16 mg/dL (ref 8–23)
CO2: 31 mmol/L (ref 22–32)
Calcium: 8.1 mg/dL — ABNORMAL LOW (ref 8.9–10.3)
Chloride: 98 mmol/L (ref 98–111)
Creatinine, Ser: 0.51 mg/dL (ref 0.44–1.00)
GFR calc Af Amer: >60 mL/min (ref >60)
GFR calc non Af Amer: >60 mL/min (ref >60)
Glucose, Bld: 73 mg/dL (ref 70–99)
Potassium: 3.8 mmol/L (ref 3.5–5.1)
Sodium: 138 mmol/L (ref 135–145)

## 2020-04-29 LAB — GLUCOSE, CAPILLARY
Glucose-Capillary: 84 mg/dL (ref 70–99)
Glucose-Capillary: 62 mg/dL — ABNORMAL LOW (ref 70–99)
Glucose-Capillary: 88 mg/dL (ref 70–99)
Glucose-Capillary: 159 mg/dL — ABNORMAL HIGH (ref 70–99)
Glucose-Capillary: 112 mg/dL — ABNORMAL HIGH (ref 70–99)

## 2020-04-29 LAB — PHOSPHORUS: Phosphorus: 3.9 mg/dL (ref 2.5–4.6)

## 2020-04-29 LAB — MAGNESIUM: Magnesium: 1.8 mg/dL (ref 1.7–2.4)

## 2020-04-29 LAB — VITAMIN B1: Vitamin B1 (Thiamine): 76.1 nmol/L (ref 66.5–200.0)

## 2020-04-29 NOTE — Progress Notes (Signed)
PROGRESS NOTE    Kylie Monroe  YQM:[id number] DOB: [birth year] DOA: 04/20/2020 PCP: Alwyn Pea, NP   Brief Narrative:  Patient is a 64 year old female with history of COPD, protein calorie malnutrition, hepatitis C, cirrhosis with splenomegaly,portal hypertension, essential hypertension and IV drug abuse who was initially admitted to the hospital on 5/14 with diagnosis of Covid pneumonia,severe anemia with hemoglobin 2.3. Her last use of cocaine was before admission. Chest x-ray at admission showed multifocal pneumonia, more on the right side. She is managed in ICU, she was also seen by gastroenterology, octreotide was started due to concern for upper GI bleed with portal hypertension. EGD was deferred until patient recovered from her pneumonia. Patient will transfer out of ICU on 5/19. Octreotide was discontinued before transfer, hemoglobin has been stable for a few days. Her initial blood culture positive for coag negative staph in 1 out of 2 bottles. Appears to be skin flora. Patient also had a severe acute metabolic encephalopathy, was initially treated with Precedex. Patient was called using cocaine in her room upon transfer to medical floor on 04/25/2020.   Assessment & Plan:   Active Problems:   Pneumonia due to COVID-19 virus   Anemia   COVID-19   Melena  #1. Acute hypoxemic respiratory failure secondary to COVID-19 pneumonia. Patient was still on oxygen at night, but no oxygen at daytime.  Her condition had improved.  #2.  Acute on chronic blood loss anemia. Recheck CBC tomorrow, will need outpatient scheduled EGD.  3.  Sepsis secondary to Covid infection. Blood culture most likely due to contaminant.  Repeat culture was negative.  No longer has any fever.  Discussed with ID, will discontinue antibiotics.  4.  Polysubstance abuse with agitation. Patient condition seem to be improved.  However, patient has significant confusion, I am not sure patient has the  capacity to make medical decisions.  We obtain psychiatry consult to evaluate.  5.  Moderate protein calorie malnutrition. Continue supplement.  6.  Diarrhea. Seem to be improving.  7.  Hypokalemia and hypophosphatemia. Level has normalized.  DVT prophylaxis:SCDs Code Status:Full Family Communication:None  Disposition Plan:  Patient came from:Home  Anticipated d/c place:May need SNF  Barriers to d/c OR conditions which need to be met to effect a safe d/c: Wish to go home, stepdaughter want her to go to nursing home.  Not sure patient is competent to make her medical decisions.  Obtain psychiatry consult.  Consultants:  ICU signed off  GI signed off.  Procedures:None Antimicrobials:None  Subjective: Patient appears to have some memory issues, occasional confusion.  No hypoxemia anymore.  No short of breath or cough.  Diarrhea had improved.  Objective: Vitals:   04/29/20 0001 04/29/20 0200 04/29/20 0500 04/29/20 0800  BP: 130/76   137/76  Pulse: 73 66  74  Resp:  13  18  Temp: 98.3 F (36.8 C)   98.5 F (36.9 C)  TempSrc: Oral   Oral  SpO2:  96%  98%  Weight:   62.3 kg   Height:        Intake/Output Summary (Last 24 hours) at 04/29/2020 1232 Last data filed at 04/29/2020 0400 Gross per 24 hour  Intake 576.08 ml  Output 500 ml  Net 76.08 ml   Filed Weights   04/25/20 0500 04/28/20 0500 04/29/20 0500  Weight: 72 kg 72 kg 62.3 kg    Examination:  General exam: Appears calm and comfortable  Respiratory system: Clear to auscultation. Respiratory effort normal. Cardiovascular system: S1 &  S2 heard, RRR. No JVD, murmurs, rubs, gallops or clicks. No pedal edema. Gastrointestinal system: Abdomen is nondistended, soft and nontender. No organomegaly or masses felt. Normal bowel sounds heard. Central nervous system: Alert and oriented x2. No  focal neurological deficits. Extremities: Symmetric 5 x 5 power. Skin: No rashes, lesions or ulcers Psychiatry: Judgement and insight appear normal. Mood & affect appropriate.     Data Reviewed: I have personally reviewed following labs and imaging studies  CBC: Recent Labs  Lab 04/23/20 2041 04/23/20 2041 04/24/20 0358 04/24/20 0819 04/25/20 0351 04/25/20 0811 04/25/20 1115 04/25/20 1632 04/25/20 2032 04/25/20 2327 04/28/20 0600  WBC 3.4*  --  4.4  --  3.8*  --   --   --   --   --  3.8*  NEUTROABS 2.8  --  3.5  --  2.9  --   --   --   --   --  3.0  HGB 10.1*   < > 10.7*   < > 9.9*   < > 10.2* 10.9* 10.7* 9.8* 8.5*  HCT 34.0*   < > 35.4*   < > 33.4*   < > 34.6* 35.3* 36.5 32.5* 29.4*  MCV 83.5  --  82.3  --  83.5  --   --   --   --   --  84.7  PLT 68*  --  74*  --  65*  --   --   --   --   --  51*   < > = values in this interval not displayed.   Basic Metabolic Panel: Recent Labs  Lab 04/25/20 0351 04/26/20 0756 04/27/20 1249 04/28/20 0600 04/29/20 0641  NA 138 139 138 138 138  K 3.7 3.3* 3.5 3.7 3.8  CL 106 100 99 99 98  CO2 _0 GLUCOSE 95 81 109* 76 73  BUN _1 CREATININE 0.72 0.70 0.50 0.46 0.51  CALCIUM 7.3* 7.6* 7.8* 7.9* 8.1*  MG 1.8 1.8 1.8 1.8 1.8  PHOS 3.1 2.3* 2.0* 2.1* 3.9   GFR: Estimated Creatinine Clearance: 63.3 mL/min (by C-G formula based on SCr of 0.51 mg/dL). Liver Function Tests: Recent Labs  Lab 04/23/20 0443 04/24/20 0358 04/25/20 0351  AST _2 ALT _3 ALKPHOS 49 51 51  BILITOT 0.7 0.6 0.6  PROT 5.8* 5.8* 5.4*  ALBUMIN 2.3* 2.3* 2.3*   No results for input(s): LIPASE, AMYLASE in the last 168 hours. Recent Labs  Lab 04/23/20 1448  AMMONIA 28   Coagulation Profile: No results for input(s): INR, PROTIME in the last 168 hours. Cardiac Enzymes: No results for input(s): CKTOTAL, CKMB, CKMBINDEX, TROPONINI in the last 168 hours. BNP (last 3 results) No results for input(s): PROBNP in the last  8760 hours. HbA1C: No results for input(s): HGBA1C in the last 72 hours. CBG: Recent Labs  Lab 04/28/20 2009 04/28/20 2356 04/29/20 0348 04/29/20 0837 04/29/20 1200  GLUCAP 160* 103* 84 62* 88   Lipid Profile: No results for input(s): CHOL, HDL, LDLCALC, TRIG, CHOLHDL, LDLDIRECT in the last 72 hours. Thyroid Function Tests: No results for input(s): TSH, T4TOTAL, FREET4, T3FREE, THYROIDAB in the last 72 hours. Anemia Panel: No results for input(s): VITAMINB12, FOLATE, FERRITIN, TIBC, IRON, RETICCTPCT in the last 72 hours. Sepsis Labs: No results for input(s): PROCALCITON, LATICACIDVEN in the last 168 hours.  Recent Results (from the past 240 hour(s))  MRSA PCR Screening  Status: Abnormal   Collection Time: 04/20/20 11:27 AM  Result Value Ref Range Status   MRSA by PCR POSITIVE (A) NEGATIVE Final    Comment:        The GeneXpert MRSA Assay (FDA approved for NASAL specimens only), is one component of a comprehensive MRSA colonization surveillance program. It is not intended to diagnose MRSA infection nor to guide or monitor treatment for MRSA infections. CRITICAL RESULT CALLED TO, READ BACK BY AND VERIFIED WITH: MIRANBA ROBLES _0  04/20/2020 TTG Performed at [facility], [address], [place], [place] [address]   Blood Culture (routine x 2)     Status: Abnormal   Collection Time: 04/20/20 11:54 AM   Specimen: BLOOD  Result Value Ref Range Status   Specimen Description   Final    BLOOD LT [facility] Performed at [facility], [address], [place], [place] [address]    Special Requests   Final    BOTTLES DRAWN AEROBIC AND ANAEROBIC Blood Culture results may not be optimal due to an excessive volume of blood received in culture bottles Performed at [facility], [address] [place], [place] [address]    Culture  Setup Time   Final    Organism ID to follow [place] CRITICAL RESULT  CALLED TO, READ BACK BY AND VERIFIED WITH: ALEX CHAPPELL ON 04/21/2020 AT 1231 TIK Performed at [facility], [address] [place], [place] [address]    Culture (A)  Final    VIRIDANS STREPTOCOCCUS STAPHYLOCOCCUS SPECIES (COAGULASE NEGATIVE) THE SIGNIFICANCE OF ISOLATING THIS ORGANISM FROM A SINGLE SET OF BLOOD CULTURES WHEN MULTIPLE SETS ARE DRAWN IS UNCERTAIN. PLEASE NOTIFY THE MICROBIOLOGY DEPARTMENT WITHIN ONE WEEK IF SPECIATION AND SENSITIVITIES ARE REQUIRED. Performed at [facility], [address], [place], [place] [address]    Report Status 04/24/2020 FINAL  Final  Blood Culture (routine x 2)     Status: None   Collection Time: 04/20/20 11:54 AM   Specimen: BLOOD  Result Value Ref Range Status   Specimen Description BLOOD RT [facility]  Final   Special Requests   Final    BOTTLES DRAWN AEROBIC AND ANAEROBIC Blood Culture adequate volume   Culture   Final    NO GROWTH 5 DAYS Performed at [facility], [address] [place],  [address]    Report Status 04/25/2020 FINAL  Final  Blood Culture ID Panel (Reflexed)     Status: Abnormal   Collection Time: 04/20/20 11:54 AM  Result Value Ref Range Status   Enterococcus species NOT DETECTED NOT DETECTED Final   Listeria monocytogenes NOT DETECTED NOT DETECTED Final   Staphylococcus species NOT DETECTED NOT DETECTED Final   Staphylococcus aureus (BCID) NOT DETECTED NOT DETECTED Final   Streptococcus species DETECTED (A) NOT DETECTED Final    Comment: Not Enterococcus species, Streptococcus agalactiae, Streptococcus pyogenes, or Streptococcus pneumoniae. CRITICAL RESULT CALLED TO, READ BACK BY AND VERIFIED WITH: ALEX CHAPPELL ON 04/21/2020 AT 75 TIK    Streptococcus agalactiae NOT DETECTED NOT DETECTED Final   Streptococcus pneumoniae NOT DETECTED NOT DETECTED Final   Streptococcus pyogenes NOT DETECTED NOT DETECTED Final   Acinetobacter baumannii NOT DETECTED NOT DETECTED Final   Enterobacteriaceae  species NOT DETECTED NOT DETECTED Final   Enterobacter cloacae complex NOT DETECTED NOT DETECTED Final   Escherichia coli NOT DETECTED NOT DETECTED Final   Klebsiella oxytoca NOT DETECTED NOT DETECTED Final   Klebsiella pneumoniae  NOT DETECTED NOT DETECTED Final   Proteus species NOT DETECTED NOT DETECTED Final   Serratia marcescens NOT DETECTED NOT DETECTED Final   Haemophilus influenzae NOT DETECTED NOT DETECTED Final   Neisseria meningitidis NOT DETECTED NOT DETECTED Final   Pseudomonas aeruginosa NOT DETECTED NOT DETECTED Final   Candida albicans NOT DETECTED NOT DETECTED Final   Candida glabrata NOT DETECTED NOT DETECTED Final   Candida krusei NOT DETECTED NOT DETECTED Final   Candida parapsilosis NOT DETECTED NOT DETECTED Final   Candida tropicalis NOT DETECTED NOT DETECTED Final    Comment: Performed at [facility], [address] [place], [place] [address]  SARS Coronavirus 2 by RT PCR (hospital order, performed in [facility] hospital lab) Nasopharyngeal Nasopharyngeal Swab     Status: Abnormal   Collection Time: 04/20/20 12:57 PM   Specimen: Nasopharyngeal Swab  Result Value Ref Range Status   SARS Coronavirus 2 POSITIVE (A) NEGATIVE Final    Comment: RESULT CALLED TO, READ BACK BY AND VERIFIED WITH:  LEXIE OLIVER AT 6812 04/20/20 (NOTE) SARS-CoV-2 target nucleic acids are DETECTED SARS-CoV-2 RNA is generally detectable in upper respiratory specimens  during the acute phase of infection.  Positive results are indicative  of the presence of the identified virus, but do not rule out bacterial infection or co-infection with other pathogens not detected by the test.  Clinical correlation with patient history and  other diagnostic information is necessary to determine patient infection status.  The expected result is negative. Fact Sheet for Patients:   StrictlyIdeas.no  Fact Sheet for Healthcare Providers:    BankingDealers.co.za   This test is not yet approved or cleared by the [place] FDA and  has been authorized for detection and/or diagnosis of SARS-CoV-2 by FDA under an Emergency Use Authorization (EUA).  This EUA will remain in effect (meaning this test can be use d) for the duration of  the COVID-19 declaration under Section 564(b)(1) of the Act, 21 U.S.C. section 360-bbb-3(b)(1), unless the authorization is terminated or revoked sooner. Performed at [facility], [address] [place], [place] [address]   CULTURE, BLOOD (ROUTINE X 2) w Reflex to ID Panel     Status: None (Preliminary result)   Collection Time: 04/26/20  4:58 PM   Specimen: BLOOD  Result Value Ref Range Status   Specimen Description BLOOD BLOOD RIGHT HAND  Final   Special Requests   Final    BOTTLES DRAWN AEROBIC AND ANAEROBIC Blood Culture results may not be optimal due to an inadequate volume of blood received in culture bottles   Culture   Final    NO GROWTH 3 DAYS Performed at [facility], [address], [place], [place] [address]    Report Status PENDING  Incomplete  CULTURE, BLOOD (ROUTINE X 2) w Reflex to ID Panel     Status: None (Preliminary result)   Collection Time: 04/26/20  4:58 PM   Specimen: BLOOD  Result Value Ref Range Status   Specimen Description BLOOD BLOOD LEFT HAND  Final   Special Requests   Final    BOTTLES DRAWN AEROBIC AND ANAEROBIC Blood Culture results may not be optimal due to an inadequate volume of blood received in culture bottles   Culture   Final    NO GROWTH 3 DAYS Performed at [facility], [address] [place], [place] [address]    Report Status PENDING  Incomplete         Radiology Studies: No results  found.      Scheduled Meds: . sodium chloride   Intravenous Once  . vitamin C  500 mg Oral Daily  . B-complex with vitamin C  1 tablet Oral Daily  . chlorhexidine gluconate (MEDLINE KIT)  15 mL  Mouth Rinse BID  . Chlorhexidine Gluconate Cloth  6 each Topical Daily  . cloNIDine  0.1 mg Oral BID  . diazepam  5 mg Oral QID  . feeding supplement (ENSURE ENLIVE)  237 mL Oral TID BM  . folic acid  1 mg Oral Daily  . insulin aspart  0-9 Units Subcutaneous Q4H  . melatonin  10 mg Oral QHS  . pantoprazole  40 mg Oral BID  . PHENobarbital  16.2 mg Oral BID  . thiamine  100 mg Oral Daily  . zinc sulfate  220 mg Oral Daily   Continuous Infusions: . sodium chloride Stopped (04/22/20 0502)  . ampicillin-sulbactam (UNASYN) IV 3 g (04/29/20 0932)  . lactated ringers Stopped (04/22/20 1651)     LOS: 9 days    Time spent: 25 minutes    Sharen Hones, MD [facility]   To contact the attending provider between 7A-7P or the covering provider during after hours 7P-7A, please log into the web site [email or web address] and access using universal [place] password for that web site. If you do not have the password, please call the hospital operator.  04/29/2020, 12:32 PM

## 2020-04-29 NOTE — Progress Notes (Signed)
Patient agitated and states she is going to leave today no matter what. Patient dressed, attempting to leave room, opens door trying to go out. Directed back to room by sitter. Patient cussing and slammed down walker. Patient told she does not have discharge orders and she can not go out in the hallway with her Covid precautions. PRN geodon given. Patient assisted back to bed and settled down, sitter at bedside.

## 2020-04-30 DIAGNOSIS — R7881 Bacteremia: Secondary | ICD-10-CM

## 2020-04-30 DIAGNOSIS — B955 Unspecified streptococcus as the cause of diseases classified elsewhere: Secondary | ICD-10-CM

## 2020-04-30 LAB — CBC WITH DIFFERENTIAL/PLATELET
Abs Immature Granulocytes: 0.04 10*3/uL (ref 0.00–0.07)
Basophils Absolute: 0 10*3/uL (ref 0.0–0.1)
Basophils Relative: 0 %
Eosinophils Absolute: 0.1 10*3/uL (ref 0.0–0.5)
Eosinophils Relative: 2 %
HCT: 32.7 % — ABNORMAL LOW (ref 36.0–46.0)
Hemoglobin: 9.6 g/dL — ABNORMAL LOW (ref 12.0–15.0)
Immature Granulocytes: 1 %
Lymphocytes Relative: 12 %
Lymphs Abs: 0.8 10*3/uL (ref 0.7–4.0)
MCH: 24.7 pg — ABNORMAL LOW (ref 26.0–34.0)
MCHC: 29.4 g/dL — ABNORMAL LOW (ref 30.0–36.0)
MCV: 84.3 fL (ref 80.0–100.0)
Monocytes Absolute: 0.4 10*3/uL (ref 0.1–1.0)
Monocytes Relative: 7 %
Neutro Abs: 5.1 10*3/uL (ref 1.7–7.7)
Neutrophils Relative %: 78 %
Platelets: 81 10*3/uL — ABNORMAL LOW (ref 150–400)
RBC: 3.88 MIL/uL (ref 3.87–5.11)
RDW: 23.5 % — ABNORMAL HIGH (ref 11.5–15.5)
Smear Review: NORMAL
WBC: 6.5 10*3/uL (ref 4.0–10.5)
nRBC: 0 % (ref 0.0–0.2)

## 2020-04-30 LAB — GLUCOSE, CAPILLARY
Glucose-Capillary: 141 mg/dL — ABNORMAL HIGH (ref 70–99)
Glucose-Capillary: 65 mg/dL — ABNORMAL LOW (ref 70–99)
Glucose-Capillary: 88 mg/dL (ref 70–99)
Glucose-Capillary: 93 mg/dL (ref 70–99)

## 2020-04-30 LAB — BASIC METABOLIC PANEL
Anion gap: 10 (ref 5–15)
BUN: 20 mg/dL (ref 8–23)
CO2: 28 mmol/L (ref 22–32)
Calcium: 8.8 mg/dL — ABNORMAL LOW (ref 8.9–10.3)
Chloride: 101 mmol/L (ref 98–111)
Creatinine, Ser: 0.7 mg/dL (ref 0.44–1.00)
GFR calc Af Amer: >60 mL/min (ref >60)
GFR calc non Af Amer: >60 mL/min (ref >60)
Glucose, Bld: 107 mg/dL — ABNORMAL HIGH (ref 70–99)
Potassium: 3.6 mmol/L (ref 3.5–5.1)
Sodium: 139 mmol/L (ref 135–145)

## 2020-04-30 LAB — PHOSPHORUS: Phosphorus: 2.1 mg/dL — ABNORMAL LOW (ref 2.5–4.6)

## 2020-04-30 LAB — MAGNESIUM: Magnesium: 2 mg/dL (ref 1.7–2.4)

## 2020-04-30 MED ORDER — ASCORBIC ACID 500 MG PO TABS: 500.0000 mg | ORAL_TABLET | Freq: Every day | ORAL | 0 refills | Status: AC

## 2020-04-30 MED ORDER — PANTOPRAZOLE SODIUM 40 MG PO TBEC: 40.0000 mg | DELAYED_RELEASE_TABLET | Freq: Two times a day (BID) | ORAL | 0 refills | Status: AC

## 2020-04-30 MED ORDER — SODIUM PHOSPHATES 45 MMOLE/15ML IV SOLN
20.0000 mmol | Freq: Once | INTRAVENOUS | Status: AC
  Administered 2020-04-30: 13:00:00 20 mmol via INTRAVENOUS
  Filled 2020-04-30: qty 6.67

## 2020-04-30 MED ORDER — THIAMINE HCL 100 MG PO TABS: 100.0000 mg | ORAL_TABLET | Freq: Every day | ORAL | 0 refills | Status: AC

## 2020-04-30 MED ORDER — ZINC SULFATE 220 (50 ZN) MG PO CAPS: 220.0000 mg | ORAL_CAPSULE | Freq: Every day | ORAL | 0 refills | Status: AC

## 2020-04-30 NOTE — Progress Notes (Signed)
Inpatient Diabetes Program Recommendations  AACE/ADA: New Consensus Statement on Inpatient Glycemic Control (2015)  Target Ranges:  Prepandial:   less than 140 mg/dL      Peak postprandial:   less than 180 mg/dL (1-2 hours)      Critically ill patients:  140 - 180 mg/dL   Lab Results  Component Value Date   GLUCAP 88 04/30/2020    Review of Glycemic Control Results for ASHLA, MURPH (MRN [id number]) as of 04/30/2020 13:56  Ref. Range 04/30/2020 04:24 04/30/2020 07:52 04/30/2020 12:19  Glucose-Capillary Latest Ref Range: 70 - 99 mg/dL 948 (H) 65 (L) 88   Diabetes history: None Current orders for Inpatient glycemic control:  Novolog sensitive q 4 hours Inpatient Diabetes Program Recommendations:   Patient does not seem to need Novolog correction at this time.  Please consider d/c of Novolog correction.   Thanks  Beryl Meager, RN, BC-ADM Inpatient Diabetes Coordinator Pager [phone] (8a-5p)

## 2020-04-30 NOTE — Discharge Instructions (Signed)
1.  Follow-up with PCP in 1 week time. 2.  Self quarantine for 14 days from date of diagnosis of COVID

## 2020-04-30 NOTE — Progress Notes (Signed)
ID Pt doing well No complaits Wants to go home Awake and alert Patient Vitals for the past 24 hrs:  BP Temp Temp src Pulse Resp SpO2  04/30/20 0747 116/81 98.5 F (36.9 C) -- (!) 102 17 94 %  04/30/20 0036 (!) 143/74 (!) 97.5 F (36.4 C) Axillary 67 16 91 %   Chest b/l air entry [id number] Abd soft Pale Edema legs much improved   Labs CBC Latest Ref Rng & Units 04/30/2020 04/28/2020 04/25/2020  WBC 4.0 - 10.5 K/uL 6.5 3.8(L) -  Hemoglobin 12.0 - 15.0 g/dL 4.6(E) 7.0(J) 5.0(K)  Hematocrit 36.0 - 46.0 % 32.7(L) 29.4(L) 32.5(L)  Platelets 150 - 400 K/uL 81(L) 51(L) -    CMP Latest Ref Rng & Units 04/30/2020 04/29/2020 04/28/2020  Glucose 70 - 99 mg/dL 938(H) 73 76  BUN 8 - 23 mg/dL 20 16 11   Creatinine 0.44 - 1.00 mg/dL 8.29 9.37  Sodium 135 - 145 mmol/L 139 138 138  Potassium 3.5 - 5.1 mmol/L 3.6 3.8 3.7  Chloride 98 - 111 mmol/L 101 98 99  CO2 22 - 32 mmol/L 28 31 31   Calcium 8.9 - 10.3 mg/dL 1.69) 8.1(L) 7.9(L)  Total Protein 6.5 - 8.1 g/dL - - -  Total Bilirubin 0.3 - 1.2 mg/dL - - -  Alkaline Phos 38 - 126 U/L - - -  AST 15 - 41 U/L - - -  ALT 0 - 44 U/L - - -    Impression/recommendation  Acute anemia due to GI loss- -s/p PRBC -Hb much improved  Strep virdans bacteremia 1 of 4- because of GI bleed, fever  was treated like a pathogen with antibiotics for 7 days DC over th weekend and she is doing well  COVD 19 illness with b/l infiltrates- received remdisivir and decadron- very stable  ID will sign off-

## 2020-04-30 NOTE — Progress Notes (Signed)
PROGRESS NOTE    Kylie Monroe  QQP:[id number] DOB: 09/16/1956 DOA: 04/20/2020 PCP: Alwyn Pea, NP (Confirm with patient/family/NH records and if not entered, this HAS to be entered at [facility] point of entry. "No PCP" if truly none.)  Chief complaint: Agitation   Brief Narrative: (Start on day 1 of progress note - keep it brief and live) Patient is a 64 year old female with history of COPD, protein calorie malnutrition, hepatitis C, cirrhosis with splenomegaly,portal hypertension, essential hypertension and IV drug abuse who was initially admitted to the hospital on 5/14 with diagnosis of Covid pneumonia,severe anemia with hemoglobin 2.3. Her last use of cocaine was before admission. Chest x-ray at admission showed multifocal pneumonia, more on the right side. She is managed in ICU, she was also seen by gastroenterology, octreotide was started due to concern for upper GI bleed with portal hypertension. EGD was deferred until patient recovered from her pneumonia. Patient will transfer out of ICU on 5/19. Octreotide was discontinued before transfer, hemoglobin has been stable for a few days. Her initial blood culture positive for coag negative staph in 1 out of 2 bottles. Appears to be skin flora. Patient also had a severe acute metabolic encephalopathy, was initially treated with Precedex. Patient was called using cocaine in her room upon transfer to medical floor on 04/25/2020.  5/24.  Patient condition had improved.  Pending evaluation by psychiatry for decision-making capacity.   Assessment & Plan:   Active Problems:   Pneumonia due to COVID-19 virus   Anemia   COVID-19   Melena  #1.  Acute hypoxemic restaurant failure secondary to COVID-19 pneumonia. Condition had improved.  Patient is off oxygen.  She did not require any oxygen overnight.  2.  Acute on chronic blood loss anemia. Hemoglobin stable, will need outpatient schedule EGD.  3.  Sepsis. Condition has  resolved.  4.  Polysubstance abuse with agitation. Patient condition improving.  She still getting agitated occasionally.  I have requested psychiatry evaluation to determine if the patient has decision-making capacity.  If patient determined to be competent, she will be discharged home based on patient preference.  However, if she cannot make her own decision, will have to be placed based on family's preference.  5.  Moderate protein calorie malnutrition. Continue supplement.  6.  Diarrhea.  Condition improved.  7.  Hypokalemia and hypophosphatemia.  Potassium has normalized, continue supplement phosphorus.  DVT prophylaxis:SCDs Code Status:Full Family Communication:None  Disposition Plan:  Patient came from:Home  Anticipated d/c place:May need SNF  Barriers to d/c OR conditions which need to be met to effect a safe d/c: Wish to go home, stepdaughter want her to go to nursing home.  Not sure patient is competent to make her medical decisions.  Pending psychiatry evaluation.  Consultants:  ICU signed off  GI signed off.  Procedures:None Antimicrobials:None  Subjective: Patient occasionally is confused, but no agitation today.  She does not seem to understand her medical conditions. She denies any short of breath or cough, She no longer has any diarrhea nausea vomiting. No fever or chills.  Objective: Vitals:   04/29/20 0500 04/29/20 0800 04/30/20 0036 04/30/20 0747  BP:  137/76 (!) 143/74 116/81  Pulse:  74 67 (!) 102  Resp:  18 16 17   Temp:  98.5 F (36.9 C) (!) 97.5 F (36.4 C) 98.5 F (36.9 C)  TempSrc:  Oral Axillary   SpO2:  98% 91% 94%  Weight: 62.3 kg     Height:  No intake or output data in the 24 hours ending 04/30/20 0830 Filed Weights   04/25/20 0500 04/28/20 0500 04/29/20 0500  Weight: 72 kg 72 kg 62.3 kg     Examination:  General exam: Appears calm and comfortable  Respiratory system: Clear to auscultation. Respiratory effort normal. Cardiovascular system: S1 & S2 heard, RRR. No JVD, murmurs, rubs, gallops or clicks. No pedal edema. Gastrointestinal system: Abdomen is nondistended, soft and nontender. No organomegaly or masses felt. Normal bowel sounds heard. Central nervous system: Alert and oriented x2. No focal neurological deficits. Extremities: Symmetric 5 x 5 power. Skin: No rashes, lesions or ulcers Psychiatry: Judgement and insight appear normal. Mood & affect appropriate.     Data Reviewed: I have personally reviewed following labs and imaging studies  CBC: Recent Labs  Lab 04/23/20 2041 04/23/20 2041 04/24/20 0358 04/24/20 0819 04/25/20 0351 04/25/20 0811 04/25/20 1632 04/25/20 2032 04/25/20 2327 04/28/20 0600 04/30/20 0635  WBC 3.4*  --  4.4  --  3.8*  --   --   --   --  3.8* 6.5  NEUTROABS 2.8  --  3.5  --  2.9  --   --   --   --  3.0 5.1  HGB 10.1*   < > 10.7*   < > 9.9*   < > 10.9* 10.7* 9.8* 8.5* 9.6*  HCT 34.0*   < > 35.4*   < > 33.4*   < > 35.3* 36.5 32.5* 29.4* 32.7*  MCV 83.5  --  82.3  --  83.5  --   --   --   --  84.7 84.3  PLT 68*  --  74*  --  65*  --   --   --   --  51* 81*   < > = values in this interval not displayed.   Basic Metabolic Panel: Recent Labs  Lab 04/26/20 0756 04/27/20 1249 04/28/20 0600 04/29/20 0641 04/30/20 0635  NA 139 138 138 138 139  K 3.3* 3.5 3.7 3.8 3.6  CL 100 99 99 98 101  CO2 31 31 31 31 28   GLUCOSE 81 109* 76 73 107*  BUN 15 8 11 16 20   CREATININE 0.70 0.50 0.46 0.51 0.70  CALCIUM 7.6* 7.8* 7.9* 8.1* 8.8*  MG 1.8 1.8 1.8 1.8 2.0  PHOS 2.3* 2.0* 2.1* 3.9 2.1*   GFR: Estimated Creatinine Clearance: 63.3 mL/min (by C-G formula based on SCr of 0.7 mg/dL). Liver Function Tests: Recent Labs  Lab 04/24/20 0358 04/25/20 0351  AST 19 19  ALT 9 9  ALKPHOS 51 51  BILITOT 0.6 0.6  PROT 5.8* 5.4*  ALBUMIN  2.3* 2.3*   No results for input(s): LIPASE, AMYLASE in the last 168 hours. Recent Labs  Lab 04/23/20 1448  AMMONIA 28   Coagulation Profile: No results for input(s): INR, PROTIME in the last 168 hours. Cardiac Enzymes: No results for input(s): CKTOTAL, CKMB, CKMBINDEX, TROPONINI in the last 168 hours. BNP (last 3 results) No results for input(s): PROBNP in the last 8760 hours. HbA1C: No results for input(s): HGBA1C in the last 72 hours. CBG: Recent Labs  Lab 04/29/20 1649 04/29/20 2026 04/30/20 0034 04/30/20 0424 04/30/20 0752  GLUCAP 159* 112* 93 141* 65*   Lipid Profile: No results for input(s): CHOL, HDL, LDLCALC, TRIG, CHOLHDL, LDLDIRECT in the last 72 hours. Thyroid Function Tests: No results for input(s): TSH, T4TOTAL, FREET4, T3FREE, THYROIDAB in the last 72 hours. Anemia Panel: No results for input(s): VITAMINB12,  FOLATE, FERRITIN, TIBC, IRON, RETICCTPCT in the last 72 hours. Sepsis Labs: No results for input(s): PROCALCITON, LATICACIDVEN in the last 168 hours.  Recent Results (from the past 240 hour(s))  MRSA PCR Screening     Status: Abnormal   Collection Time: 04/20/20 11:27 AM  Result Value Ref Range Status   MRSA by PCR POSITIVE (A) NEGATIVE Final    Comment:        The GeneXpert MRSA Assay (FDA approved for NASAL specimens only), is one component of a comprehensive MRSA colonization surveillance program. It is not intended to diagnose MRSA infection nor to guide or monitor treatment for MRSA infections. CRITICAL RESULT CALLED TO, READ BACK BY AND VERIFIED WITH: MIRANBA ROBLES @1921  04/20/2020 TTG Performed at [facility], [address], [place], [place] [address]   Blood Culture (routine x 2)     Status: Abnormal   Collection Time: 04/20/20 11:54 AM   Specimen: BLOOD  Result Value Ref Range Status   Specimen Description   Final    BLOOD LT [facility] Performed at [facility], [address], [place], [place] [address]     Special Requests   Final    BOTTLES DRAWN AEROBIC AND ANAEROBIC Blood Culture results may not be optimal due to an excessive volume of blood received in culture bottles Performed at [facility], [address] [place], [place] [address]    Culture  Setup Time   Final    Organism ID to follow [place] CRITICAL RESULT CALLED TO, READ BACK BY AND VERIFIED WITH: ALEX CHAPPELL ON 04/21/2020 AT 1231 TIK Performed at [facility], [address] [place], [place] [address]    Culture (A)  Final    VIRIDANS STREPTOCOCCUS STAPHYLOCOCCUS SPECIES (COAGULASE NEGATIVE) THE SIGNIFICANCE OF ISOLATING THIS ORGANISM FROM A SINGLE SET OF BLOOD CULTURES WHEN MULTIPLE SETS ARE DRAWN IS UNCERTAIN. PLEASE NOTIFY THE MICROBIOLOGY DEPARTMENT WITHIN ONE WEEK IF SPECIATION AND SENSITIVITIES ARE REQUIRED. Performed at [facility], [place] [address], [place], [place] [address]    Report Status 04/24/2020 FINAL  Final  Blood Culture (routine x 2)     Status: None   Collection Time: 04/20/20 11:54 AM   Specimen: BLOOD  Result Value Ref Range Status   Specimen Description BLOOD RT [facility]  Final   Special Requests   Final    BOTTLES DRAWN AEROBIC AND ANAEROBIC Blood Culture adequate volume   Culture   Final    NO GROWTH 5 DAYS Performed at [facility], [address] [place], [place] [address]    Report Status 04/25/2020 FINAL  Final  Blood Culture ID Panel (Reflexed)     Status: Abnormal   Collection Time: 04/20/20 11:54 AM  Result Value Ref Range Status   Enterococcus species NOT DETECTED NOT DETECTED Final   Listeria monocytogenes NOT DETECTED NOT DETECTED Final   Staphylococcus species NOT DETECTED NOT DETECTED Final   Staphylococcus aureus (BCID) NOT DETECTED NOT DETECTED Final   Streptococcus species DETECTED (A) NOT DETECTED Final    Comment: Not Enterococcus species, Streptococcus agalactiae, Streptococcus pyogenes, or  Streptococcus pneumoniae. CRITICAL RESULT CALLED TO, READ BACK BY AND VERIFIED WITH: ALEX CHAPPELL ON 04/21/2020 AT 51 TIK    Streptococcus agalactiae NOT DETECTED NOT DETECTED Final   Streptococcus pneumoniae NOT DETECTED NOT DETECTED Final   Streptococcus pyogenes NOT DETECTED NOT DETECTED Final   Acinetobacter baumannii NOT DETECTED NOT DETECTED Final  Enterobacteriaceae species NOT DETECTED NOT DETECTED Final   Enterobacter cloacae complex NOT DETECTED NOT DETECTED Final   Escherichia coli NOT DETECTED NOT DETECTED Final   Klebsiella oxytoca NOT DETECTED NOT DETECTED Final   Klebsiella pneumoniae NOT DETECTED NOT DETECTED Final   Proteus species NOT DETECTED NOT DETECTED Final   Serratia marcescens NOT DETECTED NOT DETECTED Final   Haemophilus influenzae NOT DETECTED NOT DETECTED Final   Neisseria meningitidis NOT DETECTED NOT DETECTED Final   Pseudomonas aeruginosa NOT DETECTED NOT DETECTED Final   Candida albicans NOT DETECTED NOT DETECTED Final   Candida glabrata NOT DETECTED NOT DETECTED Final   Candida krusei NOT DETECTED NOT DETECTED Final   Candida parapsilosis NOT DETECTED NOT DETECTED Final   Candida tropicalis NOT DETECTED NOT DETECTED Final    Comment: Performed at [facility], [address] [place], [place] [address]  SARS Coronavirus 2 by RT PCR (hospital order, performed in [place] hospital lab) Nasopharyngeal Nasopharyngeal Swab     Status: Abnormal   Collection Time: 04/20/20 12:57 PM   Specimen: Nasopharyngeal Swab  Result Value Ref Range Status   SARS Coronavirus 2 POSITIVE (A) NEGATIVE Final    Comment: RESULT CALLED TO, READ BACK BY AND VERIFIED WITH:  LEXIE OLIVER AT 9242 04/20/20 (NOTE) SARS-CoV-2 target nucleic acids are DETECTED SARS-CoV-2 RNA is generally detectable in upper respiratory specimens  during the acute phase of infection.  Positive results are indicative  of the presence of the identified virus, but do not rule  out bacterial infection or co-infection with other pathogens not detected by the test.  Clinical correlation with patient history and  other diagnostic information is necessary to determine patient infection status.  The expected result is negative. Fact Sheet for Patients:   StrictlyIdeas.no  Fact Sheet for Healthcare Providers:   BankingDealers.co.za   This test is not yet approved or cleared by the [place] FDA and  has been authorized for detection and/or diagnosis of SARS-CoV-2 by FDA under an Emergency Use Authorization (EUA).  This EUA will remain in effect (meaning this test can be use d) for the duration of  the COVID-19 declaration under Section 564(b)(1) of the Act, 21 U.S.C. section 360-bbb-3(b)(1), unless the authorization is terminated or revoked sooner. Performed at [facility], [address] [place], [place] [address]   CULTURE, BLOOD (ROUTINE X 2) w Reflex to ID Panel     Status: None (Preliminary result)   Collection Time: 04/26/20  4:58 PM   Specimen: BLOOD  Result Value Ref Range Status   Specimen Description BLOOD BLOOD RIGHT HAND  Final   Special Requests   Final    BOTTLES DRAWN AEROBIC AND ANAEROBIC Blood Culture results may not be optimal due to an inadequate volume of blood received in culture bottles   Culture   Final    NO GROWTH 4 DAYS Performed at [facility], [address], [place], [place] [address]    Report Status PENDING  Incomplete  CULTURE, BLOOD (ROUTINE X 2) w Reflex to ID Panel     Status: None (Preliminary result)   Collection Time: 04/26/20  4:58 PM   Specimen: BLOOD  Result Value Ref Range Status   Specimen Description BLOOD BLOOD LEFT HAND  Final   Special Requests   Final    BOTTLES DRAWN AEROBIC AND ANAEROBIC Blood Culture results may not be optimal due to an inadequate volume of blood received in culture bottles   Culture   Final  NO GROWTH 4  DAYS Performed at [facility], [address] [place], [place] [address]    Report Status PENDING  Incomplete         Radiology Studies: No results found.      Scheduled Meds: . sodium chloride   Intravenous Once  . vitamin C  500 mg Oral Daily  . B-complex with vitamin C  1 tablet Oral Daily  . chlorhexidine gluconate (MEDLINE KIT)  15 mL Mouth Rinse BID  . Chlorhexidine Gluconate Cloth  6 each Topical Daily  . cloNIDine  0.1 mg Oral BID  . diazepam  5 mg Oral QID  . feeding supplement (ENSURE ENLIVE)  237 mL Oral TID BM  . folic acid  1 mg Oral Daily  . insulin aspart  0-9 Units Subcutaneous Q4H  . melatonin  10 mg Oral QHS  . pantoprazole  40 mg Oral BID  . PHENobarbital  16.2 mg Oral BID  . thiamine  100 mg Oral Daily  . zinc sulfate  220 mg Oral Daily   Continuous Infusions: . sodium chloride Stopped (04/22/20 0502)  . lactated ringers Stopped (04/22/20 1651)     LOS: 10 days    Time spent: 28 minutes    Sharen Hones, MD [facility]   To contact the attending provider between 7A-7P or the covering provider during after hours 7P-7A, please log into the web site [email or web address] and access using universal [place] password for that web site. If you do not have the password, please call the hospital operator.  04/30/2020, 8:30 AM

## 2020-04-30 NOTE — Progress Notes (Signed)
Physical Therapy Treatment Patient Details Name: Kylie Monroe MRN: [id number] DOB: May 24, 1956 Today's Date: 04/30/2020    History of Present Illness Kylie Monroe is a 64yoF who comes to [facility] on 5/14 c facial swelling, CP, LEE. Pt admitted with COVID-19 PNA, also had ABLA c Hb: 2.3 upon arrival Pt's significant other is recently deceased on 04-23-2023 2/2 complications related to COVID19. PMH: COPD, malnutrition, HepC, HTN, IV drug abuse, crack/cocaine use.     PT Comments    Pt seen per nursing request to assist with d/c planning.  Pt in room and is able to walk with RW and min guard/supervision.  No LOB or buckling but does exhibit some unsafe behaviors but does not need any physical assistance to correct.  Behaviors seem choice/personality and not necessarily balance deficits.  Will adjust discharge recommendations given overall improvement in mobility.   Follow Up Recommendations  Home health PT;Supervision - Intermittent     Equipment Recommendations  Rolling walker with 5" wheels    Recommendations for Other Services       Precautions / Restrictions Precautions Precautions: Fall Restrictions Weight Bearing Restrictions: No    Mobility  Bed Mobility               General bed mobility comments: sitting EOB before and after.  anticipate no problems given gait/transfer ability  Transfers Overall transfer level: Modified independent Equipment used: Rolling walker (2 wheeled) Transfers: Sit to/from Stand Sit to Stand: Modified independent (Device/Increase time);Supervision            Ambulation/Gait Ambulation/Gait assistance: Min guard;Supervision Gait Distance (Feet): 40 Feet Assistive device: Rolling walker (2 wheeled) Gait Pattern/deviations: Step-through pattern;Trunk flexed Gait velocity: increased   General Gait Details: impulsive with poor safety but no LOB or buckling noted.   Stairs             Wheelchair Mobility    Modified Rankin  (Stroke Patients Only)       Balance Overall balance assessment: Mild deficits observed, not formally tested;Needs assistance   Sitting balance-Leahy Scale: Normal     Standing balance support: Bilateral upper extremity supported Standing balance-Leahy Scale: Good                              Cognition Arousal/Alertness: Awake/alert Behavior During Therapy: WFL for tasks assessed/performed Overall Cognitive Status: Impaired/Different from baseline                                        Exercises      General Comments        Pertinent Vitals/Pain Pain Assessment: No/denies pain    Home Living                      Prior Function            PT Goals (current goals can now be found in the care plan section) Progress towards PT goals: Progressing toward goals    Frequency    Min 2X/week      PT Plan Discharge plan needs to be updated    Co-evaluation              AM-PAC PT "6 Clicks" Mobility   Outcome Measure  Help needed turning from your back to your side while in a flat bed without using bedrails?: None  Help needed moving from lying on your back to sitting on the side of a flat bed without using bedrails?: None Help needed moving to and from a bed to a chair (including a wheelchair)?: None Help needed standing up from a chair using your arms (e.g., wheelchair or bedside chair)?: None Help needed to walk in hospital room?: None Help needed climbing 3-5 steps with a railing? : A Little 6 Click Score: 23    End of Session   Activity Tolerance: Patient tolerated treatment well Patient left: in bed;with call bell/phone within reach;with bed alarm set Nurse Communication: Mobility status       Time: 6270-3500 PT Time Calculation (min) (ACUTE ONLY): 14 min  Charges:  $Gait Training: 8-22 mins                    Chesley Noon, PTA 04/30/20, 4:35 PM

## 2020-04-30 NOTE — Consult Note (Signed)
[facility] Face-to-Face Psychiatry Consult   Reason for Consult:  Medical decision making Referring Physician: Zhane Patient Identification: Kylie Monroe MRN:  [id number] Principal Diagnosis:   Adjustment disorder  Heroin dependence Marijuana Dependence Past cocaine dependence    Diagnosis:  Active Problems:   Pneumonia due to COVID-19 virus   Anemia   COVID-19   Melena   Total Time spent with patient:  40  min  Subjective:   Kylie Monroe is a 64 y.o. female patient admitted with  multiple medical problems   HPI:  64 year old caucasian female with history of heroin dependence, adjustment issues from medical illness, now recovered and had been having COVID almost ready for discharge.  Wants to go home.    Bereavement over boyfriend one month ago passing away but feels she has general support   Has not had recent Psych admissions, does not feel she needs depression meds but is willing to do bereavement support  There was concern that she may not have --medical capacity ---but her MS seems to have cleared up for now   She wants to go home and have in home services ---for OT PT and to go to outpatient rehab for opioids.  Already has followup for suboxone clinic she says   She understands her illness now, that she had Covid and related complications but has friends, and all who can help look in on her.  She says she has many tasks to take care of at home      Past Psychiatric History:  Past issues and present with heroin use mainly   Risk to Self:  none Risk to Others:  none Prior Inpatient Therapy:  none recently  Prior Outpatient Therapy:   suboxone clinic  Past Medical History:  Past Medical History:  Diagnosis Date  . COPD (chronic obstructive pulmonary disease) ([place])   . Hepatitis C   . Hypertension     Past Surgical History:  Procedure Laterality Date  . CHOLECYSTECTOMY    . prolapsed rectum     Family History:  Family History  Problem Relation Age of  Onset  . Cancer Mother    Family Psychiatric  History:  suboxone treatment  Social History:  Now lives alone, has step daughter, fiance passed away last month --she is holding out but in the meantime got medically ill.  Her MS has cleared where she can make decisions at this time  Social History   Substance and Sexual Activity  Alcohol Use No     Social History   Substance and Sexual Activity  Drug Use Yes  . Types: Cocaine   Comment: valium    Social History   Socioeconomic History  . Marital status: Divorced    Spouse name: Not on file  . Number of children: Not on file  . Years of education: Not on file  . Highest education level: Not on file  Occupational History  . Not on file  Tobacco Use  . Smoking status: Current Every Day Smoker    Packs/day: 1.00    Years: 44.00    Pack years: 44.00    Types: Cigarettes  . Smokeless tobacco: Never Used  Substance and Sexual Activity  . Alcohol use: No  . Drug use: Yes    Types: Cocaine    Comment: valium  . Sexual activity: Not on file  Other Topics Concern  . Not on file  Social History Narrative  . Not on file   Social Determinants  of Health   Financial Resource Strain:   . Difficulty of Paying Living Expenses:   Food Insecurity:   . Worried About Charity fundraiser in the Last Year:   . Arboriculturist in the Last Year:   Transportation Needs:   . Film/video editor (Medical):   Marland Kitchen Lack of Transportation (Non-Medical):   Physical Activity:   . Days of Exercise per Week:   . Minutes of Exercise per Session:   Stress:   . Feeling of Stress :   Social Connections:   . Frequency of Communication with Friends and Family:   . Frequency of Social Gatherings with Friends and Family:   . Attends Religious Services:   . Active Member of Clubs or Organizations:   . Attends Archivist Meetings:   Marland Kitchen Marital Status:    Additional Social History:  Retired now and has disability and Manufacturing systems engineer status.     Allergies:  No Known Allergies  Labs:  Results for orders placed or performed during the hospital encounter of 04/20/20 (from the past 48 hour(s))  Glucose, capillary     Status: Abnormal   Collection Time: 04/28/20  5:37 PM  Result Value Ref Range   Glucose-Capillary 113 (H) 70 - 99 mg/dL    Comment: Glucose reference range applies only to samples taken after fasting for at least 8 hours.  Glucose, capillary     Status: Abnormal   Collection Time: 04/28/20  8:09 PM  Result Value Ref Range   Glucose-Capillary 160 (H) 70 - 99 mg/dL    Comment: Glucose reference range applies only to samples taken after fasting for at least 8 hours.  Glucose, capillary     Status: Abnormal   Collection Time: 04/28/20 11:56 PM  Result Value Ref Range   Glucose-Capillary 103 (H) 70 - 99 mg/dL    Comment: Glucose reference range applies only to samples taken after fasting for at least 8 hours.  Glucose, capillary     Status: None   Collection Time: 04/29/20  3:48 AM  Result Value Ref Range   Glucose-Capillary 84 70 - 99 mg/dL    Comment: Glucose reference range applies only to samples taken after fasting for at least 8 hours.  Basic metabolic panel     Status: Abnormal   Collection Time: 04/29/20  6:41 AM  Result Value Ref Range   Sodium 138 135 - 145 mmol/L   Potassium 3.8 3.5 - 5.1 mmol/L   Chloride 98 98 - 111 mmol/L   CO2 31 22 - 32 mmol/L   Glucose, Bld 73 70 - 99 mg/dL    Comment: Glucose reference range applies only to samples taken after fasting for at least 8 hours.   BUN 16 8 - 23 mg/dL   Creatinine, Ser 0.51 0.44 - 1.00 mg/dL   Calcium 8.1 (L) 8.9 - 10.3 mg/dL   GFR calc non Af Amer >60 >60 mL/min   GFR calc Af Amer >60 >60 mL/min   Anion gap 9 5 - 15    Comment: Performed at [facility], [address] [place], [place] [address]  Magnesium     Status: None   Collection Time: 04/29/20  6:41 AM  Result Value Ref Range   Magnesium 1.8 1.7 - 2.4  mg/dL    Comment: Performed at [facility], [address], [place], [place] [address]  Phosphorus     Status: None   Collection Time:  04/29/20  6:41 AM  Result Value Ref Range   Phosphorus 3.9 2.5 - 4.6 mg/dL    Comment: Performed at [facility], [address] [place], [place] [address]  Glucose, capillary     Status: Abnormal   Collection Time: 04/29/20  8:37 AM  Result Value Ref Range   Glucose-Capillary 62 (L) 70 - 99 mg/dL    Comment: Glucose reference range applies only to samples taken after fasting for at least 8 hours.  Glucose, capillary     Status: None   Collection Time: 04/29/20 12:00 PM  Result Value Ref Range   Glucose-Capillary 88 70 - 99 mg/dL    Comment: Glucose reference range applies only to samples taken after fasting for at least 8 hours.  Glucose, capillary     Status: Abnormal   Collection Time: 04/29/20  4:49 PM  Result Value Ref Range   Glucose-Capillary 159 (H) 70 - 99 mg/dL    Comment: Glucose reference range applies only to samples taken after fasting for at least 8 hours.  Glucose, capillary     Status: Abnormal   Collection Time: 04/29/20  8:26 PM  Result Value Ref Range   Glucose-Capillary 112 (H) 70 - 99 mg/dL    Comment: Glucose reference range applies only to samples taken after fasting for at least 8 hours.  Glucose, capillary     Status: None   Collection Time: 04/30/20 12:34 AM  Result Value Ref Range   Glucose-Capillary 93 70 - 99 mg/dL    Comment: Glucose reference range applies only to samples taken after fasting for at least 8 hours.  Glucose, capillary     Status: Abnormal   Collection Time: 04/30/20  4:24 AM  Result Value Ref Range   Glucose-Capillary 141 (H) 70 - 99 mg/dL    Comment: Glucose reference range applies only to samples taken after fasting for at least 8 hours.  CBC with Differential/Platelet     Status: Abnormal   Collection Time: 04/30/20  6:35 AM  Result Value Ref Range   WBC 6.5 4.0 - 10.5 K/uL    RBC 3.88 3.87 - 5.11 MIL/uL   Hemoglobin 9.6 (L) 12.0 - 15.0 g/dL   HCT 32.7 (L) 36.0 - 46.0 %   MCV 84.3 80.0 - 100.0 fL   MCH 24.7 (L) 26.0 - 34.0 pg   MCHC 29.4 (L) 30.0 - 36.0 g/dL   RDW 23.5 (H) 11.5 - 15.5 %   Platelets 81 (L) 150 - 400 K/uL    Comment: Immature Platelet Fraction may be clinically indicated, consider ordering this additional test [id number]    nRBC 0.0 0.0 - 0.2 %   Neutrophils Relative % 78 %   Neutro Abs 5.1 1.7 - 7.7 K/uL   Lymphocytes Relative 12 %   Lymphs Abs 0.8 0.7 - 4.0 K/uL   Monocytes Relative 7 %   Monocytes Absolute 0.4 0.1 - 1.0 K/uL   Eosinophils Relative 2 %   Eosinophils Absolute 0.1 0.0 - 0.5 K/uL   Basophils Relative 0 %   Basophils Absolute 0.0 0.0 - 0.1 K/uL   WBC Morphology MORPHOLOGY UNREMARKABLE    RBC Morphology MIXED RBC POPULATION    Smear Review Normal platelet morphology    Immature Granulocytes 1 %   Abs Immature Granulocytes 0.04 0.00 - 0.07 K/uL    Comment: Performed at [facility], [address] [place], [place] [address]  Basic metabolic panel     Status: Abnormal  Collection Time: 04/30/20  6:35 AM  Result Value Ref Range   Sodium 139 135 - 145 mmol/L   Potassium 3.6 3.5 - 5.1 mmol/L   Chloride 101 98 - 111 mmol/L   CO2 28 22 - 32 mmol/L   Glucose, Bld 107 (H) 70 - 99 mg/dL    Comment: Glucose reference range applies only to samples taken after fasting for at least 8 hours.   BUN 20 8 - 23 mg/dL   Creatinine, Ser 0.70 0.44 - 1.00 mg/dL   Calcium 8.8 (L) 8.9 - 10.3 mg/dL   GFR calc non Af Amer >60 >60 mL/min   GFR calc Af Amer >60 >60 mL/min   Anion gap 10 5 - 15    Comment: Performed at [facility], [address] [place], [place] [address]  Magnesium     Status: None   Collection Time: 04/30/20  6:35 AM  Result Value Ref Range   Magnesium 2.0 1.7 - 2.4 mg/dL    Comment: Performed at [facility], [address] [place], [place] [address]  Phosphorus     Status: Abnormal    Collection Time: 04/30/20  6:35 AM  Result Value Ref Range   Phosphorus 2.1 (L) 2.5 - 4.6 mg/dL    Comment: Performed at [facility], [address] [place], [place] [address]  Glucose, capillary     Status: Abnormal   Collection Time: 04/30/20  7:52 AM  Result Value Ref Range   Glucose-Capillary 65 (L) 70 - 99 mg/dL    Comment: Glucose reference range applies only to samples taken after fasting for at least 8 hours.  Glucose, capillary     Status: None   Collection Time: 04/30/20 12:19 PM  Result Value Ref Range   Glucose-Capillary 88 70 - 99 mg/dL    Comment: Glucose reference range applies only to samples taken after fasting for at least 8 hours.    Current Facility-Administered Medications  Medication Dose Route Frequency Provider Last Rate Last Admin  . 0.9 %  sodium chloride infusion (Manually program via Guardrails IV Fluids)   Intravenous Once Flora Lipps, MD   Stopped at 04/21/20 1605  . 0.9 %  sodium chloride infusion  250 mL Intravenous Continuous Awilda Bill, NP   Stopped at 04/22/20 0502  . acetaminophen (TYLENOL) tablet 650 mg  650 mg Oral Q6H PRN Max Sane, MD   650 mg at 04/29/20 0359  . albuterol (VENTOLIN HFA) 108 (90 Base) MCG/ACT inhaler 2 puff  2 puff Inhalation Q6H PRN Max Sane, MD      . ascorbic acid (VITAMIN C) tablet 500 mg  500 mg Oral Daily Max Sane, MD   500 mg at 04/28/20 0913  . B-complex with vitamin C tablet 1 tablet  1 tablet Oral Daily Flora Lipps, MD   1 tablet at 04/28/20 0916  . chlorhexidine gluconate (MEDLINE KIT) (PERIDEX) 0.12 % solution 15 mL  15 mL Mouth Rinse BID Max Sane, MD   15 mL at 04/28/20 2055  . Chlorhexidine Gluconate Cloth 2 % PADS 6 each  6 each Topical Daily Lang Snow, NP   6 each at 04/25/20 1107  . chlorpheniramine-HYDROcodone (TUSSIONEX) 10-8 MG/5ML suspension 5 mL  5 mL Oral Q12H PRN Max Sane, MD      . cloNIDine (CATAPRES) tablet 0.1 mg  0.1 mg Oral BID Ottie Glazier, MD   0.1  mg at 04/28/20 2059  . diazepam (VALIUM) tablet 5 mg  5 mg Oral QID Flora Lipps, MD   5 mg at 04/30/20 1303  . feeding supplement (ENSURE ENLIVE) (ENSURE ENLIVE) liquid 237 mL  237 mL Oral TID BM Sharen Hones, MD   237 mL at 04/28/20 2055  . folic acid (FOLVITE) tablet 1 mg  1 mg Oral Daily Awilda Bill, NP   1 mg at 04/28/20 6195  . guaiFENesin-dextromethorphan (ROBITUSSIN DM) 100-10 MG/5ML syrup 10 mL  10 mL Oral Q4H PRN Max Sane, MD      . LORazepam (ATIVAN) injection 1 mg  1 mg Intravenous Q4H PRN Max Sane, MD   1 mg at 04/24/20 1555  . melatonin tablet 10 mg  10 mg Oral QHS Flora Lipps, MD   10 mg at 04/27/20 2159  . ondansetron (ZOFRAN) tablet 4 mg  4 mg Oral Q6H PRN Max Sane, MD       Or  . ondansetron (ZOFRAN) injection 4 mg  4 mg Intravenous Q6H PRN Max Sane, MD      . pantoprazole (PROTONIX) EC tablet 40 mg  40 mg Oral BID Sharen Hones, MD   40 mg at 04/28/20 2102  . PHENobarbital (LUMINAL) tablet 16.2 mg  16.2 mg Oral BID Ottie Glazier, MD   16.2 mg at 04/28/20 0916  . sodium phosphate 20 mmol in dextrose 5 % 250 mL infusion  20 mmol Intravenous Once Sharen Hones, MD 43 mL/hr at 04/30/20 1306 20 mmol at 04/30/20 1306  . thiamine tablet 100 mg  100 mg Oral Daily Awilda Bill, NP   100 mg at 04/28/20 0932  . zinc sulfate capsule 220 mg  220 mg Oral Daily Max Sane, MD   220 mg at 04/28/20 0916  . ziprasidone (GEODON) injection 20 mg  20 mg Intramuscular BID PRN Awilda Bill, NP   20 mg at 04/29/20 1441    Musculoskeletal: Strength & Muscle Tone: improving   Gait & Station: she is able  to ambulate    Psychiatric Specialty Exam: Physical Exam  Review of Systems  Blood pressure 116/81, pulse (!) 102, temperature 98.5 F (36.9 C), resp. rate 17, height 5' 2"  (1.575 m), weight 62.3 kg, SpO2 94 %.Body mass index is 25.12 kg/m.      Appearance --haggard, sickly --but alert cooperative oriented to person place date and time, not clouded or fluctuant  mood somewhat depressed affect normal  Consciousness not clouded or fluctuant ---no frank speech problems--normal rate tone volume fluency---SI and HI no active SI and Hi contracts for safety.  Judgement insight reliability  Improving now --she understands the nature of her illness and the implications if discharged, she acknowledges her current treatment has helped her.  Memory remote recent immediate generally intact through various questions ----in interview, abstraction normal  Intelligence fund of knowledge below average to average --no tics or movement problems rapport fair ----concentration and attention normal for now                                                        Treatment Plan Summary:  patient to go home today with services planned at home.  She has follow up for suboxone and deciding whether to continue that situation.   She does not meet IVC criteria nor holding her for capacity issues as she has a basic understanding.  Step daughter may want further services but does not meet qualifications at this time.     Disposition:   Home when medically stable possibly today or soon   Dr. Roosevelt Locks notified  CW Barnett Applebaum discussed and notified   She wants to wait on any extra or new psychiatric medicines.  Eulas Post, MD 04/30/2020 4:06 PM

## 2020-04-30 NOTE — Discharge Summary (Signed)
Physician Discharge Summary  Patient ID: Kylie Monroe MRN: [id number] DOB/AGE: 02/17/1956 64 y.o.  Admit date: 04/20/2020 Discharge date: 04/30/2020  Admission Diagnoses:  Discharge Diagnoses:  Active Problems:   Pneumonia due to COVID-19 virus   Anemia   COVID-19   Melena   Discharged Condition: good  Hospital Course:  Patient is a 64 year old female with history of COPD, protein calorie malnutrition, hepatitis C, cirrhosis with splenomegaly,portal hypertension, essential hypertension and IV drug abuse who was initially admitted to the hospital on 5/14 with diagnosis of Covid pneumonia,severe anemia with hemoglobin 2.3. Her last use of cocaine was before admission. Chest x-ray at admission showed multifocal pneumonia, more on the right side. She is managed in ICU, she was also seen by gastroenterology, octreotide was started due to concern for upper GI bleed with portal hypertension. EGD was deferred until patient recovered from her pneumonia. Patient will transfer out of ICU on 5/19. Octreotide was discontinued before transfer, hemoglobin has been stable for a few days. Her initial blood culture positive for coag negative staph in 1 out of 2 bottles. Appears to be skin flora. Patient also had a severe acute metabolic encephalopathy, was initially treated with Precedex. Patient was called using cocaine in her room upon transfer to medical floor on 04/25/2020.  5/24.  Patient condition had improved.    Psychiatry evaluation deemed the patient has decision-making capacity.  #1.  Acute hypoxemic restaurant failure secondary to COVID-19 pneumonia. Condition had improved.  Patient is off oxygen.  She did not require any oxygen overnight.  2.  Acute on chronic blood loss anemia. Hemoglobin stable, will schedule GI follow-up.  3.  Sepsis. Condition has resolved.  4.  Polysubstance abuse with agitation. Patient condition improved.    Patient deemed to have making  capacity.  5.  Moderate protein calorie malnutrition. Encourage protein intake.  6.  Diarrhea.  Condition improved.  7.  Hypokalemia and hypophosphatemia.   Condition improved.  Will prescribe 5 days Neutra-Phos supplement. Consults:  Psychology GI Pulmonology   Significant Diagnostic Studies:  CHEST - 2 VIEW  COMPARISON:  Apr 24, 2020  FINDINGS: There is interstitial thickening and areas of patchy airspace opacity bilaterally with patchy airspace opacity primarily in the lung bases. There appears to be resolution of previous small right pleural effusion. No new opacities are evident.  Heart size is upper normal with pulmonary vascularity normal. No adenopathy. Bones are osteoporotic. Old healed fracture lateral left clavicle noted.  IMPRESSION: Ill-defined interstitial thickening and patchy airspace opacity, likely multifocal pneumonia. Suspect atypical organism pneumonia given clinical history. Compared to 2 days prior, no new opacity evident. Slight clearing right upper lobe and apparent resolution of small right pleural effusion.  Heart upper normal in size. No adenopathy evident. Bones osteoporotic.   Electronically Signed   By: Lowella Grip III M.D.   On: 04/26/2020 15:58    Treatments: Treated for Covid.  Octreotide for GI bleed.  Discharge Exam: Blood pressure 116/81, pulse (!) 102, temperature 98.5 F (36.9 C), resp. rate 17, height 5\' 2"  (1.575 m), weight 62.3 kg, SpO2 94 %. General appearance: alert and cooperative Resp: clear to auscultation bilaterally Cardio: regular rate and rhythm, S1, S2 normal, no murmur, click, rub or gallop GI: soft, non-tender; bowel sounds normal; no masses,  no organomegaly Extremities: extremities normal, atraumatic, no cyanosis or edema  Disposition: Discharge disposition: 01-Home or Self Care       Discharge Instructions    Diet - low sodium heart healthy  Complete by: As directed     Increase activity slowly   Complete by: As directed      Allergies as of 04/30/2020   No Known Allergies     Medication List    TAKE these medications   ascorbic acid 500 MG tablet Commonly known as: VITAMIN C Take 1 tablet (500 mg total) by mouth daily. Start taking on: May 01, 2020   pantoprazole 40 MG tablet Commonly known as: PROTONIX Take 1 tablet (40 mg total) by mouth 2 (two) times daily.   thiamine 100 MG tablet Take 1 tablet (100 mg total) by mouth daily. Start taking on: May 01, 2020   zinc sulfate 220 (50 Zn) MG capsule Take 1 capsule (220 mg total) by mouth daily. Start taking on: May 01, 2020      Follow-up Information    Eula Flax, NP Follow up.   Specialty: Family Medicine Why: Please make PCP appointment for within 5-7 days of discharge date. Contact information: .   [place] [address] [phone]        Midge Minium, MD Follow up in 2 week(s).   Specialty: Gastroenterology Contact information: [address] [place]  [place] [address] [phone]         35 minutes  Signed: Marrion Coy 04/30/2020, 4:38 PM

## 2020-04-30 NOTE — TOC Transition Note (Signed)
Transition of Care [facility]) - CM/SW Discharge Note   Patient Details  Name: Kylie Monroe MRN: [id number] Date of Birth: 08-11-1956  [facility] CM/SW Contact:  Allayne Butcher, RN Phone Number: 04/30/2020, 4:29 PM   Clinical Narrative:    Patient has been cleared by psychiatry and is determined to have medical capacity.  Patient wants to go home and has told the nurse that she already has transportation arranged.  RNCM unable to find home health agency to accept patient with her Medicaid.     Final next level of care: Home/Self Care Barriers to Discharge: Barriers Resolved   Patient Goals and CMS Choice Patient states their goals for this hospitalization and ongoing recovery are:: wants to go home CMS Medicare.gov Compare Post Acute Care list provided to:: Patient Represenative (must comment) Choice offered to / list presented to : Adult Children  Discharge Placement                       Discharge Plan and Services                                     Social Determinants of Health (SDOH) Interventions     Readmission Risk Interventions Readmission Risk Prevention Plan 04/25/2020  Transportation Screening Complete  PCP or Specialist Appt within 3-5 Days Complete  HRI or Home Care Consult Complete  Medication Review (RN Care Manager) Complete  Some recent data might be hidden

## 2020-05-01 LAB — CULTURE, BLOOD (ROUTINE X 2)
Culture: NO GROWTH
Culture: NO GROWTH

## 2020-06-15 ENCOUNTER — Other Ambulatory Visit: Payer: Self-pay | Admitting: Registered Nurse

## 2020-06-15 DIAGNOSIS — R600 Localized edema: Secondary | ICD-10-CM

## 2020-07-09 ENCOUNTER — Ambulatory Visit: Payer: Medicaid Other | Attending: Registered Nurse

## 2020-07-17 ENCOUNTER — Ambulatory Visit: Payer: Medicaid Other

## 2020-07-24 ENCOUNTER — Ambulatory Visit: Payer: Medicaid Other | Attending: Registered Nurse

## 2021-10-08 IMAGING — DX DG CHEST 1V PORT
1 series · 1 of 1 positions shown · non-contrast
Comparison: 04/20/2020

CLINICAL DATA: COVID positive, abnormal chest x-ray

EXAM:
PORTABLE CHEST 1 VIEW

[chest ap]
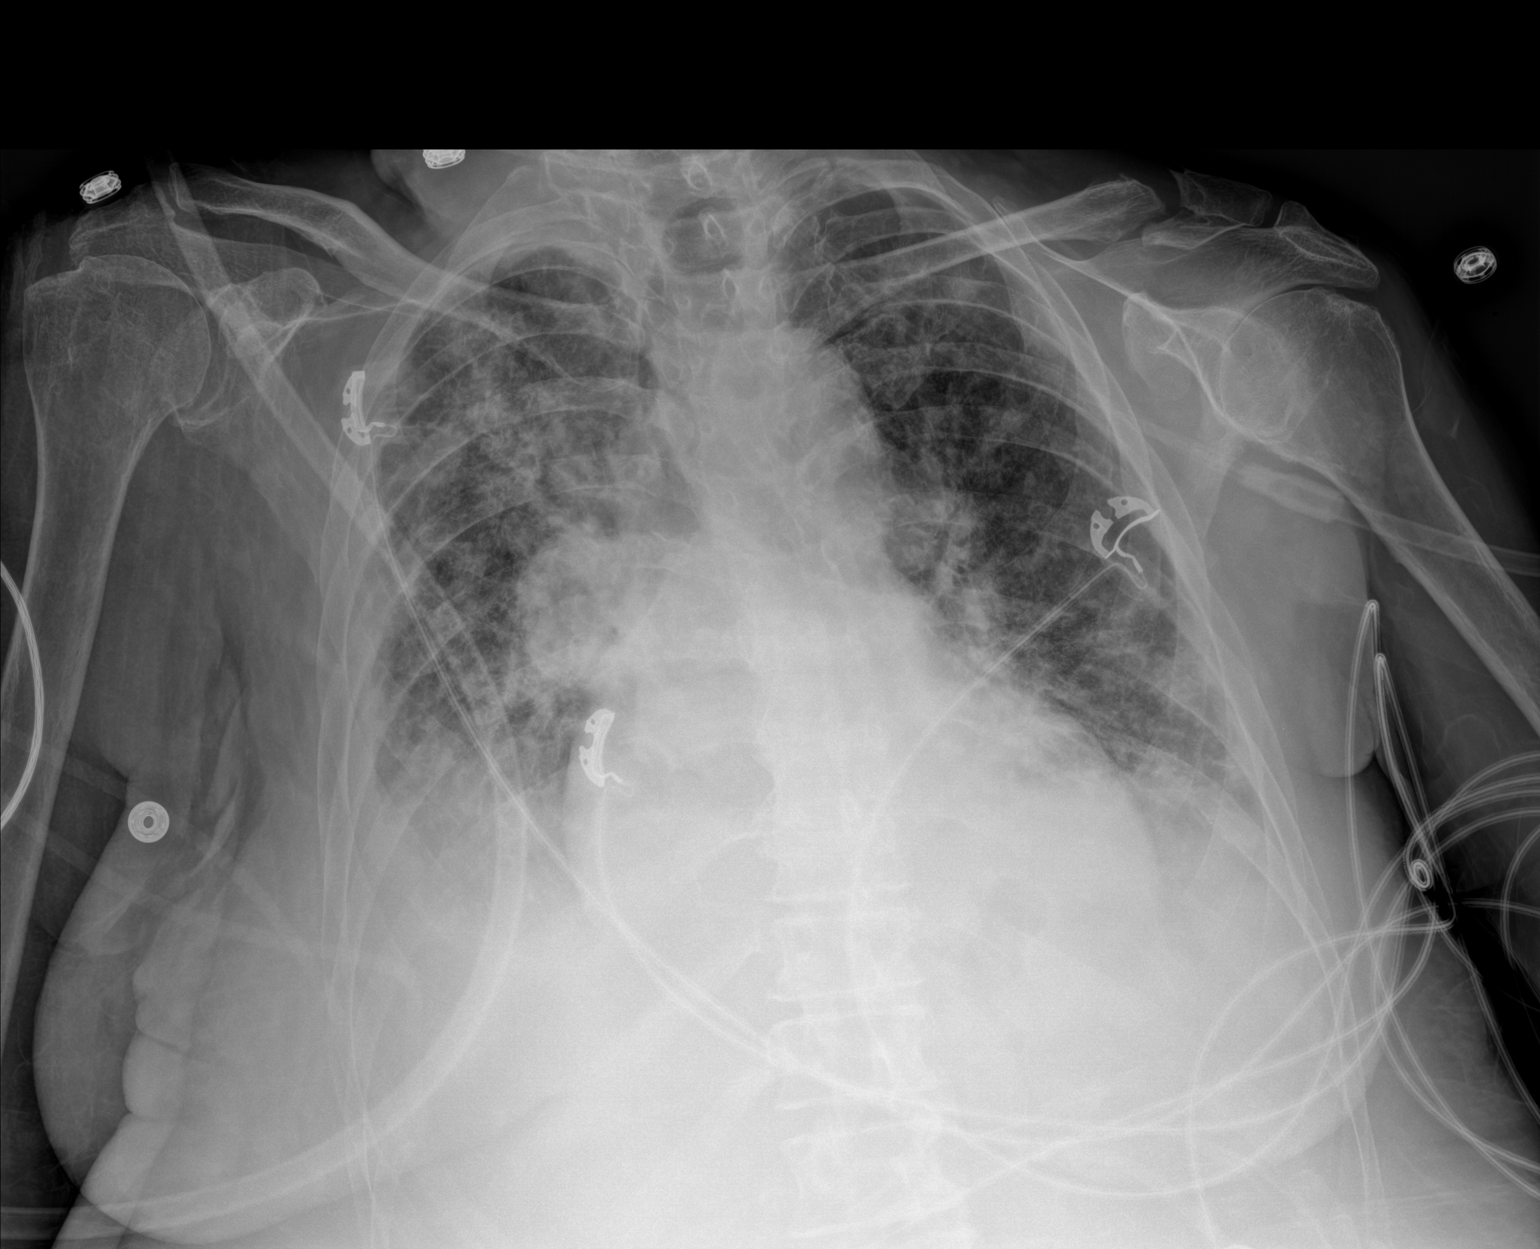

[1 of 1 positions shown; findings below may reference images not displayed]

FINDINGS: Persistent bilateral opacities with worsening lung aeration
particularly at the right lung base. There is masslike right
perihilar consolidation. Possible small pleural effusions. No
pneumothorax. Stable cardiomediastinal contours.
IMPRESSION: Persistent bilateral pulmonary opacities with worsening aeration.
Possible small pleural effusions.

## 2022-11-20 ENCOUNTER — Emergency Department
Admission: EM | Admit: 2022-11-20 | Discharge: 2022-11-21 | Disposition: A | Discharge status: Home / Self Care | Payer: Medicaid Other | Attending: Emergency Medicine | Admitting: Emergency Medicine

## 2022-11-20 ENCOUNTER — Emergency Department: Payer: Medicaid Other

## 2022-11-20 ENCOUNTER — Encounter: Payer: Self-pay | Admitting: Hematology and Oncology

## 2022-11-20 ENCOUNTER — Other Ambulatory Visit: Payer: Self-pay

## 2022-11-20 DIAGNOSIS — Z20822 Contact with and (suspected) exposure to covid-19: Secondary | ICD-10-CM | POA: Insufficient documentation

## 2022-11-20 DIAGNOSIS — Z139 Encounter for screening, unspecified: Secondary | ICD-10-CM | POA: Diagnosis not present

## 2022-11-20 DIAGNOSIS — R059 Cough, unspecified: Secondary | ICD-10-CM | POA: Insufficient documentation

## 2022-11-20 DIAGNOSIS — Y92410 Unspecified street and highway as the place of occurrence of the external cause: Secondary | ICD-10-CM | POA: Insufficient documentation

## 2022-11-20 DIAGNOSIS — R519 Headache, unspecified: Secondary | ICD-10-CM | POA: Diagnosis not present

## 2022-11-20 DIAGNOSIS — M21339 Wrist drop, unspecified wrist: Secondary | ICD-10-CM

## 2022-11-20 DIAGNOSIS — Z8616 Personal history of COVID-19: Secondary | ICD-10-CM | POA: Insufficient documentation

## 2022-11-20 DIAGNOSIS — S139XXA Sprain of joints and ligaments of unspecified parts of neck, initial encounter: Secondary | ICD-10-CM

## 2022-11-20 LAB — RESP PANEL BY RT-PCR (RSV, FLU A&B, COVID)  RVPGX2
Influenza A by PCR: NEGATIVE
Influenza B by PCR: NEGATIVE
Resp Syncytial Virus by PCR: NEGATIVE
SARS Coronavirus 2 by RT PCR: NEGATIVE

## 2022-11-20 MED ORDER — PANTOPRAZOLE SODIUM 40 MG PO TBEC
40.0000 mg | DELAYED_RELEASE_TABLET | Freq: Two times a day (BID) | ORAL | Status: DC
  Administered 2022-11-20: 40 mg via ORAL
  Filled 2022-11-20: qty 1

## 2022-11-20 NOTE — ED Notes (Signed)
PT refusing to sigh D/C at this time.  Verbally threatening staff.

## 2022-11-20 NOTE — ED Notes (Signed)
2nd message left with pt legal guardian Oda Cogan.  Phone goes straight to VM.

## 2022-11-20 NOTE — ED Notes (Addendum)
This Rn took over at 3pm. This patient had been discharged to the lobby this morning and legal guardian had been contacted and was scheduled to pick her up. She has not showed up all day and her phone has been turned off. Patient has been very boisterous all afternoon. This RN fed her a [place] sandwich tray. Pt has been self medicating as well. Pt showed this RN her suboxone package that she was taking. Pt was just now placed back on the ED track board.

## 2022-11-20 NOTE — ED Notes (Signed)
Pt moved from chair to stretcher for comfort, assisted pt to toilet, pt pants wet, changed of pants and meshed underwear given to pt. Pt given meal tray as she reports is hungry, laid beside her on stretcher as pt laid down to rest, NAD noted, observed even RR and unlabored, side rails up x2 for safety, pt expresses no other needs or concerns at this time, pt within view of staff for safety monitoring, no further concerns as of present.

## 2022-11-20 NOTE — ED Notes (Signed)
Pt Legal Guardian Kylie Monroe contacted at [phone].  HIPAA compliant VM left.

## 2022-11-20 NOTE — ED Triage Notes (Signed)
1315  This RN attempted to reach out to Tonya (LG) due to pt still being here after our conversation this morning, phone is not ringing and multiple VM's have been left by prior staff.

## 2022-11-20 NOTE — ED Notes (Addendum)
Pt daughter, [name], called and was transferred to this nurses phone in reference to pt. When nurse answers phone Tonya immediately states, " I am Kylie Monroe's daughter and I live out of town and I cannot come and pick her up." This nurse asks for daughter to hold for one moment so that correct nurse could speak with her. She tells this nurse that she does not have time to wait and again will not pick the pt up. She then states that her other sister does not drive. This nurse again asks for one moment as this nurse is familiar with pt case. Pt daughter states that she is at work and does not understand why this nurse cannot write this down and share with others. This nurse attempted to educated on need to speak to person familiar with situation as this nurse does not know pt or situation. She again states she will not pick up the pt and does not understand why PD dropped her off and will not take her home and she doe not have time to wait for this nurse. This nurse again asks for one moment and goes to take phone to nurse in charge of pt and at this time she hangs up on nurse. Charge RN, Morrie Sheldon and Dr. Lenard Lance are notified of situation.

## 2022-11-20 NOTE — ED Notes (Signed)
1740  Pt's legal guardian [name]) called back and this RN spoke to her and requested that she comes and picks up her mother, [name] states "why did they bring her there and not home", pt educated I was not here when pt had arrived so I could not answer to that at this time.  [name] was asked to please come pick up pt due to [place] not being able to D/C due to her being the legal guardian of this pt, [name] states "Well I live in [place] but I will come get her"

## 2022-11-20 NOTE — ED Notes (Signed)
[place] [employer] in room at this time.  Refusing to transport pt.

## 2022-11-20 NOTE — ED Provider Notes (Signed)
-----------------------------------------   7:40 PM on 11/20/2022 ----------------------------------------- Patient is clear for discharge home.  We were unable to reach the patient's guardian however we have reached the patient's daughter but she states she is at work and is out of town and cannot pick up the patient hung up on the nurse.  We are continuing to try to reach a family member for the patient to bring her home.  We will continue to monitor the patient here until she has a safe disposition.   Minna Antis, MD 11/20/22 1941

## 2022-11-20 NOTE — ED Notes (Signed)
Per Xray, pt refused procedure.
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
## 2022-11-20 NOTE — ED Notes (Addendum)
6734 Daughter called back to our phone number, heard it was ED. Phone is now off going straight to VM. RN Adelina Mings left VM. Other contact, friend, called VM left to contact unit. Pt's other daughter has non working number. Spoke with Consulting civil engineer, PD called to locate legal guardian for pt pick up.

## 2022-11-21 ENCOUNTER — Emergency Department: Payer: Medicaid Other

## 2022-11-21 LAB — COMPREHENSIVE METABOLIC PANEL
ALT: 12 U/L (ref 0–44)
AST: 21 U/L (ref 15–41)
Albumin: 3.3 g/dL — ABNORMAL LOW (ref 3.5–5.0)
Alkaline Phosphatase: 66 U/L (ref 38–126)
Anion gap: 4 — ABNORMAL LOW (ref 5–15)
BUN: 15 mg/dL (ref 8–23)
CO2: 28 mmol/L (ref 22–32)
Calcium: 8.6 mg/dL — ABNORMAL LOW (ref 8.9–10.3)
Chloride: 107 mmol/L (ref 98–111)
Creatinine, Ser: 0.75 mg/dL (ref 0.44–1.00)
GFR, Estimated: >60 mL/min (ref >60)
Glucose, Bld: 94 mg/dL (ref 70–99)
Potassium: 4.1 mmol/L (ref 3.5–5.1)
Sodium: 139 mmol/L (ref 135–145)
Total Bilirubin: 0.7 mg/dL (ref 0.3–1.2)
Total Protein: 6.6 g/dL (ref 6.5–8.1)

## 2022-11-21 LAB — CBC WITH DIFFERENTIAL/PLATELET
Abs Immature Granulocytes: 0 10*3/uL (ref 0.00–0.07)
Basophils Absolute: 0 10*3/uL (ref 0.0–0.1)
Basophils Relative: 0 %
Eosinophils Absolute: 0.1 10*3/uL (ref 0.0–0.5)
Eosinophils Relative: 3 %
HCT: 40.5 % (ref 36.0–46.0)
Hemoglobin: 12.8 g/dL (ref 12.0–15.0)
Immature Granulocytes: 0 %
Lymphocytes Relative: 18 %
Lymphs Abs: 0.7 10*3/uL (ref 0.7–4.0)
MCH: 30.2 pg (ref 26.0–34.0)
MCHC: 31.6 g/dL (ref 30.0–36.0)
MCV: 95.5 fL (ref 80.0–100.0)
Monocytes Absolute: 0.3 10*3/uL (ref 0.1–1.0)
Monocytes Relative: 7 %
Neutro Abs: 2.8 10*3/uL (ref 1.7–7.7)
Neutrophils Relative %: 72 %
Platelets: 79 10*3/uL — ABNORMAL LOW (ref 150–400)
RBC: 4.24 MIL/uL (ref 3.87–5.11)
RDW: 16 % — ABNORMAL HIGH (ref 11.5–15.5)
WBC: 3.9 10*3/uL — ABNORMAL LOW (ref 4.0–10.5)
nRBC: 0 % (ref 0.0–0.2)

## 2022-11-21 LAB — TROPONIN I (HIGH SENSITIVITY): Troponin I (High Sensitivity): 3 ng/L (ref <18)

## 2022-11-21 LAB — LIPASE, BLOOD: Lipase: 57 U/L — ABNORMAL HIGH (ref 11–51)

## 2022-11-21 MED ORDER — ACETAMINOPHEN 500 MG PO TABS
1000.0000 mg | ORAL_TABLET | Freq: Once | ORAL | Status: AC
  Administered 2022-11-21: 1000 mg via ORAL
  Filled 2022-11-21: qty 2

## 2022-11-21 MED ORDER — BUPRENORPHINE HCL-NALOXONE HCL 8-2 MG SL SUBL
1.0000 | SUBLINGUAL_TABLET | Freq: Every day | SUBLINGUAL | Status: DC
  Administered 2022-11-21: 1 via SUBLINGUAL
  Filled 2022-11-21: qty 1

## 2022-11-21 MED ORDER — GABAPENTIN 300 MG PO CAPS
300.0000 mg | ORAL_CAPSULE | Freq: Once | ORAL | Status: AC
  Administered 2022-11-21: 300 mg via ORAL
  Filled 2022-11-21: qty 1

## 2022-11-21 NOTE — ED Provider Notes (Signed)
-----------------------------------------   6:37 AM on 11/21/2022 -----------------------------------------   Blood pressure 98/64, pulse 70, temperature 98.2 F (36.8 C), temperature source Oral, resp. rate 16, height 5\' 2"  (1.575 m), weight 54.4 kg, SpO2 98 %.  The patient is calm and cooperative at this time.  There have been no acute events since the last update.  Awaiting disposition plan from case management/social work.    Aalijah Mims, , DO 11/21/22 [phone]

## 2022-11-21 NOTE — TOC Progression Note (Signed)
Transition of Care [facility]) - Progression Note    Patient Details  Name: Kylie Monroe MRN: [id number] Date of Birth: Apr 01, 1956  Transition of Care [facility]) CM/SW Contact  Allayne Butcher, RN Phone Number: 11/21/2022, 10:28 AM  Clinical Narrative:    Updated patient's address she no longer lives in [place] but is now in [place].    Expected Discharge Plan: Home/Self Care Barriers to Discharge: Continued Medical Work up  Expected Discharge Plan and Services Expected Discharge Plan: Home/Self Care   Discharge Planning Services: CM Consult                     DME Arranged: N/A         HH Arranged: NA           Social Determinants of Health (SDOH) Interventions    Readmission Risk Interventions    04/25/2020   12:36 PM  Readmission Risk Prevention Plan  Transportation Screening Complete  PCP or Specialist Appt within 3-5 Days Complete  HRI or Home Care Consult Complete  Medication Review (RN Care Manager) Complete
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
## 2022-11-21 NOTE — ED Notes (Signed)
Pt appears to be sleeping, observe even RR and unlabored, NAD noted, side rails up x2 for safety, pt remains in hall bed within view of staff for safety monitoring, no further concerns as of present.
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
## 2022-11-21 NOTE — Discharge Instructions (Addendum)
The MRI of your neck does not show any fracture.  There is some evidence of strain of the ligaments in the neck.  I discussed you with the neurosurgeon in detail.  He wants you to wear the cervical collar constantly.  If you hold your neck still you can take it off briefly to clean underneath of it then put it back on immediately.  I want to see you in clinic in 4 to 6 weeks to see how you are doing and how the ligament is healing.  Because you have developed a wrist drop but not new I want you also to follow-up with neurology.  I put Dr. Sherryll Burger and Dr. Daisy Blossom names and contact information in the computer for you.  Please give their office a call to arrange follow-up.  Should be out of see you fairly soon.  Please return for worsening pain or any different or new findings.  Please also follow-up with your primary care doctor and continue your medications.
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
## 2022-11-21 NOTE — ED Notes (Signed)
This RN placed mi[place] J c-collar on pt, gave pt education on this and she verbalized understanding.   Pt reports to this RN and MD that she is not able to flex her R wrist, pt also states sensation is different in her R hand than her L hand. Pt states L hand is more sensitive than the R.

## 2022-11-21 NOTE — ED Provider Notes (Addendum)
Patient is getting little agitated wants her Suboxone and gabapentin which have just ordered.  We are waiting on the results of the MRI of the neck at this point.  Patient is supposed to also get an x-ray of the wrist.   Arnaldo Natal, MD 11/21/22 1642 X-rays have come back showing no fracture MRI shows no fracture back either.  Discussed MRI findings and the rest the findings in detail with neurosurgery.  They recommend in view of the apparent ligamentous strain have the patient remain in a c-collar follow-up in clinic in 4 to 6 weeks for flexion-extension films and of course return if there is any worsening.  Will also have her follow-up with neurology and orthopedics to see if they can discover what is going on with the wrist drop.   Arnaldo Natal, MD 11/21/22 [phone]

## 2022-11-21 NOTE — ED Notes (Signed)
Safe transport OTW to transport pt home.

## 2022-11-21 NOTE — ED Notes (Signed)
Pt at MRI

## 2022-11-21 NOTE — ED Notes (Signed)
Pt continues to rest, intermittent periods of awake, pt can be observed laying then sitting up with eyes closed hunched over, occasionally can be seen picking at her linens, otherwise can observe even RR and unlabored, NAD noted, side rails up x2 for safety, pt remains in hall bed within view of staff for safety monitoring, no further concerns as of present.

## 2022-11-26 ENCOUNTER — Encounter: Payer: Self-pay | Admitting: Hematology and Oncology

## 2022-12-09 ENCOUNTER — Telehealth: Payer: Self-pay | Admitting: Neurosurgery

## 2022-12-09 NOTE — Telephone Encounter (Signed)
Patient has been left several message since 11/21/2022 to contact the office for a hospital follow-up appt. As of today patient has not returned call to the office.

## 2022-12-09 NOTE — Telephone Encounter (Signed)
-----   Message from Peggyann Shoals sent at 12/09/2022 10:56 AM EST ----- Regarding: [facility] ER fu Meade Maw, MD   Hospital [address] for any of [place] in about 4 weeks with flexion and extension xrays of C spine       ----- Message ----- From: Vladimir Crofts, MD Sent: 11/21/2022  10:52 PM EST To: Meade Maw, MD
